# Patient Record
Sex: Female | Born: 1945 | Race: White | Hispanic: No | Smoking: Former smoker
Health system: Southern US, Community
[De-identification: ages and names within clinical notes are randomized; demographics above are authoritative.]

## PROBLEM LIST (undated history)

## (undated) ENCOUNTER — Emergency Department (HOSPITAL_COMMUNITY): Discharge status: Absent without leave | Payer: Medicare Other

## (undated) DIAGNOSIS — T8859XA Other complications of anesthesia, initial encounter: Secondary | ICD-10-CM

## (undated) DIAGNOSIS — Z9889 Other specified postprocedural states: Secondary | ICD-10-CM

## (undated) DIAGNOSIS — E039 Hypothyroidism, unspecified: Secondary | ICD-10-CM

## (undated) DIAGNOSIS — F329 Major depressive disorder, single episode, unspecified: Secondary | ICD-10-CM

## (undated) DIAGNOSIS — T4145XA Adverse effect of unspecified anesthetic, initial encounter: Secondary | ICD-10-CM

## (undated) DIAGNOSIS — F32A Depression, unspecified: Secondary | ICD-10-CM

## (undated) DIAGNOSIS — J45991 Cough variant asthma: Principal | ICD-10-CM

## (undated) DIAGNOSIS — M869 Osteomyelitis, unspecified: Secondary | ICD-10-CM

## (undated) DIAGNOSIS — M1990 Unspecified osteoarthritis, unspecified site: Secondary | ICD-10-CM

## (undated) DIAGNOSIS — R112 Nausea with vomiting, unspecified: Secondary | ICD-10-CM

## (undated) HISTORY — DX: Cough variant asthma: J45.991

## (undated) HISTORY — PX: HAND SURGERY: SHX662

---
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
## 1978-07-13 HISTORY — PX: TUBAL LIGATION: SHX77

## 1999-05-13 ENCOUNTER — Other Ambulatory Visit: Admission: RE | Admit: 1999-05-13 | Discharge: 1999-05-13 | Payer: Self-pay | Admitting: Obstetrics and Gynecology

## 2001-09-19 ENCOUNTER — Encounter: Payer: Self-pay | Admitting: Family Medicine

## 2001-09-19 ENCOUNTER — Encounter: Admission: RE | Admit: 2001-09-19 | Discharge: 2001-09-19 | Payer: Self-pay | Admitting: Family Medicine

## 2001-12-05 ENCOUNTER — Encounter: Admission: RE | Admit: 2001-12-05 | Discharge: 2001-12-05 | Payer: Self-pay | Admitting: Family Medicine

## 2001-12-05 ENCOUNTER — Encounter: Payer: Self-pay | Admitting: Family Medicine

## 2001-12-15 ENCOUNTER — Encounter: Admission: RE | Admit: 2001-12-15 | Discharge: 2001-12-15 | Payer: Self-pay | Admitting: Family Medicine

## 2001-12-15 ENCOUNTER — Encounter: Payer: Self-pay | Admitting: Family Medicine

## 2003-10-08 ENCOUNTER — Emergency Department (HOSPITAL_COMMUNITY): Admission: EM | Admit: 2003-10-08 | Discharge: 2003-10-08 | Payer: Self-pay | Admitting: Emergency Medicine

## 2003-10-09 ENCOUNTER — Emergency Department (HOSPITAL_COMMUNITY): Admission: EM | Admit: 2003-10-09 | Discharge: 2003-10-09 | Payer: Self-pay

## 2003-10-19 ENCOUNTER — Emergency Department (HOSPITAL_COMMUNITY): Admission: EM | Admit: 2003-10-19 | Discharge: 2003-10-19 | Payer: Self-pay | Admitting: Emergency Medicine

## 2003-10-30 ENCOUNTER — Encounter: Admission: RE | Admit: 2003-10-30 | Discharge: 2003-10-30 | Payer: Self-pay | Admitting: Family Medicine

## 2003-12-12 ENCOUNTER — Emergency Department (HOSPITAL_COMMUNITY): Admission: EM | Admit: 2003-12-12 | Discharge: 2003-12-12 | Payer: Self-pay | Admitting: Emergency Medicine

## 2003-12-14 ENCOUNTER — Inpatient Hospital Stay (HOSPITAL_COMMUNITY): Admission: AD | Admit: 2003-12-14 | Discharge: 2004-01-02 | Payer: Self-pay | Admitting: Family Medicine

## 2004-05-15 ENCOUNTER — Ambulatory Visit: Payer: Self-pay | Admitting: Cardiology

## 2004-06-13 ENCOUNTER — Ambulatory Visit: Payer: Self-pay | Admitting: Cardiology

## 2004-07-03 ENCOUNTER — Ambulatory Visit: Payer: Self-pay | Admitting: Cardiology

## 2004-07-31 ENCOUNTER — Ambulatory Visit: Payer: Self-pay | Admitting: Cardiology

## 2004-09-11 ENCOUNTER — Ambulatory Visit: Payer: Self-pay | Admitting: Cardiology

## 2004-11-06 ENCOUNTER — Ambulatory Visit: Payer: Self-pay | Admitting: Cardiology

## 2004-11-24 ENCOUNTER — Ambulatory Visit: Payer: Self-pay | Admitting: Cardiology

## 2005-02-24 ENCOUNTER — Ambulatory Visit: Payer: Self-pay | Admitting: Cardiology

## 2005-03-09 ENCOUNTER — Ambulatory Visit: Payer: Self-pay

## 2005-07-23 ENCOUNTER — Other Ambulatory Visit: Admission: RE | Admit: 2005-07-23 | Discharge: 2005-07-23 | Payer: Self-pay | Admitting: Obstetrics and Gynecology

## 2005-09-24 ENCOUNTER — Ambulatory Visit: Payer: Self-pay | Admitting: Cardiology

## 2005-10-30 ENCOUNTER — Encounter: Admission: RE | Admit: 2005-10-30 | Discharge: 2005-10-30 | Payer: Self-pay | Admitting: Family Medicine

## 2010-10-24 ENCOUNTER — Emergency Department (HOSPITAL_COMMUNITY): Payer: 59

## 2010-10-24 ENCOUNTER — Inpatient Hospital Stay (HOSPITAL_COMMUNITY)
Admission: EM | Admit: 2010-10-24 | Discharge: 2010-10-26 | DRG: 195 | Disposition: A | Discharge status: Home / Self Care | Payer: 59 | Attending: Internal Medicine | Admitting: Internal Medicine

## 2010-10-24 DIAGNOSIS — E039 Hypothyroidism, unspecified: Secondary | ICD-10-CM | POA: Diagnosis present

## 2010-10-24 DIAGNOSIS — A088 Other specified intestinal infections: Secondary | ICD-10-CM | POA: Diagnosis present

## 2010-10-24 DIAGNOSIS — J189 Pneumonia, unspecified organism: Principal | ICD-10-CM | POA: Diagnosis present

## 2010-10-24 DIAGNOSIS — E876 Hypokalemia: Secondary | ICD-10-CM | POA: Diagnosis present

## 2010-10-24 DIAGNOSIS — Z8701 Personal history of pneumonia (recurrent): Secondary | ICD-10-CM

## 2010-10-24 DIAGNOSIS — M069 Rheumatoid arthritis, unspecified: Secondary | ICD-10-CM | POA: Diagnosis present

## 2010-10-24 DIAGNOSIS — F411 Generalized anxiety disorder: Secondary | ICD-10-CM | POA: Diagnosis present

## 2010-10-24 DIAGNOSIS — R1013 Epigastric pain: Secondary | ICD-10-CM | POA: Diagnosis present

## 2010-10-24 LAB — CBC
Hemoglobin: 14 g/dL (ref 12.0–15.0)
MCH: 31.3 pg (ref 26.0–34.0)
Platelets: 150 10*3/uL (ref 150–400)
RBC: 4.48 MIL/uL (ref 3.87–5.11)
WBC: 11.8 10*3/uL — ABNORMAL HIGH (ref 4.0–10.5)

## 2010-10-24 LAB — DIFFERENTIAL
Basophils Absolute: 0 10*3/uL (ref 0.0–0.1)
Eosinophils Relative: 0 % (ref 0–5)
Lymphs Abs: 0.9 10*3/uL (ref 0.7–4.0)
Monocytes Absolute: 0.3 10*3/uL (ref 0.1–1.0)

## 2010-10-24 LAB — COMPREHENSIVE METABOLIC PANEL
ALT: 26 U/L (ref 0–35)
AST: 23 U/L (ref 0–37)
Albumin: 4.3 g/dL (ref 3.5–5.2)
Alkaline Phosphatase: 43 U/L (ref 39–117)
Calcium: 9.5 mg/dL (ref 8.4–10.5)
GFR calc Af Amer: >60 mL/min (ref >60)
Potassium: 3.6 mEq/L (ref 3.5–5.1)
Sodium: 138 mEq/L (ref 135–145)
Total Protein: 7.8 g/dL (ref 6.0–8.3)

## 2010-10-24 LAB — URINALYSIS, ROUTINE W REFLEX MICROSCOPIC
Glucose, UA: NEGATIVE mg/dL
Nitrite: NEGATIVE
Specific Gravity, Urine: 1.016 (ref 1.005–1.030)
pH: 7 (ref 5.0–8.0)

## 2010-10-24 LAB — LIPID PANEL
Cholesterol: 184 mg/dL (ref 0–200)
HDL: 63 mg/dL (ref >39)
Total CHOL/HDL Ratio: 2.9 RATIO
VLDL: 13 mg/dL (ref 0–40)

## 2010-10-24 LAB — LIPASE, BLOOD: Lipase: 44 U/L (ref 11–59)

## 2010-10-24 LAB — URINE MICROSCOPIC-ADD ON

## 2010-10-24 MED ORDER — IOHEXOL 300 MG/ML  SOLN
100.0000 mL | Freq: Once | INTRAMUSCULAR | Status: AC | PRN
  Administered 2010-10-24: 100 mL via INTRAVENOUS

## 2010-10-25 LAB — PHOSPHORUS: Phosphorus: 3.2 mg/dL (ref 2.3–4.6)

## 2010-10-25 LAB — DIFFERENTIAL
Eosinophils Absolute: 0.1 10*3/uL (ref 0.0–0.7)
Lymphs Abs: 2.2 10*3/uL (ref 0.7–4.0)
Monocytes Absolute: 0.6 10*3/uL (ref 0.1–1.0)
Monocytes Relative: 6 % (ref 3–12)
Neutrophils Relative %: 68 % (ref 43–77)

## 2010-10-25 LAB — COMPREHENSIVE METABOLIC PANEL
Alkaline Phosphatase: 31 U/L — ABNORMAL LOW (ref 39–117)
BUN: 10 mg/dL (ref 6–23)
Chloride: 110 mEq/L (ref 96–112)
Creatinine, Ser: 0.79 mg/dL (ref 0.4–1.2)
Glucose, Bld: 106 mg/dL — ABNORMAL HIGH (ref 70–99)
Potassium: 3.9 mEq/L (ref 3.5–5.1)
Total Bilirubin: 1.1 mg/dL (ref 0.3–1.2)

## 2010-10-25 LAB — CK TOTAL AND CKMB (NOT AT ARMC)
Relative Index: INVALID (ref 0.0–2.5)
Total CK: 34 U/L (ref 7–177)

## 2010-10-25 LAB — CBC
Hemoglobin: 11.8 g/dL — ABNORMAL LOW (ref 12.0–15.0)
MCH: 30.7 pg (ref 26.0–34.0)
MCHC: 34.2 g/dL (ref 30.0–36.0)
MCV: 89.8 fL (ref 78.0–100.0)
Platelets: 134 10*3/uL — ABNORMAL LOW (ref 150–400)
RBC: 3.84 MIL/uL — ABNORMAL LOW (ref 3.87–5.11)

## 2010-10-25 LAB — MAGNESIUM: Magnesium: 1.9 mg/dL (ref 1.5–2.5)

## 2010-10-30 NOTE — Discharge Summary (Signed)
[name], [name]                ACCOUNT NO.:  [id number]  MEDICAL RECORD NO.:  [id number]           PATIENT TYPE:  I  LOCATION:  1414                         FACILITY:  [facility]  PHYSICIAN:  Erick Blinks, MD     DATE OF BIRTH:  [birth year]  DATE OF ADMISSION:  10/24/2010 DATE OF DISCHARGE:  10/26/2010                              DISCHARGE SUMMARY   PRIMARY CARE PHYSICIAN:  Thayer Headings, MD  DISCHARGE DIAGNOSES: 1. Nausea, vomiting, and abdominal pain, likely secondary to viral     gastroenteritis. 2. Community-acquired pneumonia. 3. Hypokalemia. 4. History of rheumatoid arthritis. 5. Anxiety. 6. Hypothyroidism.  DISCHARGE MEDICATIONS: 1. Bentyl 20 mg p.o. q.6 h. 2. Citalopram 40 mg p.o. daily. 3. Synthroid 100 mcg 1 tablet p.o. q.a.m. 4. Humira 40 mg 1 injection subcutaneously weekly. 5. Vitamin D 2000 units 1 capsule p.o. every other day. 6. Calcium citrate over the counter 2 tablets p.o. t.i.d. 7. Methotrexate 2.5 mg 8 tablets p.o. weekly. 8. Xanax 1 mg half a tablet p.o. in the morning and one tablet at     bedtime. 9. Doxepin 10 mg p.o. nightly. 10.Ibuprofen 200 mg 4 tablets p.o. q.8 h. p.r.n. 11.Lotemax 0.5% 1 drop both eyes b.i.d. 12.Systane 1 drop both eyes b.i.d. p.r.n. 13.Avelox 400 mg p.o. daily.  ADMISSION HISTORY:  This is a 65 year old female with history of rheumatoid arthritis who presented to the emergency room with complaints of epigastric pain, nausea, and vomiting.  The patient was having significant vomiting, was unable to keep any food down, and had crampy abdominal pain.  CT scan of the abdomen and pelvis was done which ruled out any intra-abdominal abnormality, but did show a left lower quadrant infiltrate.  The patient was subsequently admitted for further evaluation.  For further details, please refer to the history and physical dictated by Dr. Adela Glimpse on April 13th.  HOSPITAL COURSE: 1. Abdominal pain.  This was likely secondary to  viral     gastroenteritis.  The patient's condition resolved with supportive     therapy.  She is tolerating a regular diet at this time.  She no     longer has any nausea, vomiting, or abdominal pain.  She is     ambulating without difficulty and is currently back to her     baseline.  She is requesting to be discharged home. 2. Pneumonia.  The patient was found to have pneumonia on her chest x-     ray and CT scan.  She was started on antibiotics.  She has been     afebrile.  We will complete the course of antibiotics. 3. The remainder of medical problems have remained stable.  CONSULTATIONS:  None.  PROCEDURES:  None.  DIAGNOSTIC IMAGING: 1. CT abdomen and pelvis on April 13th shows no acute intra-abdominal     or pelvic pathology, minimal patchy tree-in-bud type nodular     airspace opacities in the left lower lobe, generally indicating     small airway infectious etiology. 2. Chest x-ray on April 13th shows bronchitic and question     emphysematous changes with subtle left mid  lung infiltrate.  DISCHARGE INSTRUCTIONS:  The patient should follow up with her primary care physician in the next 1-2 weeks.  She should continue on a regular diet, conduct her activity as tolerated.  CONDITION AT TIME OF DISCHARGE:  Improved.  Plan was discussed with the patient who is in agreement.     Erick Blinks, MD     JM/MEDQ  D:  10/26/2010  T:  10/27/2010  Job:  [id number]  cc:   Thayer Headings, M.D. Fax: [phone]  Electronically Signed by Erick Blinks  on 10/30/2010 12:59:13 PM

## 2010-11-08 NOTE — H&P (Signed)
[name], [name]                ACCOUNT NO.:  [id number]  MEDICAL RECORD NO.:  [id number]           PATIENT TYPE:  E  LOCATION:  WLED                         FACILITY:  [facility]  PHYSICIAN:  Michiel Cowboy, MDDATE OF BIRTH:  [birth year]  DATE OF ADMISSION:  10/24/2010 DATE OF DISCHARGE:                             HISTORY & PHYSICAL   PRIMARY CARE PROVIDER:  Thayer Headings, MD, [facility].  CHIEF COMPLAINT:  Epigastric pain, nausea, and vomiting.  The patient is a 65 year old female with past medical history of rheumatoid arthritis for which she is taking Humira and methotrexate. At 4 o'clock in the morning, she developed nausea and vomiting and epigastric pain.  She presented to the emergency department with above- mentioned symptoms and LFTs were unremarkable except for slightly elevated total bilirubin and normal lipase.  CT scan of her abdomen and pelvis was done to rule out any intra-abdominal abnormality and her abdomen was normal, but it did show left lower quadrant infiltrate.  In the past, she had a history of pneumonia presenting in a similar pattern as well.  She still continued to have occasional epigastric pain and still nauseous.  Otherwise, she did not have any chest pains or shortness of breath, no cough.  She does feel slightly chilly, but no fever, otherwise review of systems is negative.  PAST MEDICAL HISTORY:  Significant for, 1. Rheumatoid arthritis. 2. Pneumonia. 3. Anxiety. 4. She also has history of iron deficiency anemia. 5. History of thrombocytopenia. 6. Hypothyroidism.  Of note in the past, she did have ARDS in 2005 with septic shock secondary to pneumonia.  SOCIAL HISTORY:  The patient does not smoke or drink or abuse drugs.  FAMILY HISTORY:  Only significant for atrial fibrillation.  ALLERGIES:  None.  MEDICATIONS: 1. Celexa 40 mg daily. 2. Synthroid 100 mcg daily. 3. Vitamin D 2000 units every other day. 4.  Calcium citrate over the counter 2 tablets 3 times a day. 5. Methotrexate 2.5 eight tablets on Wednesday. 6. Alprazolam, she takes 0.5 in the morning and 1 mg at bedtime. 7. Doxepin 10 mg at bedtime. 8. Ibuprofen as needed. 9. Lotemax 0.5% both eyes twice daily. 10.Systane to both eyes twice daily as needed for dry eyes. 11.Humira 40 mg subcutaneous weekly on Wednesdays.  PHYSICAL EXAMINATION:  VITAL SIGNS:  Temperature 97.9, blood pressure 127/79, pulse 89, respiration 18, saturating 100% on room air. GENERAL:  The patient appears to be currently in no acute distress. HEAD:  Nontraumatic.  Dry mucous membranes. LUNGS:  Very mild crackles at bases if any.  No wheezes appreciated. ABDOMEN:  Soft.  There is no reproducible tenderness to palpation. There is no right upper quadrant tenderness.  Murphy sign is negative. LOWER EXTREMITIES:  Without clubbing, cyanosis, or edema. NEUROLOGIC:  Grossly intact.  LABORATORY DATA:  White blood cell count 11.8, hemoglobin 14.  Sodium 138, potassium 3.6, creatinine 0.78.  Total bilirubin 1.85, but otherwise LFTs within normal limits.  Lipase 44.  UA showing 3-6 white blood cells, but otherwise unremarkable.  Chest x-ray showing left middle lung infiltrate.  CT scan of the abdomen and  pelvis also has left lower lobe infiltrate.  ASSESSMENT AND PLAN:  This is a 65 year old female with nausea, vomiting, abdominal pain which has actually a left lower lobe infiltrate on a chest x-ray. 1. Pneumonia.  We will admit and further evaluate.  We will start her     on Rocephin and azithromycin for community-acquired pneumonia.     Give her IV fluids.  She is currently not coughing, so we will not     attempt to obtain sputum culture.  She is not febrile, we will not     obtain blood cultures at this point.  If she becomes worse, we will     obtain so. 2. Epigastric pain.  This is very atypical, nonpleuritic, associated     nausea and vomiting.  I wonder if  she has some gastritis.  Make     sure to put on Protonix.  Given her age and very typical     presentation, for completion sake we will cycle cardiac markers,     although this is not necessarily substernal pain, but more of an     epigastric.  Obtain an EKG. 3. Slightly low potassium.  We will add potassium to fluids. 4. History of rheumatoid arthritis.  Taking Humira and methotrexate     put her at risk of infections.  I would have her discuss this with     her rheumatologist to see if there is any other options available     for her. 5. Prophylaxis.  Protonix and Lovenox.     Michiel Cowboy, MD     AVD/MEDQ  D:  10/24/2010  T:  10/25/2010  Job:  [id number]  cc:   Thayer Headings, M.D. Fax: [phone]  Electronically Signed by Therisa Doyne MD on 11/08/2010 09:40:09 PM

## 2013-09-12 ENCOUNTER — Other Ambulatory Visit: Payer: Self-pay

## 2013-09-12 DIAGNOSIS — Z1231 Encounter for screening mammogram for malignant neoplasm of breast: Secondary | ICD-10-CM

## 2013-09-25 ENCOUNTER — Ambulatory Visit
Admission: RE | Admit: 2013-09-25 | Discharge: 2013-09-25 | Disposition: A | Discharge status: Home / Self Care | Payer: Medicare Other | Source: Ambulatory Visit

## 2013-09-25 DIAGNOSIS — Z1231 Encounter for screening mammogram for malignant neoplasm of breast: Secondary | ICD-10-CM

## 2013-09-26 ENCOUNTER — Other Ambulatory Visit: Payer: Self-pay | Admitting: Internal Medicine

## 2013-09-26 DIAGNOSIS — R928 Other abnormal and inconclusive findings on diagnostic imaging of breast: Secondary | ICD-10-CM

## 2013-10-09 ENCOUNTER — Ambulatory Visit
Admission: RE | Admit: 2013-10-09 | Discharge: 2013-10-09 | Disposition: A | Discharge status: Home / Self Care | Payer: Medicare Other | Source: Ambulatory Visit | Attending: Internal Medicine | Admitting: Internal Medicine

## 2013-10-09 DIAGNOSIS — R928 Other abnormal and inconclusive findings on diagnostic imaging of breast: Secondary | ICD-10-CM

## 2014-07-09 ENCOUNTER — Emergency Department (HOSPITAL_COMMUNITY)
Admission: EM | Admit: 2014-07-09 | Discharge: 2014-07-10 | Disposition: A | Discharge status: Home / Self Care | Payer: Medicare Other | Attending: Emergency Medicine | Admitting: Emergency Medicine

## 2014-07-09 ENCOUNTER — Emergency Department (HOSPITAL_COMMUNITY): Payer: Medicare Other

## 2014-07-09 ENCOUNTER — Encounter (HOSPITAL_COMMUNITY): Payer: Self-pay | Admitting: Emergency Medicine

## 2014-07-09 DIAGNOSIS — R519 Headache, unspecified: Secondary | ICD-10-CM

## 2014-07-09 DIAGNOSIS — R030 Elevated blood-pressure reading, without diagnosis of hypertension: Secondary | ICD-10-CM | POA: Diagnosis not present

## 2014-07-09 DIAGNOSIS — R51 Headache: Secondary | ICD-10-CM | POA: Insufficient documentation

## 2014-07-09 DIAGNOSIS — R112 Nausea with vomiting, unspecified: Secondary | ICD-10-CM | POA: Diagnosis not present

## 2014-07-09 DIAGNOSIS — R197 Diarrhea, unspecified: Secondary | ICD-10-CM | POA: Insufficient documentation

## 2014-07-09 DIAGNOSIS — Z87891 Personal history of nicotine dependence: Secondary | ICD-10-CM | POA: Diagnosis not present

## 2014-07-09 DIAGNOSIS — G44209 Tension-type headache, unspecified, not intractable: Secondary | ICD-10-CM | POA: Diagnosis not present

## 2014-07-09 DIAGNOSIS — Z79899 Other long term (current) drug therapy: Secondary | ICD-10-CM | POA: Insufficient documentation

## 2014-07-09 LAB — CBC
HEMATOCRIT: 41.6 % (ref 36.0–46.0)
Hemoglobin: 13.7 g/dL (ref 12.0–15.0)
MCH: 29.8 pg (ref 26.0–34.0)
MCHC: 32.9 g/dL (ref 30.0–36.0)
MCV: 90.4 fL (ref 78.0–100.0)
Platelets: 214 10*3/uL (ref 150–400)
RBC: 4.6 MIL/uL (ref 3.87–5.11)
RDW: 13.3 % (ref 11.5–15.5)
WBC: 12.9 10*3/uL — ABNORMAL HIGH (ref 4.0–10.5)

## 2014-07-09 LAB — BASIC METABOLIC PANEL
Anion gap: 7 (ref 5–15)
BUN: 25 mg/dL — AB (ref 6–23)
CALCIUM: 9.1 mg/dL (ref 8.4–10.5)
CO2: 23 mmol/L (ref 19–32)
CREATININE: 0.61 mg/dL (ref 0.50–1.10)
Chloride: 109 mEq/L (ref 96–112)
Glucose, Bld: 117 mg/dL — ABNORMAL HIGH (ref 70–99)
Potassium: 3.9 mmol/L (ref 3.5–5.1)
Sodium: 139 mmol/L (ref 135–145)

## 2014-07-09 MED ORDER — DEXAMETHASONE SODIUM PHOSPHATE 10 MG/ML IJ SOLN
10.0000 mg | Freq: Once | INTRAMUSCULAR | Status: AC
  Administered 2014-07-09: 10 mg via INTRAVENOUS
  Filled 2014-07-09: qty 1

## 2014-07-09 MED ORDER — SODIUM CHLORIDE 0.9 % IV BOLUS (SEPSIS)
1000.0000 mL | Freq: Once | INTRAVENOUS | Status: AC
  Administered 2014-07-09: 1000 mL via INTRAVENOUS

## 2014-07-09 MED ORDER — METOCLOPRAMIDE HCL 5 MG/ML IJ SOLN
10.0000 mg | Freq: Once | INTRAMUSCULAR | Status: AC
  Administered 2014-07-09: 10 mg via INTRAVENOUS
  Filled 2014-07-09: qty 2

## 2014-07-09 MED ORDER — DIPHENHYDRAMINE HCL 50 MG/ML IJ SOLN
25.0000 mg | Freq: Once | INTRAMUSCULAR | Status: AC
  Administered 2014-07-09: 25 mg via INTRAVENOUS
  Filled 2014-07-09: qty 1

## 2014-07-09 MED ORDER — MAGNESIUM SULFATE IN D5W 10-5 MG/ML-% IV SOLN
1.0000 g | Freq: Once | INTRAVENOUS | Status: AC
  Administered 2014-07-09: 1 g via INTRAVENOUS
  Filled 2014-07-09: qty 100

## 2014-07-09 MED ORDER — HYDROMORPHONE HCL 1 MG/ML IJ SOLN
1.0000 mg | Freq: Once | INTRAMUSCULAR | Status: AC
  Administered 2014-07-09: 1 mg via INTRAVENOUS
  Filled 2014-07-09: qty 1

## 2014-07-09 NOTE — ED Notes (Signed)
Per EMS pt had a sudden onset of headache about an hour ago  Pt states pain is on the left side of her head  Pt has no hx of migraines  Pt had 2 episodes of vomiting on the way to the hospital

## 2014-07-09 NOTE — ED Notes (Signed)
Pt arrived to the ED with a complaint of a headache, emesis and diarrhea.  Pt states that she has had two episodes each of emesis and diarrhea,.  Pt's symptoms have been going on for a couple of hours.

## 2014-07-10 NOTE — ED Provider Notes (Signed)
CSN: [id number]     Arrival date & time 07/09/14  1957 History   First MD Initiated Contact with Patient 07/09/14 2056     Chief Complaint  Patient presents with  . Headache  . Emesis  . Diarrhea     (Consider location/radiation/quality/duration/timing/severity/associated sxs/prior Treatment) Patient is a 68 y.o. female presenting with headaches, vomiting, and diarrhea.  Headache Pain location:  Generalized Onset quality:  Sudden Timing:  Constant Progression:  Unchanged Chronicity:  New Similar to prior headaches: no   Context comment:  Spontaneously Relieved by:  Nothing Worsened by:  Nothing tried Associated symptoms: diarrhea, nausea and vomiting   Associated symptoms: no abdominal pain, no cough and no fever   Emesis Associated symptoms: diarrhea and headaches   Associated symptoms: no abdominal pain   Diarrhea Associated symptoms: headaches and vomiting   Associated symptoms: no abdominal pain and no fever     History reviewed. No pertinent past medical history. History reviewed. No pertinent past surgical history. History reviewed. No pertinent family history. History  Substance Use Topics  . Smoking status: Former Research scientist (life sciences)  . Smokeless tobacco: Never Used  . Alcohol Use: No   OB History    No data available     Review of Systems  Constitutional: Negative for fever.  Respiratory: Negative for cough and shortness of breath.   Cardiovascular: Negative for chest pain and leg swelling.  Gastrointestinal: Positive for nausea, vomiting and diarrhea. Negative for abdominal pain.  Neurological: Positive for headaches.  All other systems reviewed and are negative.     Allergies  Review of patient's allergies indicates no known allergies.  Home Medications   Prior to Admission medications   Medication Sig Start Date End Date Taking? Authorizing Provider  etanercept (ENBREL SURECLICK) 50 MG/ML injection Inject into the skin once a week.    Yes Historical  Provider, MD  ibandronate (BONIVA) 150 MG tablet Take 150 mg by mouth every 30 (thirty) days. Take in the morning with a full glass of water, on an empty stomach, and do not take anything else by mouth or lie down for the next 30 min.   Yes Historical Provider, MD  levothyroxine (SYNTHROID, LEVOTHROID) 100 MCG tablet Take 100 mcg by mouth daily before breakfast.   Yes Historical Provider, MD  methotrexate (RHEUMATREX) 2.5 MG tablet Take 20 mg by mouth once a week. Caution:Chemotherapy. Protect from light.   Yes Historical Provider, MD   BP 140/77 mmHg  Pulse 103  Temp(Src) 98.2 F (36.8 C) (Oral)  Resp 18  SpO2 94% Physical Exam  Constitutional: She is oriented to person, place, and time. She appears well-developed and well-nourished. No distress.  HENT:  Head: Normocephalic and atraumatic.  Mouth/Throat: Oropharynx is clear and moist.  Eyes: EOM are normal. Pupils are equal, round, and reactive to light.  Neck: Normal range of motion. Neck supple.  Cardiovascular: Normal rate and regular rhythm.  Exam reveals no friction rub.   No murmur heard. Pulmonary/Chest: Effort normal and breath sounds normal. No respiratory distress. She has no wheezes. She has no rales.  Abdominal: Soft. She exhibits no distension. There is no tenderness. There is no rebound.  Musculoskeletal: Normal range of motion. She exhibits no edema.  Neurological: She is alert and oriented to person, place, and time. No cranial nerve deficit. She exhibits normal muscle tone. Coordination normal.  Skin: No rash noted. She is not diaphoretic.  Nursing note and vitals reviewed.   ED Course  Procedures (including critical care  time) Labs Review Labs Reviewed  CBC - Abnormal; Notable for the following:    WBC 12.9 (*)    All other components within normal limits  BASIC METABOLIC PANEL - Abnormal; Notable for the following:    Glucose, Bld 117 (*)    BUN 25 (*)    All other components within normal limits     Imaging Review Ct Head Wo Contrast  07/10/2014   CLINICAL DATA:  Headache, nausea/vomiting  EXAM: CT HEAD WITHOUT CONTRAST  TECHNIQUE: Contiguous axial images were obtained from the base of the skull through the vertex without intravenous contrast.  COMPARISON:  None.  FINDINGS: No evidence of parenchymal hemorrhage or extra-axial fluid collection. No mass lesion, mass effect, or midline shift.  No CT evidence of acute infarction.  Mild small vessel ischemic changes.  Intracranial atherosclerosis.  Mild cortical atrophy.  No ventriculomegaly.  The visualized paranasal sinuses are essentially clear. The mastoid air cells are unopacified.  No evidence of calvarial fracture.  IMPRESSION: No evidence of acute intracranial abnormality.  Mild atrophy with small vessel ischemic changes and intracranial atherosclerosis.   Electronically Signed   By: Julian Hy M.D.   On: 07/10/2014 00:14     EKG Interpretation None      MDM   Final diagnoses:  Headache    17F here with sudden onset headache, nausea/vomiting. No hx of migraines. Was at rest when this began. AFVSS here. Patient in pain, neuro exam normal. Concern for [facility]. Headache began about 3 hours ago, CT will be sufficient to r/o SAH. After migraine cocktail and narcotics, patient feeling much better. CT normal. We spoke about possibility of LP, but she expressed sentiment that she did not want to do that. I do not feel LP is needed at this time since she has completely improved and CT was negative.  Stable for discharge.  I have reviewed all labs and imaging and considered them in my medical decision making.    Evelina Bucy, MD 07/10/14 [phone]

## 2014-07-10 NOTE — Discharge Instructions (Signed)

## 2014-08-07 DIAGNOSIS — M0589 Other rheumatoid arthritis with rheumatoid factor of multiple sites: Secondary | ICD-10-CM | POA: Diagnosis not present

## 2014-08-07 DIAGNOSIS — M810 Age-related osteoporosis without current pathological fracture: Secondary | ICD-10-CM | POA: Diagnosis not present

## 2014-08-07 DIAGNOSIS — M150 Primary generalized (osteo)arthritis: Secondary | ICD-10-CM | POA: Diagnosis not present

## 2014-08-07 DIAGNOSIS — M3500 Sicca syndrome, unspecified: Secondary | ICD-10-CM | POA: Diagnosis not present

## 2014-08-14 DIAGNOSIS — M0589 Other rheumatoid arthritis with rheumatoid factor of multiple sites: Secondary | ICD-10-CM | POA: Diagnosis not present

## 2014-09-11 DIAGNOSIS — M0589 Other rheumatoid arthritis with rheumatoid factor of multiple sites: Secondary | ICD-10-CM | POA: Diagnosis not present

## 2014-11-07 DIAGNOSIS — M0589 Other rheumatoid arthritis with rheumatoid factor of multiple sites: Secondary | ICD-10-CM | POA: Diagnosis not present

## 2014-11-08 DIAGNOSIS — M810 Age-related osteoporosis without current pathological fracture: Secondary | ICD-10-CM | POA: Diagnosis not present

## 2014-11-08 DIAGNOSIS — E039 Hypothyroidism, unspecified: Secondary | ICD-10-CM | POA: Diagnosis not present

## 2014-11-12 DIAGNOSIS — M810 Age-related osteoporosis without current pathological fracture: Secondary | ICD-10-CM | POA: Diagnosis not present

## 2014-11-12 DIAGNOSIS — M0589 Other rheumatoid arthritis with rheumatoid factor of multiple sites: Secondary | ICD-10-CM | POA: Diagnosis not present

## 2014-11-12 DIAGNOSIS — M3500 Sicca syndrome, unspecified: Secondary | ICD-10-CM | POA: Diagnosis not present

## 2014-11-12 DIAGNOSIS — M150 Primary generalized (osteo)arthritis: Secondary | ICD-10-CM | POA: Diagnosis not present

## 2014-11-15 DIAGNOSIS — M810 Age-related osteoporosis without current pathological fracture: Secondary | ICD-10-CM | POA: Diagnosis not present

## 2014-11-15 DIAGNOSIS — E039 Hypothyroidism, unspecified: Secondary | ICD-10-CM | POA: Diagnosis not present

## 2014-11-15 DIAGNOSIS — F339 Major depressive disorder, recurrent, unspecified: Secondary | ICD-10-CM | POA: Diagnosis not present

## 2014-11-15 DIAGNOSIS — J45909 Unspecified asthma, uncomplicated: Secondary | ICD-10-CM | POA: Diagnosis not present

## 2014-12-24 DIAGNOSIS — H35373 Puckering of macula, bilateral: Secondary | ICD-10-CM | POA: Diagnosis not present

## 2015-01-02 DIAGNOSIS — M0589 Other rheumatoid arthritis with rheumatoid factor of multiple sites: Secondary | ICD-10-CM | POA: Diagnosis not present

## 2015-01-07 DIAGNOSIS — H04123 Dry eye syndrome of bilateral lacrimal glands: Secondary | ICD-10-CM | POA: Diagnosis not present

## 2015-01-07 DIAGNOSIS — H16143 Punctate keratitis, bilateral: Secondary | ICD-10-CM | POA: Diagnosis not present

## 2015-01-07 DIAGNOSIS — H2513 Age-related nuclear cataract, bilateral: Secondary | ICD-10-CM | POA: Diagnosis not present

## 2015-01-07 DIAGNOSIS — H35371 Puckering of macula, right eye: Secondary | ICD-10-CM | POA: Diagnosis not present

## 2015-01-07 DIAGNOSIS — H35342 Macular cyst, hole, or pseudohole, left eye: Secondary | ICD-10-CM | POA: Diagnosis not present

## 2015-02-01 DIAGNOSIS — R112 Nausea with vomiting, unspecified: Secondary | ICD-10-CM | POA: Diagnosis not present

## 2015-02-12 DIAGNOSIS — D3709 Neoplasm of uncertain behavior of other specified sites of the oral cavity: Secondary | ICD-10-CM | POA: Diagnosis not present

## 2015-02-19 DIAGNOSIS — K1370 Unspecified lesions of oral mucosa: Secondary | ICD-10-CM | POA: Diagnosis not present

## 2015-02-27 DIAGNOSIS — M0589 Other rheumatoid arthritis with rheumatoid factor of multiple sites: Secondary | ICD-10-CM | POA: Diagnosis not present

## 2015-02-27 DIAGNOSIS — R5383 Other fatigue: Secondary | ICD-10-CM | POA: Diagnosis not present

## 2015-03-04 DIAGNOSIS — M3500 Sicca syndrome, unspecified: Secondary | ICD-10-CM | POA: Diagnosis not present

## 2015-03-04 DIAGNOSIS — M272 Inflammatory conditions of jaws: Secondary | ICD-10-CM | POA: Diagnosis not present

## 2015-03-04 DIAGNOSIS — M810 Age-related osteoporosis without current pathological fracture: Secondary | ICD-10-CM | POA: Diagnosis not present

## 2015-03-04 DIAGNOSIS — M150 Primary generalized (osteo)arthritis: Secondary | ICD-10-CM | POA: Diagnosis not present

## 2015-03-04 DIAGNOSIS — M0589 Other rheumatoid arthritis with rheumatoid factor of multiple sites: Secondary | ICD-10-CM | POA: Diagnosis not present

## 2015-03-08 DIAGNOSIS — E860 Dehydration: Secondary | ICD-10-CM | POA: Diagnosis not present

## 2015-03-08 DIAGNOSIS — R110 Nausea: Secondary | ICD-10-CM | POA: Diagnosis not present

## 2015-03-14 DIAGNOSIS — M272 Inflammatory conditions of jaws: Secondary | ICD-10-CM | POA: Diagnosis not present

## 2015-03-15 DIAGNOSIS — M2669 Other specified disorders of temporomandibular joint: Secondary | ICD-10-CM | POA: Diagnosis not present

## 2015-03-19 DIAGNOSIS — M272 Inflammatory conditions of jaws: Secondary | ICD-10-CM | POA: Diagnosis not present

## 2015-03-29 ENCOUNTER — Encounter: Payer: Self-pay | Admitting: Internal Medicine

## 2015-03-29 ENCOUNTER — Telehealth: Payer: Self-pay | Admitting: *Deleted

## 2015-03-29 DIAGNOSIS — F419 Anxiety disorder, unspecified: Secondary | ICD-10-CM | POA: Insufficient documentation

## 2015-03-29 DIAGNOSIS — M069 Rheumatoid arthritis, unspecified: Secondary | ICD-10-CM | POA: Insufficient documentation

## 2015-03-29 DIAGNOSIS — R739 Hyperglycemia, unspecified: Secondary | ICD-10-CM | POA: Insufficient documentation

## 2015-03-29 DIAGNOSIS — M272 Inflammatory conditions of jaws: Secondary | ICD-10-CM | POA: Insufficient documentation

## 2015-03-29 DIAGNOSIS — E039 Hypothyroidism, unspecified: Secondary | ICD-10-CM | POA: Insufficient documentation

## 2015-03-29 NOTE — Telephone Encounter (Signed)
Per Dr Megan Salon called the patient and gave her an appt for 04/01/15 at 10 am. Gave her the address and phone number.

## 2015-04-01 ENCOUNTER — Ambulatory Visit (INDEPENDENT_AMBULATORY_CARE_PROVIDER_SITE_OTHER): Payer: Medicare Other | Admitting: Internal Medicine

## 2015-04-01 ENCOUNTER — Encounter: Payer: Self-pay | Admitting: Internal Medicine

## 2015-04-01 DIAGNOSIS — M272 Inflammatory conditions of jaws: Secondary | ICD-10-CM

## 2015-04-01 DIAGNOSIS — B20 Human immunodeficiency virus [HIV] disease: Secondary | ICD-10-CM | POA: Diagnosis not present

## 2015-04-01 LAB — COMPREHENSIVE METABOLIC PANEL
ALT: 8 U/L (ref 6–29)
AST: 12 U/L (ref 10–35)
Albumin: 4.2 g/dL (ref 3.6–5.1)
Alkaline Phosphatase: 50 U/L (ref 33–130)
BILIRUBIN TOTAL: 0.5 mg/dL (ref 0.2–1.2)
BUN: 17 mg/dL (ref 7–25)
CO2: 26 mmol/L (ref 20–31)
Calcium: 9.6 mg/dL (ref 8.6–10.4)
Chloride: 105 mmol/L (ref 98–110)
Creat: 0.74 mg/dL (ref 0.50–0.99)
Glucose, Bld: 98 mg/dL (ref 65–99)
Potassium: 4.5 mmol/L (ref 3.5–5.3)
SODIUM: 141 mmol/L (ref 135–146)
Total Protein: 7.1 g/dL (ref 6.1–8.1)

## 2015-04-01 LAB — CBC
HCT: 37.1 % (ref 36.0–46.0)
Hemoglobin: 12 g/dL (ref 12.0–15.0)
MCH: 28.5 pg (ref 26.0–34.0)
MCHC: 32.3 g/dL (ref 30.0–36.0)
MCV: 88.1 fL (ref 78.0–100.0)
MPV: 10 fL (ref 8.6–12.4)
Platelets: 295 10*3/uL (ref 150–400)
RBC: 4.21 MIL/uL (ref 3.87–5.11)
RDW: 14.5 % (ref 11.5–15.5)
WBC: 8.3 10*3/uL (ref 4.0–10.5)

## 2015-04-01 LAB — C-REACTIVE PROTEIN: CRP: 0.7 mg/dL — ABNORMAL HIGH (ref <0.60)

## 2015-04-01 NOTE — Progress Notes (Signed)
Patient ID: [name], female   DOB: 11/26/1945, 69 y.o.   MRN: [id number]         [facility]  Reason for Consult: Mandibular osteomyelitis Referring Physician: Dr. Frederik Schmidt  Patient Active Problem List   Diagnosis Date Noted  . 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 Rheumatoid arthritis 03/29/2015  . Anxiety 03/29/2015  . Hypothyroidism 03/29/2015  . Hyperglycemia 03/29/2015    Patient's Medications  New Prescriptions   No medications on file  Previous Medications   ALENDRONATE (FOSAMAX) 70 MG TABLET    Take 70 mg by mouth once a week. Take with a full glass of water on an empty stomach.   CLINDAMYCIN (CLEOCIN) 300 MG CAPSULE    Take 300 mg by mouth 4 (four) times daily.   ETANERCEPT (ENBREL SURECLICK) 50 MG/ML INJECTION    Inject into the skin once a week.    FOLIC ACID (FOLVITE) 109 MCG TABLET    Take 800 mcg by mouth daily.   IBANDRONATE (BONIVA) 150 MG TABLET    Take 150 mg by mouth every 30 (thirty) days. Take in the morning with a full glass of water, on an empty stomach, and do not take anything else by mouth or lie down for the next 30 min.   IBUPROFEN (ADVIL,MOTRIN) 200 MG TABLET    Take 200 mg by mouth every 6 (six) hours as needed.   LEVOTHYROXINE (SYNTHROID, LEVOTHROID) 100 MCG TABLET    Take 100 mcg by mouth daily before breakfast.   MELATONIN 10 MG CAPS    Take 10 mg by mouth.   METHOTREXATE (RHEUMATREX) 2.5 MG TABLET    Take 20 mg by mouth once a week. Caution:Chemotherapy. Protect from light.   NONFORMULARY OR COMPOUNDED ITEM    every 8 (eight) weeks. [facility] every other month   RESTASIS 0.05 % OPHTHALMIC EMULSION      Modified Medications   No medications on file  Discontinued Medications   ALENDRONATE SODIUM (FOSAMAX PO)    Take 1 tablet by mouth once a week.    Recommendations: 1. Change clindamycin to ampicillin clavulanate 2. Brief tapering course of prednisone  3. Follow-up with me  next week  Assessment: Ms. Kuan has a polymicrobial dental infection following right mandibular molar extraction. She is had partial response to one month of antibiotic therapy but is still bothered by some swelling and numbness secondary to mandibular nerve inflammation. I do not think it is worth repeating a CT scan yet. I will switch her to Augmentin and give her a brief tapering course of prednisone to see if that helps her neuritis and numbness.   HPI: [name] is a 69 y.o. female who began having severe pain in her right posterior mandibular molar in early July. She underwent a root canal on 01/24/2015. She continued to have some discomfort and had part of the tooth ground down. The tooth then cracked and she was told that it was" getting ready for an abscess". The tooth was extracted on 02/12/2015. She developed some postoperative swelling. This worsened acutely after she received her every other month infusion of golimumab (which she takes for her rheumatoid arthritis) on 02/27/2015. She was started on empiric amoxicillin and referred to Dr. Benson Norway. A Panorex showed a significant density change in the bone involving the right anterior mandible. There was no obvious radiolucency or problems with her remaining teeth. There was intraoral firmness and fluctuance over  the right anterior mandible. An abscess was incised and drained and cultures grew multiple organisms. Amoxicillin was changed to clindamycin and metronidazole. The clindamycin was refilled on 03/21/2015 and she continues to take it. She had persistent intraoral drainage inside her right lower lip up until 3 weeks ago. She no longer has any pain but she continues to have swelling along the right jawline. It worsened this past weekend then seemed to improve over the past 2 days. She is bothered by numbness intraorally and along her right lower face. She has not had any fever, chills or sweats. She has not been eating well and has lost  weight. She had a follow-up CT scan on 03/14/2015. I reviewed that scan today with one of the radiologist from [facility]. It showed fluid and air in the right mandibular tooth socket where the tooth was removed. The fluid appears to track down and anteriorly in the nerve foramen. There was no cortical bone disruption. There was no abscess seen.  Review of Systems: Constitutional: positive for anorexia and weight loss, negative for chills, fevers and sweats Eyes: negative Ears, nose, mouth, throat, and face: as noted in history of present illness Respiratory: negative Cardiovascular: negative Gastrointestinal: negative Genitourinary:negative Integument/breast: negative Hematologic/lymphatic: negative  Musculoskeletal: Chronic, stable stiffness and aching in her joints, especially in her hands and wrists    No past medical history on file.  Social History  Substance Use Topics  . Smoking status: Former Research scientist (life sciences)  . Smokeless tobacco: Never Used  . Alcohol Use: No    No family history on file. Allergies  Allergen Reactions  . Remicade [Infliximab] Shortness Of Breath    OBJECTIVE: Filed Vitals:   04/01/15 1007  BP: 134/88  Pulse: 109  Temp: 98.2 F (36.8 C)  TempSrc: Oral  Height: 5' 2.5" (1.588 m)  Weight: 118 lb (53.524 kg)   Body mass index is 21.22 kg/(m^2).   General: She is thin and in no distress Skin: No rash Lymph nodes: No palpable adenopathy Oral: Her right posterior mandibular molar is absent. There is no fluctuance or tenderness noted there is a small white area on her right anterior gumline where she had been having drainage up until 3 weeks ago. No fluid is expressible. She has slightly firm swelling along the right jawline. There is no redness, tenderness or fluctuance. Lungs: Clear Cor: Regular S1 and S2 with no murmurs  Lab Results  Component Value Date   WBC 8.3 04/01/2015   HGB 12.0 04/01/2015   HCT 37.1 04/01/2015   MCV 88.1  04/01/2015   PLT 295 04/01/2015   CMP     Component Value Date/Time   NA 141 04/01/2015 1036   K 4.5 04/01/2015 1036   CL 105 04/01/2015 1036   CO2 26 04/01/2015 1036   GLUCOSE 98 04/01/2015 1036   BUN 17 04/01/2015 1036   CREATININE 0.74 04/01/2015 1036   CREATININE 0.61 07/09/2014 2208   CALCIUM 9.6 04/01/2015 1036   PROT 7.1 04/01/2015 1036   ALBUMIN 4.2 04/01/2015 1036   AST 12 04/01/2015 1036   ALT 8 04/01/2015 1036   ALKPHOS 50 04/01/2015 1036   BILITOT 0.5 04/01/2015 1036   GFRNONAA >90 07/09/2014 2208   GFRAA >90 07/09/2014 2208   SED RATE (mm/hr)  Date Value  04/01/2015 28   CRP (mg/dL)  Date Value  04/01/2015 0.7*   Microbiology: No results found for this or any previous visit (from the past 240 hour(s)).  Michel Bickers, MD [facility] [phone] pager   [phone] cell 04/01/2015, 11:11 AM

## 2015-04-02 ENCOUNTER — Telehealth: Payer: Self-pay | Admitting: *Deleted

## 2015-04-02 LAB — SEDIMENTATION RATE: SED RATE: 28 mm/h (ref 0–30)

## 2015-04-02 MED ORDER — AMOXICILLIN-POT CLAVULANATE 875-125 MG PO TABS: 1.0000 | ORAL_TABLET | Freq: Two times a day (BID) | ORAL | Status: DC

## 2015-04-02 MED ORDER — PREDNISONE 10 MG (21) PO TBPK: 10.0000 mg | ORAL_TABLET | Freq: Every day | ORAL | Status: DC

## 2015-04-02 NOTE — Addendum Note (Signed)
Addended by: Michel Bickers on: 04/02/2015 02:38 PM   Modules accepted: Orders

## 2015-04-02 NOTE — Telephone Encounter (Signed)
-----   Message from Michel Bickers, MD sent at 04/02/2015  2:00 PM EDT ----- Please make an appointment for Ms. Drewes at 10 AM on Wednesday, 04/10/2015 with me. Thanks. She does not need to be called. I just spoke with her and asked her to come in at that time.  Jenny Reichmann

## 2015-04-02 NOTE — Telephone Encounter (Signed)
Patient is scheduled for 9/28 at 10:15 (the only available slot).

## 2015-04-10 ENCOUNTER — Ambulatory Visit (INDEPENDENT_AMBULATORY_CARE_PROVIDER_SITE_OTHER): Payer: Medicare Other | Admitting: Internal Medicine

## 2015-04-10 ENCOUNTER — Encounter: Payer: Self-pay | Admitting: Internal Medicine

## 2015-04-10 VITALS — BP 130/81 | HR 97 | Temp 97.9°F | Ht 62.5 in | Wt 121.0 lb

## 2015-04-10 DIAGNOSIS — M272 Inflammatory conditions of jaws: Secondary | ICD-10-CM | POA: Diagnosis present

## 2015-04-10 NOTE — Addendum Note (Signed)
Addended by: Lorne Skeens D on: 04/10/2015 11:07 AM   Modules accepted: Orders, Medications

## 2015-04-10 NOTE — Progress Notes (Signed)
Patient ID: [name], female   DOB: 1946/02/26, 69 y.o.   MRN: [id number]         [facility] for Infectious Disease  Patient Active Problem List   Diagnosis Date Noted  . 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 Rheumatoid arthritis 03/29/2015  . Anxiety 03/29/2015  . Hypothyroidism 03/29/2015  . Hyperglycemia 03/29/2015    Patient's Medications  New Prescriptions   No medications on file  Previous Medications   ALENDRONATE (FOSAMAX) 70 MG TABLET    Take 70 mg by mouth once a week. Take with a full glass of water on an empty stomach.   ETANERCEPT (ENBREL SURECLICK) 50 MG/ML INJECTION    Inject into the skin once a week.    FOLIC ACID (FOLVITE) 244 MCG TABLET    Take 800 mcg by mouth daily.   IBANDRONATE (BONIVA) 150 MG TABLET    Take 150 mg by mouth every 30 (thirty) days. Take in the morning with a full glass of water, on an empty stomach, and do not take anything else by mouth or lie down for the next 30 min.   IBUPROFEN (ADVIL,MOTRIN) 200 MG TABLET    Take 200 mg by mouth every 6 (six) hours as needed.   LEVOTHYROXINE (SYNTHROID, LEVOTHROID) 100 MCG TABLET    Take 100 mcg by mouth daily before breakfast.   MELATONIN 10 MG CAPS    Take 10 mg by mouth.   METHOTREXATE (RHEUMATREX) 2.5 MG TABLET    Take 20 mg by mouth once a week. Caution:Chemotherapy. Protect from light.   NONFORMULARY OR COMPOUNDED ITEM    every 8 (eight) weeks. [facility] every other month   RESTASIS 0.05 % OPHTHALMIC EMULSION      Modified Medications   No medications on file  Discontinued Medications   AMOXICILLIN-CLAVULANATE (AUGMENTIN) 875-125 MG PER TABLET    Take 1 tablet by mouth 2 (two) times daily.   PREDNISONE (STERAPRED UNI-PAK 21 TAB) 10 MG (21) TBPK TABLET    Take 1 tablet (10 mg total) by mouth daily. Take 4 daily for 2 days, then 3 daily for 2 days, then 2 daily for 2 days, then 1 daily    Subjective: [name] is in for her routine follow-up visit. She has  now been on antibiotics for the past 6 weeks for postoperative dental abscess. I switched her to oral amoxicillin clavulanate 9 days ago. I also started a prednisone taper to see if that would help with her mandibular nerve inflammation. Unfortunately she is not feeling any better. About one week ago she had a small piece of what she thought was residual tooth work its way out from her gum near the right posterior mandibular molar that was extracted. That area remains "raw". She has not had any further drainage from the gumline anteriorly where she had drainage several weeks ago. She has persistent swelling and tenderness along her right jawline. The numbness of her right lower teeth and chin is unchanged. She is now having some stomach upset and frequent loose bowel movements.  Review of Systems: Pertinent items are noted in HPI.  No past medical history on file.  Social History  Substance Use Topics  . Smoking status: Former Research scientist (life sciences)  . Smokeless tobacco: Never Used  . Alcohol Use: No    No family history on file.  Allergies  Allergen Reactions  . Remicade [Infliximab] Shortness Of Breath    Objective: Filed Vitals:   04/10/15  1014  BP: 130/81  Pulse: 97  Temp: 97.9 F (36.6 C)  TempSrc: Oral  Height: 5' 2.5" (1.588 m)  Weight: 121 lb (54.885 kg)   Body mass index is 21.76 kg/(m^2).  General: She is frustrated Oral: I do not see any swelling or abnormalities of the gum in the socket where her mandibular molar was extracted on the right side. There is still a white spot on her gumline anteriorly where she had drainage. There is no expressible drainage today. She has diffuse swelling and tenderness along her right jawline  Lab Results Lab Results  Component Value Date   WBC 8.3 04/01/2015   HGB 12.0 04/01/2015   HCT 37.1 04/01/2015   MCV 88.1 04/01/2015   PLT 295 04/01/2015   CMP     Component Value Date/Time   NA 141 04/01/2015 1036   K 4.5 04/01/2015 1036   CL 105  04/01/2015 1036   CO2 26 04/01/2015 1036   GLUCOSE 98 04/01/2015 1036   BUN 17 04/01/2015 1036   CREATININE 0.74 04/01/2015 1036   CREATININE 0.61 07/09/2014 2208   CALCIUM 9.6 04/01/2015 1036   PROT 7.1 04/01/2015 1036   ALBUMIN 4.2 04/01/2015 1036   AST 12 04/01/2015 1036   ALT 8 04/01/2015 1036   ALKPHOS 50 04/01/2015 1036   BILITOT 0.5 04/01/2015 1036   GFRNONAA >90 07/09/2014 2208   GFRAA >90 07/09/2014 2208      Problem List Items Addressed This Visit      High   Osteomyelitis of mandible - Primary    She is showing no significant improvement despite 6 weeks of antibiotics for dental abscess and 9 days of prednisone for mandibular neuritis. Her CT 4 weeks ago showed fluid and air in the right mandibular tooth socket where the tooth was removed. The fluid appears to track down and anteriorly in the nerve foramen. There was no cortical bone disruption. I am concerned that she has residual abscess that is resistant to current antibiotic therapy and may have early osteomyelitis. I will repeat a contrasted CT scan. I suspect that her stomach upset and frequent bowel movements are related to her antibiotic therapy. I will stop amoxicillin clavulanate now. If she develops worsening diarrhea I will screen for C. difficile. I would like to keep her off of antibiotics in case she needs repeat abscess drainage. This will improve the yield of cultures. I will call her once the CT scan has been completed and read.          Michel Bickers, MD [facility] [phone] pager   [phone] cell 04/10/2015, 10:53 AM

## 2015-04-10 NOTE — Assessment & Plan Note (Signed)
She is showing no significant improvement despite 6 weeks of antibiotics for dental abscess and 9 days of prednisone for mandibular neuritis. Her CT 4 weeks ago showed fluid and air in the right mandibular tooth socket where the tooth was removed. The fluid appears to track down and anteriorly in the nerve foramen. There was no cortical bone disruption. I am concerned that she has residual abscess that is resistant to current antibiotic therapy and may have early osteomyelitis. I will repeat a contrasted CT scan. I suspect that her stomach upset and frequent bowel movements are related to her antibiotic therapy. I will stop amoxicillin clavulanate now. If she develops worsening diarrhea I will screen for C. difficile. I would like to keep her off of antibiotics in case she needs repeat abscess drainage. This will improve the yield of cultures. I will call her once the CT scan has been completed and read.

## 2015-04-13 HISTORY — PX: TOOTH EXTRACTION: SUR596

## 2015-04-15 ENCOUNTER — Ambulatory Visit: Payer: Medicare Other | Admitting: Infectious Diseases

## 2015-04-15 ENCOUNTER — Ambulatory Visit
Admission: RE | Admit: 2015-04-15 | Discharge: 2015-04-15 | Disposition: A | Discharge status: Home / Self Care | Payer: Medicare Other | Source: Ambulatory Visit | Attending: Internal Medicine | Admitting: Internal Medicine

## 2015-04-15 DIAGNOSIS — M272 Inflammatory conditions of jaws: Secondary | ICD-10-CM | POA: Diagnosis not present

## 2015-04-15 DIAGNOSIS — K047 Periapical abscess without sinus: Secondary | ICD-10-CM | POA: Diagnosis not present

## 2015-04-15 MED ORDER — IOPAMIDOL (ISOVUE-300) INJECTION 61%
75.0000 mL | Freq: Once | INTRAVENOUS | Status: AC | PRN
  Administered 2015-04-15: 75 mL via INTRAVENOUS

## 2015-04-16 DIAGNOSIS — M3500 Sicca syndrome, unspecified: Secondary | ICD-10-CM | POA: Diagnosis not present

## 2015-04-16 DIAGNOSIS — M810 Age-related osteoporosis without current pathological fracture: Secondary | ICD-10-CM | POA: Diagnosis not present

## 2015-04-16 DIAGNOSIS — M0589 Other rheumatoid arthritis with rheumatoid factor of multiple sites: Secondary | ICD-10-CM | POA: Diagnosis not present

## 2015-04-16 DIAGNOSIS — M150 Primary generalized (osteo)arthritis: Secondary | ICD-10-CM | POA: Diagnosis not present

## 2015-04-17 ENCOUNTER — Telehealth: Payer: Self-pay | Admitting: *Deleted

## 2015-04-17 ENCOUNTER — Telehealth: Payer: Self-pay | Admitting: Internal Medicine

## 2015-04-17 NOTE — Telephone Encounter (Signed)
Paged Dr Megan Salon and advised him of the patient situation and he asked that I call her back and let her know that he and Dr Neale Burly are going to review her CT, discuss what it shows and call her wilt recommendations. He also advised he understands if pain is an issue and she can not wait for the call and has to go to the ED but he would like for her to wait until he can call her. She advised she will wait for his call.

## 2015-04-17 NOTE — Telephone Encounter (Signed)
Patient called and wanted to be seen today because she has swelling in her chin and neck. She advised they are extremely swollen and she is in pain. Advised her the doctor is not in the office today but will get him a message. She advised she is not willing to wait and is going to the ED. Advised will still call the doctor and let him know what is going on.

## 2015-04-17 NOTE — Telephone Encounter (Signed)
I discussed the CT results with Dr.Owsley today. He agrees that she needs repeat surgery. He will see her in the office tomorrow. I asked him to submit specimens for Gram stain and culture. I will followup culture results and recommend antibiotic therapy.

## 2015-04-17 NOTE — Telephone Encounter (Signed)
[name] is now been off of empiric antibiotics for one week. She is having increasing aching pain in her right jaw associated with more swelling. Her CT scan on 04/25/2015 showed:  CT MAXILLOFACIAL WITH CONTRAST  TECHNIQUE: Multidetector CT imaging of the maxillofacial structures was performed with intravenous contrast. Multiplanar CT image reconstructions were also generated. A small metallic BB was placed on the right temple in order to reliably differentiate right from left.  CONTRAST: 54mL ISOVUE-300 IOPAMIDOL (ISOVUE-300) INJECTION 61%  COMPARISON: None.  FINDINGS: There is lucency noted in the right mandible in the region of prior extracted right lower molar, likely periapical abscess. In addition, there is mottled appearance within the right side of the mandible with small areas of cortical disruption and periosteal reaction anteriorly along the right side of the mandible. Findings are concerning for right mandibular osteomyelitis.  No additional acute bony abnormality. Paranasal sinuses are clear. Orbital soft tissues are unremarkable. There are mildly prominent bilateral cervical chain lymph nodes, likely reactive. Airways patent. No soft tissue fluid collection to suggest abscess. Mild soft tissue swelling over the right side of the mandible.  IMPRESSION: Large periapical abscess in the right mandible. Mottled appearance of the right mandible with areas of cortical destruction, periosteal reaction and overlying soft tissue swelling. Findings are concerning for possible osteomyelitis.   Electronically Signed  By: Rolm Baptise M.D.  On: 04/15/2015 17:15  I believe that she would benefit from further surgery and repeat Gram stain and cultures to help guide antibiotic therapy. I called and left a message with Dr. Frederik Schmidt, her oral surgeon. I will discuss options with him and then give her a call back.

## 2015-04-19 DIAGNOSIS — M272 Inflammatory conditions of jaws: Secondary | ICD-10-CM | POA: Diagnosis not present

## 2015-04-21 ENCOUNTER — Telehealth: Payer: Self-pay | Admitting: Internal Medicine

## 2015-04-21 NOTE — Telephone Encounter (Signed)
I spoke to Ms. Hehn by phone today. Her right jaw pain and swelling have improved since she underwent drainage of the abscess with drain placement 2 days ago. Her numbness is unchanged. The Gram stain of the dental tissue showed no organisms and rare white blood cells. Cultures are pending. She will continue amoxicillin clavulanate pending final culture results.

## 2015-04-23 ENCOUNTER — Telehealth: Payer: Self-pay | Admitting: Internal Medicine

## 2015-04-23 NOTE — Telephone Encounter (Signed)
[name] is feeling better with less pain and swelling along her right jaw. Her drain was removed today. 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 I instructed her to continue taking amoxicillin clavulanate. She will follow-up with me in 2 weeks.

## 2015-05-07 ENCOUNTER — Encounter: Payer: Self-pay | Admitting: Internal Medicine

## 2015-05-07 ENCOUNTER — Ambulatory Visit (INDEPENDENT_AMBULATORY_CARE_PROVIDER_SITE_OTHER): Payer: Medicare Other | Admitting: Internal Medicine

## 2015-05-07 DIAGNOSIS — M272 Inflammatory conditions of jaws: Secondary | ICD-10-CM | POA: Diagnosis not present

## 2015-05-07 LAB — C-REACTIVE PROTEIN: CRP: <0.5 mg/dL (ref <0.60)

## 2015-05-07 NOTE — Assessment & Plan Note (Signed)
She is showing clinical improvement on antibiotic therapy for a persistent dental abscess and early mandibular osteomyelitis. I will repeat her inflammatory markers today and plan on continuing amoxicillin clavulanate for at least 4 more weeks.

## 2015-05-07 NOTE — Progress Notes (Signed)
Patient ID: [name], female   DOB: 1945-11-10, 69 y.o.   MRN: [id number]         [facility] for Infectious Disease  Patient Active Problem List   Diagnosis Date Noted  . 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 Rheumatoid arthritis ([place]) 03/29/2015  . Anxiety 03/29/2015  . Hypothyroidism 03/29/2015  . Hyperglycemia 03/29/2015    Patient's Medications  New Prescriptions   No medications on file  Previous Medications   ALENDRONATE (FOSAMAX) 70 MG TABLET    Take 70 mg by mouth once a week. Take with a full glass of water on an empty stomach.   AMOXICILLIN-CLAVULANATE (AUGMENTIN) 875-125 MG TABLET    Take 1 tablet by mouth 2 (two) times daily.   ETANERCEPT (ENBREL SURECLICK) 50 MG/ML INJECTION    Inject into the skin once a week.    FOLIC ACID (FOLVITE) 563 MCG TABLET    Take 800 mcg by mouth daily.   IBANDRONATE (BONIVA) 150 MG TABLET    Take 150 mg by mouth every 30 (thirty) days. Take in the morning with a full glass of water, on an empty stomach, and do not take anything else by mouth or lie down for the next 30 min.   IBUPROFEN (ADVIL,MOTRIN) 200 MG TABLET    Take 200 mg by mouth every 6 (six) hours as needed.   LEVOTHYROXINE (SYNTHROID, LEVOTHROID) 100 MCG TABLET    Take 100 mcg by mouth daily before breakfast.   MELATONIN 10 MG CAPS    Take 10 mg by mouth.   METHOTREXATE (RHEUMATREX) 2.5 MG TABLET    Take 20 mg by mouth once a week. Caution:Chemotherapy. Protect from light.   NONFORMULARY OR COMPOUNDED ITEM    every 8 (eight) weeks. [facility] every other month   RESTASIS 0.05 % OPHTHALMIC EMULSION      Modified Medications   No medications on file  Discontinued Medications   No medications on file    Subjective: [name] is in for her routine follow-up visit. Her recent CT scan showed a persistent periapical abscess where she had had a right mandibular molar removed several months ago. There was also signs of early osteomyelitis of  the mandible. She had drainage of the abscess on 04/19/2015. Abscess cultures grew streptococci. She started back on amoxicillin clavulanate and has now been back on therapy for 16 days. She's had no trouble tolerating her antibiotic. The pain and swelling along her right jaw has improved. She continues to have some yellow drainage from underneath her chin on the right side where her surgical drain had been. She is having difficulty remembering dates. She is not sure when she last saw Dr. Benson [place]. She has not had any fever, chills or sweats. She has not noted any improvement in the numbness of her right shin and gums. She states that this makes it very difficult for her to eat.  Review of Systems: Pertinent items are noted in HPI.  No past medical history on file.  Social History  Substance Use Topics  . Smoking status: Former Research scientist (life sciences)  . Smokeless tobacco: Never Used  . Alcohol Use: No    No family history on file.  Allergies  Allergen Reactions  . Remicade [Infliximab] Shortness Of Breath    Objective: Filed Vitals:   05/07/15 0952  BP: 143/91  Pulse: 84  Temp: 98.1 F (36.7 C)  TempSrc: Oral  Height: 5' 2.5" (1.588 m)  Weight: 119 lb (  53.978 kg)   Body mass index is 21.41 kg/(m^2).  General: She is in no distress Oral: No intraoral swelling or drainage. No oropharyngeal lesions. The swelling and tenderness along her right mandible has resolved. She still has a little bit of erythema and swelling on the right side of her chin. There is some yellow drainage from a small sinus tract underneath her chin on the right side. The drainage is yellow and without odor Mood: She is very upset when talking about the numbness and states that she doesn't care if the infection gets better if it's not going to make the numbness go away  Lab Results SED RATE (mm/hr)  Date Value  04/01/2015 28   CRP (mg/dL)  Date Value  04/01/2015 0.7*   Dental abscess 58/03/9832: microaerophilic   Streptococci  Maxillofacial CT scan 04/15/2015  IMPRESSION: Large periapical abscess in the right mandible. Mottled appearance of the right mandible with areas of cortical destruction, periosteal reaction and overlying soft tissue swelling. Findings are concerning for possible osteomyelitis.   Electronically Signed  By: Rolm Baptise M.D.  On: 04/15/2015 17:15   Problem List Items Addressed This Visit      High   Osteomyelitis of mandible    She is showing clinical improvement on antibiotic therapy for a persistent dental abscess and early mandibular osteomyelitis. I will repeat her inflammatory markers today and plan on continuing amoxicillin clavulanate for at least 4 more weeks.          Michel Bickers, MD [facility] [phone] pager   [phone] cell 05/07/2015, 10:21 AM

## 2015-05-08 LAB — SEDIMENTATION RATE: Sed Rate: 9 mm/hr (ref 0–30)

## 2015-05-21 DIAGNOSIS — F339 Major depressive disorder, recurrent, unspecified: Secondary | ICD-10-CM | POA: Diagnosis not present

## 2015-05-21 DIAGNOSIS — M810 Age-related osteoporosis without current pathological fracture: Secondary | ICD-10-CM | POA: Diagnosis not present

## 2015-05-21 DIAGNOSIS — Z1389 Encounter for screening for other disorder: Secondary | ICD-10-CM | POA: Diagnosis not present

## 2015-05-21 DIAGNOSIS — E039 Hypothyroidism, unspecified: Secondary | ICD-10-CM | POA: Diagnosis not present

## 2015-05-21 DIAGNOSIS — Z0000 Encounter for general adult medical examination without abnormal findings: Secondary | ICD-10-CM | POA: Diagnosis not present

## 2015-05-28 DIAGNOSIS — M810 Age-related osteoporosis without current pathological fracture: Secondary | ICD-10-CM | POA: Diagnosis not present

## 2015-05-28 DIAGNOSIS — F17211 Nicotine dependence, cigarettes, in remission: Secondary | ICD-10-CM | POA: Diagnosis not present

## 2015-05-28 DIAGNOSIS — F339 Major depressive disorder, recurrent, unspecified: Secondary | ICD-10-CM | POA: Diagnosis not present

## 2015-05-28 DIAGNOSIS — E039 Hypothyroidism, unspecified: Secondary | ICD-10-CM | POA: Diagnosis not present

## 2015-05-28 DIAGNOSIS — N182 Chronic kidney disease, stage 2 (mild): Secondary | ICD-10-CM | POA: Diagnosis not present

## 2015-05-29 ENCOUNTER — Encounter: Payer: Self-pay | Admitting: Internal Medicine

## 2015-06-04 ENCOUNTER — Ambulatory Visit (INDEPENDENT_AMBULATORY_CARE_PROVIDER_SITE_OTHER): Payer: Medicare Other | Admitting: Internal Medicine

## 2015-06-04 ENCOUNTER — Encounter: Payer: Self-pay | Admitting: Internal Medicine

## 2015-06-04 VITALS — BP 128/84 | HR 87 | Temp 98.0°F | Ht 62.5 in | Wt 117.0 lb

## 2015-06-04 DIAGNOSIS — M272 Inflammatory conditions of jaws: Secondary | ICD-10-CM | POA: Diagnosis not present

## 2015-06-04 MED ORDER — AMOXICILLIN-POT CLAVULANATE 875-125 MG PO TABS: 1.0000 | ORAL_TABLET | Freq: Two times a day (BID) | ORAL | Status: DC

## 2015-06-04 NOTE — Progress Notes (Signed)
[place] for Infectious Disease  Patient Active Problem List   Diagnosis Date Noted  . 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 Rheumatoid arthritis ([place]) 03/29/2015  . Anxiety 03/29/2015  . Hypothyroidism 03/29/2015  . Hyperglycemia 03/29/2015    Patient's Medications  New Prescriptions   No medications on file  Previous Medications   ALENDRONATE (FOSAMAX) 70 MG TABLET    Take 70 mg by mouth once a week. Take with a full glass of water on an empty stomach.   ETANERCEPT (ENBREL SURECLICK) 50 MG/ML INJECTION    Inject into the skin once a week.    FOLIC ACID (FOLVITE) [id number] MCG TABLET    Take 800 mcg by mouth daily.   IBANDRONATE (BONIVA) 150 MG TABLET    Take 150 mg by mouth every 30 (thirty) days. Take in the morning with a full glass of water, on an empty stomach, and do not take anything else by mouth or lie down for the next 30 min.   IBUPROFEN (ADVIL,MOTRIN) 200 MG TABLET    Take 200 mg by mouth every 6 (six) hours as needed.   LEVOTHYROXINE (SYNTHROID, LEVOTHROID) 100 MCG TABLET    Take 88 mcg by mouth daily before breakfast.    MELATONIN 10 MG CAPS    Take 10 mg by mouth.   METHOTREXATE (RHEUMATREX) 2.5 MG TABLET    Take 20 mg by mouth once a week. Caution:Chemotherapy. Protect from light.   NONFORMULARY OR COMPOUNDED ITEM    every 8 (eight) weeks. [facility] every other month   RESTASIS 0.05 % OPHTHALMIC EMULSION      Modified Medications   Modified Medication Previous Medication   AMOXICILLIN-CLAVULANATE (AUGMENTIN) 875-125 MG TABLET amoxicillin-clavulanate (AUGMENTIN) 875-125 MG tablet      Take 1 tablet by mouth 2 (two) times daily.    Take 1 tablet by mouth 2 (two) times daily.  Discontinued Medications   No medications on file    Subjective: [name] is in for her routine follow-up visit. She states that she is doing a little bit better than she was one month ago. The swelling under her jaw has resolved completely  following incision and drainage of her dental abscess on 04/19/2015. She is still having a small amount of intermittent drainage from one area on her right chin but the amount of drainage is much less over the past month. She actually only has drainage when she presses on that area. She also has some aching in her right jaw which is new over the past few weeks. She has not noted any improvement in her numbness. She says that the amoxicillin clavulanate adversely affects her appetite but she is not having any nausea, vomiting, abdominal pain or diarrhea. She is now completed 46 days of antibiotic therapy following her incision and drainage procedure. Cultures at that time grew microaerophilic streptococci. She had a previous 6 week course of antibiotic therapy before her surgery.  Review of Systems: Review of Systems  Constitutional: Negative for fever, chills, weight loss and diaphoresis.  HENT: Negative for sore throat.   Psychiatric/Behavioral:       She is in better spirits today.    No past medical history on file.  Social History  Substance Use Topics  . Smoking status: Former Research scientist (life sciences)  . Smokeless tobacco: Never Used  . Alcohol Use: No    No family history on file.  Allergies  Allergen Reactions  .  Remicade [Infliximab] Shortness Of Breath    Objective: Filed Vitals:   06/04/15 1614  BP: 128/84  Pulse: 87  Temp: 98 F (36.7 C)  TempSrc: Oral  Height: 5' 2.5" (1.588 m)  Weight: 117 lb (53.071 kg)   Body mass index is 21.05 kg/(m^2).  Physical Exam  Constitutional:  She is calm and well dressed. She is smiling some today which is an improvement over previous exams.  HENT:  Head:    Mouth/Throat: No oropharyngeal exudate.  The swelling and fluctuance under her right jawline has resolved. She has no tenderness to palpation.    Lab Results SED RATE (mm/hr)  Date Value  05/07/2015 9  04/01/2015 28   CRP (mg/dL)  Date Value  05/07/2015 <0.5  04/01/2015 0.7*      Problem List Items Addressed This Visit      High   Osteomyelitis of mandible - Primary    She is making some slow progress but I'm still concerned about some continued, intermittent drainage. I discussed options with her and we both agree that she will continue amoxicillin clavulanate for a few more weeks and then return here for reevaluation. If she continues to have any drainage and/or worsening jaw pain I will repeat her CT scan at that time.          Michel Bickers, MD [facility] [place] [facility] [phone] pager   [phone] cell 06/04/2015, 4:52 PM

## 2015-06-04 NOTE — Assessment & Plan Note (Signed)
She is making some slow progress but I'm still concerned about some continued, intermittent drainage. I discussed options with her and we both agree that she will continue amoxicillin clavulanate for a few more weeks and then return here for reevaluation. If she continues to have any drainage and/or worsening jaw pain I will repeat her CT scan at that time.

## 2015-06-10 ENCOUNTER — Telehealth: Payer: Self-pay

## 2015-06-10 NOTE — Telephone Encounter (Signed)
Patient informed of need for office visit on 06-11-15 @ 10 :30 . Laverle Patter, RN

## 2015-06-10 NOTE — Telephone Encounter (Signed)
Message left on patients voicemail to call the office to collect more information regarding her jaw swelling.

## 2015-06-10 NOTE — Telephone Encounter (Signed)
Please ask her to come in and see me tomorrow morning at 10:30 AM.

## 2015-06-10 NOTE — Telephone Encounter (Signed)
Patient calling regarding swelling returned to jaw on Saturday. She is still taking antibiotic but is concerned .

## 2015-06-11 ENCOUNTER — Encounter: Payer: Self-pay | Admitting: Internal Medicine

## 2015-06-11 ENCOUNTER — Ambulatory Visit (INDEPENDENT_AMBULATORY_CARE_PROVIDER_SITE_OTHER): Payer: Medicare Other | Admitting: Internal Medicine

## 2015-06-11 DIAGNOSIS — M272 Inflammatory conditions of jaws: Secondary | ICD-10-CM

## 2015-06-11 NOTE — Progress Notes (Addendum)
[facility] for [facility]ious Disease  Patient Active Problem List   Diagnosis Date Noted  . 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 Rheumatoid arthritis ([place]) 03/29/2015  . Anxiety 03/29/2015  . Hypothyroidism 03/29/2015  . Hyperglycemia 03/29/2015    Patient's Medications  New Prescriptions   No medications on file  Previous Medications   FOLIC ACID (FOLVITE) Q000111Q MCG TABLET    Take 800 mcg by mouth daily.   IBUPROFEN (ADVIL,MOTRIN) 200 MG TABLET    Take 200 mg by mouth every 6 (six) hours as needed.   LEVOTHYROXINE (SYNTHROID, LEVOTHROID) 100 MCG TABLET    Take 88 mcg by mouth daily before breakfast.    MELATONIN 10 MG CAPS    Take 10 mg by mouth.   NONFORMULARY OR COMPOUNDED ITEM    every 8 (eight) weeks. [facility] every other month   RESTASIS 0.05 % OPHTHALMIC EMULSION      Modified Medications   No medications on file  Discontinued Medications   ALENDRONATE (FOSAMAX) 70 MG TABLET    Take 70 mg by mouth once a week. Take with a full glass of water on an empty stomach.   AMOXICILLIN-CLAVULANATE (AUGMENTIN) 875-125 MG TABLET    Take 1 tablet by mouth 2 (two) times daily.   ETANERCEPT (ENBREL SURECLICK) 50 MG/ML INJECTION    Inject into the skin once a week.    IBANDRONATE (BONIVA) 150 MG TABLET    Take 150 mg by mouth every 30 (thirty) days. Take in the morning with a full glass of water, on an empty stomach, and do not take anything else by mouth or lie down for the next 30 min.   METHOTREXATE (RHEUMATREX) 2.5 MG TABLET    Take 20 mg by mouth once a week. Caution:Chemotherapy. Protect from light.    Subjective: [name] is seen on a work in basis. She called yesterday stating that she has noted renewed swelling along her right jawline over the past week. She continues to take amoxicillin clavulanate. She is now completed 53 days of antibiotic therapy since she underwent incision and drainage. She received a prior 6 week antibiotic  course before the surgery. After she saw me one week ago she noted that the small sinus tract on her right chin finally stopped draining. Several days later she noted some puffiness along the right jawline and could notice a whole opening inside her gumline.  Review of Systems: Review of Systems  Constitutional: Negative for fever, chills and diaphoresis.  HENT:       She does have increased, diffuse swelling along her right jawline. There is no redness or fluctuance. It is only minimally tender. I cannot see the external sinus tract that had been present last week. She does have a pinpoint opening inside her gumline on the right side anteriorly. I cannot express any drainage.    No past medical history on file.  Social History  Substance Use Topics  . Smoking status: Former Research scientist (life sciences)  . Smokeless tobacco: Never Used  . Alcohol Use: No    No family history on file.  Allergies  Allergen Reactions  . Remicade [Infliximab] Shortness Of Breath    Objective: Filed Vitals:   06/11/15 1037  BP: 123/82  Pulse: 87  Temp: 98.1 F (36.7 C)  Weight: 116 lb (52.617 kg)   Body mass index is 20.87 kg/(m^2).  Physical Exam  Lab Results    Problem List  Items Addressed This Visit      High   Osteomyelitis of mandible    She continues to have new problems despite 2 long courses of antibiotic therapy targeting the microaerophilic streptococci isolated the time of her incision and drainage nearly 2 months ago. I will have her stop amoxicillin clavulanate now and repeat her maxillofacial CT scan. I would also like to have her follow-up with Dr. Benson Norway to get his opinion on next steps. She will follow-up with me in 2 weeks.      Relevant Orders   CT Maxillofacial W/Cm       Michel Bickers, MD [facility] for [facility] [phone] pager   [phone] cell 06/11/2015, 10:56 AM    Addendum:  CT MAXILLOFACIAL WITH CONTRAST  06/13/2015  TECHNIQUE: Multidetector CT imaging of the maxillofacial structures was performed with intravenous contrast. Multiplanar CT image reconstructions were also generated. A small metallic BB was placed on the right temple in order to reliably differentiate right from left.  CONTRAST: 39mL OMNIPAQUE IOHEXOL 300 MG/ML SOLN  COMPARISON: 04/15/2015  FINDINGS: As noted on a prior CT, there are irregular lucencies throughout the right mandibular body extending from an absent posterior molar anteriorly to the level of the right canine. There several areas of cortical disruption with lucency extending to through the cortex. One such area along the anterior aspect of the right mandible, below the canine tooth, has mildly increased in size from prior study. There is surrounding sclerotic change consistent with reactive bone. Mild lucency surrounds the roots of right canine through the remaining posterior right molar.  There is no associated soft tissue abscess. Soft tissue inflammatory changes along the right mandible have improved from the prior CT.  There are no other areas suspicious for 10 to related abscess or osteomyelitis.  There are no soft tissue masses or pathologically enlarged lymph nodes.  Limited intracranial evaluation is unremarkable. Normal globes and orbits. Sinuses, mastoid air cells and middle ear cavities are clear. Airway is widely patent.  IMPRESSION: 1. The appearance of the right mandibular body is similar to the prior CT, with multiple areas of lucency and adjacent reactive sclerosis, as well as several areas of cortical disruption. Findings are most suggestive of acute on chronic osteomyelitis. 2. There is no associated soft tissue abscess. Soft tissue inflammatory change has improved from the prior CT. 3. No other significant findings. No masses or adenopathy.   Electronically Signed  By: Lajean Manes M.D.  On: 06/13/2015  17:39  I discussed the results of the CT scan with [name] today. She has chronic right mandibular osteomyelitis. I will have a PICC placed and start IV ertapenem. She is in agreement. I would also like her to follow-up with Dr. Benson Norway so he can examine the new intraoral sinus tract that has opened up. She will follow-up with me in one week.  Michel Bickers, MD [facility]: [facility] [phone] pager   [phone] cell 06/17/2015, 12:57 PM

## 2015-06-11 NOTE — Assessment & Plan Note (Signed)
She continues to have new problems despite 2 long courses of antibiotic therapy targeting the microaerophilic streptococci isolated the time of her incision and drainage nearly 2 months ago. I will have her stop amoxicillin clavulanate now and repeat her maxillofacial CT scan. I would also like to have her follow-up with Dr. Benson Norway to get his opinion on next steps. She will follow-up with me in 2 weeks.

## 2015-06-13 ENCOUNTER — Encounter (HOSPITAL_COMMUNITY): Payer: Self-pay

## 2015-06-13 ENCOUNTER — Ambulatory Visit (HOSPITAL_COMMUNITY)
Admission: RE | Admit: 2015-06-13 | Discharge: 2015-06-13 | Disposition: A | Discharge status: Home / Self Care | Payer: Medicare Other | Source: Ambulatory Visit | Attending: Internal Medicine | Admitting: Internal Medicine

## 2015-06-13 DIAGNOSIS — M272 Inflammatory conditions of jaws: Secondary | ICD-10-CM | POA: Insufficient documentation

## 2015-06-13 MED ORDER — IOHEXOL 300 MG/ML  SOLN
100.0000 mL | Freq: Once | INTRAMUSCULAR | Status: AC | PRN
  Administered 2015-06-13: 75 mL via INTRAVENOUS

## 2015-06-13 MED ORDER — IOHEXOL 300 MG/ML  SOLN: 75.0000 mL | Freq: Once | INTRAMUSCULAR | Status: DC | PRN

## 2015-06-17 ENCOUNTER — Telehealth: Payer: Self-pay | Admitting: *Deleted

## 2015-06-17 ENCOUNTER — Other Ambulatory Visit: Payer: Self-pay | Admitting: Internal Medicine

## 2015-06-17 DIAGNOSIS — M272 Inflammatory conditions of jaws: Secondary | ICD-10-CM

## 2015-06-17 MED ORDER — ERTAPENEM SODIUM 1 G IV SOLR: 1.0000 g | Freq: Every day | INTRAVENOUS | Status: DC

## 2015-06-17 MED ORDER — ERTAPENEM SODIUM 1 G IJ SOLR: 1.0000 g | INTRAMUSCULAR | Status: DC

## 2015-06-17 NOTE — Telephone Encounter (Addendum)
Setting up PICC placement and infusion appointments.  PICC placement is scheduled for Wed., Dec. 7 at 3 PM.  [facility] will see her directly following PICC placement for her first dose of Invanz.  IV Invanz for a total of 6 weeks per Dr. Megan Salon.  [facility] is not open on the weekends, so, other arrangements will need to be made for these days and the upcoming holidays.

## 2015-06-17 NOTE — Telephone Encounter (Signed)
Pt has Medicare and is ambulatory, working part-time.  Pt will not be able to obtain [facility] benefits due to being ambulatory.  RN will look into ambulatory infusion centers for administration of daily IV antibiotics for 6 weeks.  Will change IV antibiotic orders to inpatient administration.

## 2015-06-17 NOTE — Addendum Note (Signed)
Addended by: Lorne Skeens D on: 06/17/2015 02:42 PM   Modules accepted: Orders

## 2015-06-18 NOTE — Telephone Encounter (Signed)
RN having some difficulty arranging 6 weeks of antibiotic infusion for the pt.  Have calls in to [facility], [facility] and [facility].  Waiting on return calls.

## 2015-06-19 ENCOUNTER — Ambulatory Visit (HOSPITAL_COMMUNITY)
Admission: RE | Admit: 2015-06-19 | Discharge: 2015-06-19 | Disposition: A | Discharge status: Home / Self Care | Payer: Medicare Other | Source: Ambulatory Visit | Attending: Internal Medicine | Admitting: Internal Medicine

## 2015-06-19 ENCOUNTER — Other Ambulatory Visit: Payer: Self-pay | Admitting: Internal Medicine

## 2015-06-19 DIAGNOSIS — M272 Inflammatory conditions of jaws: Secondary | ICD-10-CM

## 2015-06-19 DIAGNOSIS — Z452 Encounter for adjustment and management of vascular access device: Secondary | ICD-10-CM | POA: Diagnosis not present

## 2015-06-19 LAB — CBC WITH DIFFERENTIAL/PLATELET
BASOS PCT: 1 %
Basophils Absolute: 0.1 10*3/uL (ref 0.0–0.1)
EOS ABS: 0.9 10*3/uL — AB (ref 0.0–0.7)
Eosinophils Relative: 12 %
HCT: 31.5 % — ABNORMAL LOW (ref 36.0–46.0)
HEMOGLOBIN: 9.9 g/dL — AB (ref 12.0–15.0)
Lymphocytes Relative: 27 %
Lymphs Abs: 2.1 10*3/uL (ref 0.7–4.0)
MCH: 25.6 pg — ABNORMAL LOW (ref 26.0–34.0)
MCHC: 31.4 g/dL (ref 30.0–36.0)
MCV: 81.6 fL (ref 78.0–100.0)
Monocytes Absolute: 0.6 10*3/uL (ref 0.1–1.0)
Monocytes Relative: 7 %
NEUTROS PCT: 53 %
Neutro Abs: 4.1 10*3/uL (ref 1.7–7.7)
PLATELETS: 254 10*3/uL (ref 150–400)
RBC: 3.86 MIL/uL — AB (ref 3.87–5.11)
RDW: 12.7 % (ref 11.5–15.5)
WBC: 7.9 10*3/uL (ref 4.0–10.5)

## 2015-06-19 LAB — C-REACTIVE PROTEIN: CRP: <0.5 mg/dL (ref <1.0)

## 2015-06-19 LAB — BASIC METABOLIC PANEL
ANION GAP: 6 (ref 5–15)
BUN: 30 mg/dL — ABNORMAL HIGH (ref 6–20)
CHLORIDE: 109 mmol/L (ref 101–111)
CO2: 25 mmol/L (ref 22–32)
Calcium: 8.9 mg/dL (ref 8.9–10.3)
Creatinine, Ser: 1.03 mg/dL — ABNORMAL HIGH (ref 0.44–1.00)
GFR calc Af Amer: >60 mL/min (ref >60)
GFR, EST NON AFRICAN AMERICAN: 55 mL/min — AB (ref >60)
GLUCOSE: 86 mg/dL (ref 65–99)
POTASSIUM: 3.8 mmol/L (ref 3.5–5.1)
SODIUM: 140 mmol/L (ref 135–145)

## 2015-06-19 LAB — SEDIMENTATION RATE: SED RATE: 23 mm/h — AB (ref 0–22)

## 2015-06-19 MED ORDER — HEPARIN SOD (PORK) LOCK FLUSH 100 UNIT/ML IV SOLN
250.0000 [IU] | INTRAVENOUS | Status: AC | PRN
  Administered 2015-06-19: 250 [IU]
  Filled 2015-06-19: qty 5

## 2015-06-19 MED ORDER — SODIUM CHLORIDE 0.9 % IJ SOLN
10.0000 mL | INTRAMUSCULAR | Status: AC | PRN
  Administered 2015-06-19: 10 mL

## 2015-06-19 MED ORDER — LIDOCAINE HCL 1 % IJ SOLN
INTRAMUSCULAR | Status: AC
  Filled 2015-06-19: qty 20

## 2015-06-19 MED ORDER — ERTAPENEM SODIUM 1 G IJ SOLR
1.0000 g | Freq: Every day | INTRAMUSCULAR | Status: DC
  Administered 2015-06-19: 1 g via INTRAVENOUS
  Filled 2015-06-19: qty 1

## 2015-06-19 NOTE — Procedures (Signed)
[facility]  Procedure Note  IFE CRAPSER [id number] DOB: [birth year] DOA: 06/19/2015   PCP: Pcp Not In System   Associated Diagnosis: Osteomyelitis of mandible  Procedure Note: IV infusion of Invanz; lab draw   Condition During Procedure:  Pt tolerated well   Condition at Discharge:  No complications noted   Nigel Sloop, [facility]

## 2015-06-19 NOTE — Procedures (Signed)
Successful placement of single lumen PICC line to right basilic vein. Length 38cm Tip at lower SVC/RA No complications Ready for use.  Ascencion Dike PA-C 4:34 PM

## 2015-06-20 ENCOUNTER — Ambulatory Visit (HOSPITAL_COMMUNITY)
Admission: RE | Admit: 2015-06-20 | Discharge: 2015-06-20 | Disposition: A | Discharge status: Home / Self Care | Payer: Medicare Other | Source: Ambulatory Visit | Attending: Internal Medicine | Admitting: Internal Medicine

## 2015-06-20 DIAGNOSIS — M272 Inflammatory conditions of jaws: Secondary | ICD-10-CM | POA: Diagnosis not present

## 2015-06-20 MED ORDER — SODIUM CHLORIDE 0.9 % IV SOLN
1.0000 g | Freq: Once | INTRAVENOUS | Status: AC
  Administered 2015-06-20: 1 g via INTRAVENOUS
  Filled 2015-06-20: qty 1

## 2015-06-20 MED ORDER — SODIUM CHLORIDE 0.9 % IJ SOLN
10.0000 mL | INTRAMUSCULAR | Status: DC | PRN
  Administered 2015-06-20: 10 mL
  Filled 2015-06-20: qty 10

## 2015-06-20 MED ORDER — HEPARIN SOD (PORK) LOCK FLUSH 100 UNIT/ML IV SOLN
250.0000 [IU] | INTRAVENOUS | Status: AC | PRN
  Administered 2015-06-20: 250 [IU]
  Filled 2015-06-20: qty 5

## 2015-06-20 NOTE — Progress Notes (Signed)
[facility]  Procedure Note   [name] [id number] DOB: 1946/05/08 DOA: 06/20/2015  PCP: Pcp Not In System   Associated Diagnosis: Osteomyelitis of mandible   Procedure Note: IV infusion of Invanz, Blood returned noted from PICC, PICC flushed with heparin per order post infusion  Condition During Procedure: Pt tolerated well   Condition at Discharge: No complications noted   Harden Mo, [facility]

## 2015-06-21 ENCOUNTER — Ambulatory Visit (HOSPITAL_COMMUNITY)
Admission: RE | Admit: 2015-06-21 | Discharge: 2015-06-21 | Disposition: A | Discharge status: Home / Self Care | Payer: Medicare Other | Source: Ambulatory Visit | Attending: Internal Medicine | Admitting: Internal Medicine

## 2015-06-21 DIAGNOSIS — M272 Inflammatory conditions of jaws: Secondary | ICD-10-CM | POA: Diagnosis not present

## 2015-06-21 MED ORDER — SODIUM CHLORIDE 0.9 % IJ SOLN: 10.0000 mL | INTRAMUSCULAR | Status: DC | PRN

## 2015-06-21 MED ORDER — SODIUM CHLORIDE 0.9 % IV SOLN
1.0000 g | INTRAVENOUS | Status: DC
  Administered 2015-06-21: 1 g via INTRAVENOUS
  Filled 2015-06-21: qty 1

## 2015-06-21 MED ORDER — HEPARIN SOD (PORK) LOCK FLUSH 100 UNIT/ML IV SOLN
250.0000 [IU] | INTRAVENOUS | Status: AC | PRN
  Administered 2015-06-21: 250 [IU]
  Filled 2015-06-21: qty 5

## 2015-06-21 NOTE — Progress Notes (Signed)
  [name] [id number] DOB: September 19, 1945 DOA: 06/19/2015   PCP: Pcp Not In System   Associated Diagnosis: Osteomyelitis of mandible  Procedure Note: IV infusion of Invanz  Patient tolerated infusion well.  Rt PICC line flushed,  no complaints verbalized. Patient ambulated out of Center.

## 2015-06-22 ENCOUNTER — Encounter (HOSPITAL_COMMUNITY)
Admission: RE | Admit: 2015-06-22 | Discharge: 2015-06-22 | Disposition: A | Discharge status: Home / Self Care | Payer: Medicare Other | Source: Ambulatory Visit | Attending: Internal Medicine | Admitting: Internal Medicine

## 2015-06-22 DIAGNOSIS — M272 Inflammatory conditions of jaws: Secondary | ICD-10-CM | POA: Insufficient documentation

## 2015-06-22 MED ORDER — HEPARIN SOD (PORK) LOCK FLUSH 100 UNIT/ML IV SOLN
250.0000 [IU] | INTRAVENOUS | Status: AC | PRN
  Administered 2015-06-22: 250 [IU]

## 2015-06-22 MED ORDER — SODIUM CHLORIDE 0.9 % IV SOLN
1.0000 g | Freq: Once | INTRAVENOUS | Status: AC
  Administered 2015-06-22: 1 g via INTRAVENOUS
  Filled 2015-06-22: qty 1

## 2015-06-22 NOTE — Progress Notes (Signed)
  [name] [id number] DOB: September 01, 1945 DOA: 06/22/2015   PCP: Pcp Not In System   Associated Diagnosis: Osteomyelitis of mandible  Procedure Note:  PICC line with good blood return, V infusion of Invanz without complications, PICC flushed with heparin per order  Pt tolerated infusion without complications, ambulated out of hospital without difficulty

## 2015-06-23 ENCOUNTER — Encounter (HOSPITAL_COMMUNITY)
Admission: RE | Admit: 2015-06-23 | Discharge: 2015-06-23 | Disposition: A | Discharge status: Home / Self Care | Payer: Medicare Other | Source: Ambulatory Visit | Attending: Internal Medicine | Admitting: Internal Medicine

## 2015-06-23 DIAGNOSIS — M272 Inflammatory conditions of jaws: Secondary | ICD-10-CM | POA: Diagnosis not present

## 2015-06-23 MED ORDER — SODIUM CHLORIDE 0.9 % IV SOLN
1.0000 g | Freq: Once | INTRAVENOUS | Status: AC
  Administered 2015-06-23: 1 g via INTRAVENOUS
  Filled 2015-06-23: qty 1

## 2015-06-24 ENCOUNTER — Ambulatory Visit (HOSPITAL_COMMUNITY)
Admission: RE | Admit: 2015-06-24 | Discharge: 2015-06-24 | Disposition: A | Discharge status: Home / Self Care | Payer: Medicare Other | Source: Ambulatory Visit | Attending: Internal Medicine | Admitting: Internal Medicine

## 2015-06-24 DIAGNOSIS — H35341 Macular cyst, hole, or pseudohole, right eye: Secondary | ICD-10-CM | POA: Diagnosis not present

## 2015-06-24 DIAGNOSIS — H35373 Puckering of macula, bilateral: Secondary | ICD-10-CM | POA: Diagnosis not present

## 2015-06-24 DIAGNOSIS — M272 Inflammatory conditions of jaws: Secondary | ICD-10-CM | POA: Diagnosis not present

## 2015-06-24 MED ORDER — SODIUM CHLORIDE 0.9 % IJ SOLN
10.0000 mL | INTRAMUSCULAR | Status: AC | PRN
  Administered 2015-06-24: 10 mL

## 2015-06-24 MED ORDER — HEPARIN SOD (PORK) LOCK FLUSH 100 UNIT/ML IV SOLN
250.0000 [IU] | INTRAVENOUS | Status: AC | PRN
  Administered 2015-06-24: 250 [IU]
  Filled 2015-06-24: qty 5

## 2015-06-24 MED ORDER — SODIUM CHLORIDE 0.9 % IV SOLN
1.0000 g | Freq: Once | INTRAVENOUS | Status: AC
  Administered 2015-06-24: 1 g via INTRAVENOUS
  Filled 2015-06-24: qty 1

## 2015-06-24 NOTE — Progress Notes (Signed)
[name] [id number] DOB: [birth year] DOA: 06/22/2015   PCP: Pcp Not In System   Associated Diagnosis: Osteomyelitis of mandible  Procedure Note:IV infusion of Invanz without complications, PICC flushed with heparin per order  Pt tolerated infusion without complications, ambulated out of hospital without difficulty

## 2015-06-25 ENCOUNTER — Ambulatory Visit (INDEPENDENT_AMBULATORY_CARE_PROVIDER_SITE_OTHER): Payer: Medicare Other | Admitting: Internal Medicine

## 2015-06-25 ENCOUNTER — Ambulatory Visit (HOSPITAL_COMMUNITY)
Admission: RE | Admit: 2015-06-25 | Discharge: 2015-06-25 | Disposition: A | Discharge status: Home / Self Care | Payer: Medicare Other | Source: Ambulatory Visit | Attending: Internal Medicine | Admitting: Internal Medicine

## 2015-06-25 ENCOUNTER — Encounter: Payer: Self-pay | Admitting: Internal Medicine

## 2015-06-25 VITALS — BP 136/82 | HR 93 | Temp 98.1°F | Wt 115.0 lb

## 2015-06-25 DIAGNOSIS — M272 Inflammatory conditions of jaws: Secondary | ICD-10-CM | POA: Diagnosis not present

## 2015-06-25 MED ORDER — SODIUM CHLORIDE 0.9 % IV SOLN
1.0000 g | Freq: Once | INTRAVENOUS | Status: AC
  Administered 2015-06-25: 1 g via INTRAVENOUS
  Filled 2015-06-25: qty 1

## 2015-06-25 MED ORDER — HEPARIN SOD (PORK) LOCK FLUSH 100 UNIT/ML IV SOLN
500.0000 [IU] | INTRAVENOUS | Status: DC | PRN
  Filled 2015-06-25: qty 5

## 2015-06-25 MED ORDER — SODIUM CHLORIDE 0.9 % IJ SOLN: 10.0000 mL | INTRAMUSCULAR | Status: DC | PRN

## 2015-06-25 MED ORDER — HEPARIN SOD (PORK) LOCK FLUSH 100 UNIT/ML IV SOLN
250.0000 [IU] | INTRAVENOUS | Status: AC | PRN
  Administered 2015-06-25: 250 [IU]

## 2015-06-25 MED ORDER — SODIUM CHLORIDE 0.9 % IJ SOLN
10.0000 mL | Freq: Once | INTRAMUSCULAR | Status: AC
  Administered 2015-06-25: 10 mL via INTRAVENOUS

## 2015-06-25 NOTE — Progress Notes (Addendum)
[name] [id number] DOB: [birth year] DOA: 06/25/2015  PCP: Pcp Not In System   Associated Diagnosis: Osteomyelitis of mandible  Procedure Note:IV infusion of Invanz without complications, PICC flushed with heparin per order  Pt tolerated infusion without complications, ambulated out of hospital without difficulty

## 2015-06-25 NOTE — Assessment & Plan Note (Signed)
It is too soon to expect significant improvement in her mandibular osteomyelitis. She's only been on ertapenem for 1 week. I will see her back in 5 weeks.

## 2015-06-25 NOTE — Progress Notes (Signed)
         [facility] for Infectious Disease  Patient Active Problem List   Diagnosis Date Noted  . 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 Rheumatoid arthritis ([place]) 03/29/2015  . Anxiety 03/29/2015  . Hypothyroidism 03/29/2015  . Hyperglycemia 03/29/2015    Patient's Medications  New Prescriptions   No medications on file  Previous Medications   FOLIC ACID (FOLVITE) Q000111Q MCG TABLET    Take 800 mcg by mouth daily.   IBUPROFEN (ADVIL,MOTRIN) 200 MG TABLET    Take 200 mg by mouth every 6 (six) hours as needed.   LEVOTHYROXINE (SYNTHROID, LEVOTHROID) 100 MCG TABLET    Take 88 mcg by mouth daily before breakfast.    MELATONIN 10 MG CAPS    Take 10 mg by mouth.   NONFORMULARY OR COMPOUNDED ITEM    every 8 (eight) weeks. [facility] every other month   RESTASIS 0.05 % OPHTHALMIC EMULSION      Modified Medications   No medications on file  Discontinued Medications   No medications on file    Subjective: This family is in for her routine follow-up visit. She is now completed one week of her IV ertapenem therapy. She's had no problems tolerating her PICC or ertapenem. A small sinus tract inside her right lower gumline closed up recently. Therefore, she did not go to her visit with Dr. Benson Norway. A few days ago she started to have drainage from underneath the right side of her chin. She noticed that it was swollen and she pressed on the area and blood and pus came out. She continues to have aching pain in her right jaw that is unchanged.  Review of Systems: Review of Systems  Constitutional: Negative for fever, chills and diaphoresis.  HENT:       As noted in history of present illness.  Gastrointestinal: Negative for nausea, vomiting and diarrhea.    No past medical history on file.  Social History  Substance Use Topics  . Smoking status: Former Research scientist (life sciences)  . Smokeless tobacco: Never Used  . Alcohol Use: No    No family history on file.  Allergies    Allergen Reactions  . Remicade [Infliximab] Shortness Of Breath    Objective: Filed Vitals:   06/25/15 1434  BP: 136/82  Pulse: 93  Temp: 98.1 F (36.7 C)  TempSrc: Oral  Weight: 115 lb (52.164 kg)   Body mass index is 20.69 kg/(m^2).  Physical Exam  Constitutional: No distress.  HENT:  Mouth/Throat: No oropharyngeal exudate.  A small opening in her right lower gumline has closed. There is a new opening underneath her right chin. I cannot express any drainage.    Lab Results SED RATE (mm/hr)  Date Value  06/19/2015 23*  05/07/2015 9  04/01/2015 28   CRP (mg/dL)  Date Value  06/19/2015 <0.5  05/07/2015 <0.5  04/01/2015 0.7*     Problem List Items Addressed This Visit      High   Osteomyelitis of mandible - Primary    It is too soon to expect significant improvement in her mandibular osteomyelitis. She's only been on ertapenem for 1 week. I will see her back in 5 weeks.          Michel Bickers, MD [facility] for [facility] [phone] pager   [phone] cell 06/25/2015, 3:09 PM

## 2015-06-26 ENCOUNTER — Ambulatory Visit (HOSPITAL_COMMUNITY)
Admission: RE | Admit: 2015-06-26 | Discharge: 2015-06-26 | Disposition: A | Discharge status: Home / Self Care | Payer: Medicare Other | Source: Ambulatory Visit | Attending: Internal Medicine | Admitting: Internal Medicine

## 2015-06-26 DIAGNOSIS — M272 Inflammatory conditions of jaws: Secondary | ICD-10-CM | POA: Diagnosis not present

## 2015-06-26 LAB — BASIC METABOLIC PANEL
Anion gap: 7 (ref 5–15)
BUN: 28 mg/dL — AB (ref 6–20)
CALCIUM: 9.1 mg/dL (ref 8.9–10.3)
CO2: 24 mmol/L (ref 22–32)
CREATININE: 0.71 mg/dL (ref 0.44–1.00)
Chloride: 108 mmol/L (ref 101–111)
GFR calc Af Amer: >60 mL/min (ref >60)
Glucose, Bld: 83 mg/dL (ref 65–99)
POTASSIUM: 3.9 mmol/L (ref 3.5–5.1)
SODIUM: 139 mmol/L (ref 135–145)

## 2015-06-26 LAB — CBC
HCT: 32.1 % — ABNORMAL LOW (ref 36.0–46.0)
Hemoglobin: 10.4 g/dL — ABNORMAL LOW (ref 12.0–15.0)
MCH: 26.1 pg (ref 26.0–34.0)
MCHC: 32.4 g/dL (ref 30.0–36.0)
MCV: 80.7 fL (ref 78.0–100.0)
PLATELETS: 231 10*3/uL (ref 150–400)
RBC: 3.98 MIL/uL (ref 3.87–5.11)
RDW: 12.5 % (ref 11.5–15.5)
WBC: 7.8 10*3/uL (ref 4.0–10.5)

## 2015-06-26 LAB — C-REACTIVE PROTEIN: CRP: <0.5 mg/dL (ref <1.0)

## 2015-06-26 LAB — SEDIMENTATION RATE: SED RATE: 38 mm/h — AB (ref 0–22)

## 2015-06-26 MED ORDER — ERTAPENEM SODIUM 1 G IJ SOLR
1.0000 g | Freq: Once | INTRAMUSCULAR | Status: AC
  Administered 2015-06-26: 1 g via INTRAVENOUS
  Filled 2015-06-26: qty 1

## 2015-06-26 MED ORDER — SODIUM CHLORIDE 0.9 % IJ SOLN: 10.0000 mL | INTRAMUSCULAR | Status: DC | PRN

## 2015-06-26 MED ORDER — HEPARIN SOD (PORK) LOCK FLUSH 100 UNIT/ML IV SOLN: 250.0000 [IU] | INTRAVENOUS | Status: DC | PRN

## 2015-06-27 ENCOUNTER — Ambulatory Visit (HOSPITAL_COMMUNITY)
Admission: RE | Admit: 2015-06-27 | Discharge: 2015-06-27 | Disposition: A | Discharge status: Home / Self Care | Payer: Medicare Other | Source: Ambulatory Visit | Attending: Internal Medicine | Admitting: Internal Medicine

## 2015-06-27 DIAGNOSIS — M272 Inflammatory conditions of jaws: Secondary | ICD-10-CM | POA: Diagnosis not present

## 2015-06-27 MED ORDER — SODIUM CHLORIDE 0.9 % IJ SOLN: 10.0000 mL | INTRAMUSCULAR | Status: DC | PRN

## 2015-06-27 MED ORDER — ERTAPENEM SODIUM 1 G IJ SOLR
1.0000 g | Freq: Once | INTRAMUSCULAR | Status: AC
  Administered 2015-06-27: 1 g via INTRAVENOUS
  Filled 2015-06-27: qty 1

## 2015-06-27 MED ORDER — HEPARIN SOD (PORK) LOCK FLUSH 100 UNIT/ML IV SOLN
250.0000 [IU] | INTRAVENOUS | Status: AC | PRN
  Administered 2015-06-27: 250 [IU]
  Filled 2015-06-27: qty 5

## 2015-06-27 NOTE — Progress Notes (Addendum)
CARON CARREON [id number] DOB: Dec 14, 1945 DOA: 06/26/2015  PCP: Pcp Not In System   Associated Diagnosis: Osteomyelitis of mandible  Procedure Note:  IV infusion of Invanz without complications, labs drawn before infusion. PICC flushed with heparin per order. Dressing changed  Pt tolerated infusion without complications, ambulated out of hospital without difficulty

## 2015-06-27 NOTE — Progress Notes (Signed)
[name] [id number] DOB: 1946/07/03 DOA: 06/27/2015  PCP: Pcp Not In System   Associated Diagnosis: Osteomyelitis of mandible  Procedure Note:IV infusion of Invanz without complications, PICC flushed with heparin per order  Pt tolerated infusion without complications, ambulated out of hospital without difficulty

## 2015-06-28 ENCOUNTER — Ambulatory Visit (HOSPITAL_COMMUNITY)
Admission: RE | Admit: 2015-06-28 | Discharge: 2015-06-28 | Disposition: A | Discharge status: Home / Self Care | Payer: Medicare Other | Source: Ambulatory Visit | Attending: Internal Medicine | Admitting: Internal Medicine

## 2015-06-28 DIAGNOSIS — M272 Inflammatory conditions of jaws: Secondary | ICD-10-CM | POA: Diagnosis not present

## 2015-06-28 MED ORDER — SODIUM CHLORIDE 0.9 % IV SOLN
1.0000 g | Freq: Once | INTRAVENOUS | Status: AC
  Administered 2015-06-28: 1 g via INTRAVENOUS
  Filled 2015-06-28: qty 1

## 2015-06-28 MED ORDER — HEPARIN SOD (PORK) LOCK FLUSH 100 UNIT/ML IV SOLN
250.0000 [IU] | INTRAVENOUS | Status: AC | PRN
  Administered 2015-06-28: 250 [IU]
  Filled 2015-06-28: qty 5

## 2015-06-28 MED ORDER — SODIUM CHLORIDE 0.9 % IJ SOLN
10.0000 mL | INTRAMUSCULAR | Status: AC | PRN
  Administered 2015-06-28: 10 mL

## 2015-06-28 NOTE — Progress Notes (Signed)
[name] [id number] DOB: April 17, 1946 DOA: 06/28/2015  PCP: Pcp Not In System   Associated Diagnosis: Osteomyelitis of mandible  Procedure Note:IV infusion of Invanz without complications, PICC flushed with heparin per order  Pt tolerated infusion without complications, ambulated out of hospital without difficulty

## 2015-06-29 ENCOUNTER — Encounter (HOSPITAL_COMMUNITY)
Admission: RE | Admit: 2015-06-29 | Discharge: 2015-06-29 | Disposition: A | Discharge status: Home / Self Care | Payer: Medicare Other | Source: Ambulatory Visit | Attending: Internal Medicine | Admitting: Internal Medicine

## 2015-06-29 DIAGNOSIS — M272 Inflammatory conditions of jaws: Secondary | ICD-10-CM | POA: Diagnosis not present

## 2015-06-29 MED ORDER — SODIUM CHLORIDE 0.9 % IV SOLN
1.0000 g | Freq: Once | INTRAVENOUS | Status: AC
  Administered 2015-06-29: 1 g via INTRAVENOUS
  Filled 2015-06-29: qty 1

## 2015-06-29 MED ORDER — HEPARIN SOD (PORK) LOCK FLUSH 100 UNIT/ML IV SOLN
250.0000 [IU] | INTRAVENOUS | Status: AC | PRN
Start: 2015-06-29 — End: 2015-06-29
  Administered 2015-06-29: 250 [IU]

## 2015-06-30 ENCOUNTER — Encounter (HOSPITAL_COMMUNITY)
Admission: RE | Admit: 2015-06-30 | Discharge: 2015-06-30 | Disposition: A | Discharge status: Home / Self Care | Payer: Medicare Other | Source: Ambulatory Visit | Attending: Internal Medicine | Admitting: Internal Medicine

## 2015-06-30 DIAGNOSIS — M272 Inflammatory conditions of jaws: Secondary | ICD-10-CM | POA: Diagnosis not present

## 2015-06-30 MED ORDER — SODIUM CHLORIDE 0.9 % IV SOLN
1.0000 g | Freq: Once | INTRAVENOUS | Status: AC
  Administered 2015-06-30: 1 g via INTRAVENOUS
  Filled 2015-06-30: qty 1

## 2015-06-30 MED ORDER — HEPARIN SOD (PORK) LOCK FLUSH 100 UNIT/ML IV SOLN
250.0000 [IU] | INTRAVENOUS | Status: AC | PRN
  Administered 2015-06-30: 250 [IU]

## 2015-07-01 ENCOUNTER — Ambulatory Visit (HOSPITAL_COMMUNITY)
Admission: RE | Admit: 2015-07-01 | Discharge: 2015-07-01 | Disposition: A | Discharge status: Home / Self Care | Payer: Medicare Other | Source: Ambulatory Visit | Attending: Internal Medicine | Admitting: Internal Medicine

## 2015-07-01 DIAGNOSIS — M272 Inflammatory conditions of jaws: Secondary | ICD-10-CM | POA: Diagnosis not present

## 2015-07-01 MED ORDER — HEPARIN SOD (PORK) LOCK FLUSH 100 UNIT/ML IV SOLN
250.0000 [IU] | INTRAVENOUS | Status: AC | PRN
  Administered 2015-07-01: 250 [IU]
  Filled 2015-07-01: qty 5

## 2015-07-01 MED ORDER — SODIUM CHLORIDE 0.9 % IJ SOLN
10.0000 mL | INTRAMUSCULAR | Status: AC | PRN
  Administered 2015-07-01: 10 mL

## 2015-07-01 MED ORDER — ERTAPENEM SODIUM 1 G IJ SOLR
1.0000 g | Freq: Once | INTRAMUSCULAR | Status: AC
  Administered 2015-07-01: 1 g via INTRAVENOUS
  Filled 2015-07-01: qty 1

## 2015-07-01 NOTE — Progress Notes (Signed)
[name] [id number] DOB: [birth year] DOA: 06/22/2015   PCP: Pcp Not In System   Associated Diagnosis: Osteomyelitis of mandible  Procedure Note:IV infusion of Invanz without complications, PICC flushed with heparin per order  Pt tolerated infusion without complications, ambulated out of hospital without difficulty

## 2015-07-02 ENCOUNTER — Ambulatory Visit (HOSPITAL_COMMUNITY)
Admission: RE | Admit: 2015-07-02 | Discharge: 2015-07-02 | Disposition: A | Discharge status: Home / Self Care | Payer: Medicare Other | Source: Ambulatory Visit | Attending: Internal Medicine | Admitting: Internal Medicine

## 2015-07-02 DIAGNOSIS — M272 Inflammatory conditions of jaws: Secondary | ICD-10-CM | POA: Diagnosis not present

## 2015-07-02 MED ORDER — SODIUM CHLORIDE 0.9 % IJ SOLN
10.0000 mL | INTRAMUSCULAR | Status: AC | PRN
  Administered 2015-07-02: 10 mL

## 2015-07-02 MED ORDER — HEPARIN SOD (PORK) LOCK FLUSH 100 UNIT/ML IV SOLN
250.0000 [IU] | INTRAVENOUS | Status: AC | PRN
  Administered 2015-07-02: 250 [IU]
  Filled 2015-07-02: qty 5

## 2015-07-02 MED ORDER — SODIUM CHLORIDE 0.9 % IV SOLN
1.0000 g | Freq: Once | INTRAVENOUS | Status: AC
  Administered 2015-07-02: 1 g via INTRAVENOUS
  Filled 2015-07-02: qty 1

## 2015-07-02 MED ORDER — ERTAPENEM SODIUM 1 G IJ SOLR: 1.0000 g | INTRAMUSCULAR | Status: DC

## 2015-07-02 NOTE — Progress Notes (Signed)
[name] [id number] DOB: 1946/06/07 DOA: 07/02/2015   PCP: Pcp Not In System   Associated Diagnosis: Osteomyelitis of mandible  Procedure Note:IV infusion of Invanz without complications, PICC flushed with heparin per order  Pt tolerated infusion without complications, ambulated out of hospital without difficulty  Blair Hailey, RN

## 2015-07-03 ENCOUNTER — Ambulatory Visit (HOSPITAL_COMMUNITY)
Admission: RE | Admit: 2015-07-03 | Discharge: 2015-07-03 | Disposition: A | Discharge status: Home / Self Care | Payer: Medicare Other | Source: Ambulatory Visit | Attending: Internal Medicine | Admitting: Internal Medicine

## 2015-07-03 DIAGNOSIS — M272 Inflammatory conditions of jaws: Secondary | ICD-10-CM | POA: Diagnosis not present

## 2015-07-03 LAB — CBC
HCT: 30.4 % — ABNORMAL LOW (ref 36.0–46.0)
Hemoglobin: 9.8 g/dL — ABNORMAL LOW (ref 12.0–15.0)
MCH: 25.9 pg — ABNORMAL LOW (ref 26.0–34.0)
MCHC: 32.2 g/dL (ref 30.0–36.0)
MCV: 80.2 fL (ref 78.0–100.0)
Platelets: 197 10*3/uL (ref 150–400)
RBC: 3.79 MIL/uL — ABNORMAL LOW (ref 3.87–5.11)
RDW: 12.6 % (ref 11.5–15.5)
WBC: 7.4 10*3/uL (ref 4.0–10.5)

## 2015-07-03 LAB — BASIC METABOLIC PANEL
ANION GAP: 8 (ref 5–15)
BUN: 17 mg/dL (ref 6–20)
CO2: 25 mmol/L (ref 22–32)
Calcium: 8.6 mg/dL — ABNORMAL LOW (ref 8.9–10.3)
Chloride: 110 mmol/L (ref 101–111)
Creatinine, Ser: 0.63 mg/dL (ref 0.44–1.00)
GFR calc non Af Amer: >60 mL/min (ref >60)
GLUCOSE: 120 mg/dL — AB (ref 65–99)
POTASSIUM: 3.6 mmol/L (ref 3.5–5.1)
Sodium: 143 mmol/L (ref 135–145)

## 2015-07-03 LAB — SEDIMENTATION RATE: Sed Rate: 20 mm/hr (ref 0–22)

## 2015-07-03 LAB — C-REACTIVE PROTEIN: CRP: 0.8 mg/dL (ref <1.0)

## 2015-07-03 MED ORDER — SODIUM CHLORIDE 0.9 % IV SOLN
1.0000 g | Freq: Once | INTRAVENOUS | Status: AC
  Administered 2015-07-03: 1 g via INTRAVENOUS
  Filled 2015-07-03: qty 1

## 2015-07-03 MED ORDER — SODIUM CHLORIDE 0.9 % IJ SOLN
10.0000 mL | INTRAMUSCULAR | Status: AC | PRN
  Administered 2015-07-03: 10 mL

## 2015-07-03 MED ORDER — HEPARIN SOD (PORK) LOCK FLUSH 100 UNIT/ML IV SOLN
250.0000 [IU] | INTRAVENOUS | Status: AC | PRN
  Administered 2015-07-03: 250 [IU]
  Filled 2015-07-03: qty 5

## 2015-07-03 NOTE — Procedures (Signed)
[facility]  Procedure Note  [name] [id number] DOB: [birth year] DOA: 07/03/2015  Dr. Megan Salon  Associated Diagnosis: Osteomyelitis of mandible  Procedure Note:labs drawn per order, IV infusion of Invanz without complications, PICC flushed with heparin per order, Dressing changed per order  Pt tolerated infusion without complications, ambulated out of day hospitall without difficulty   TATUM, Jess Sulak, RN  [facility]

## 2015-07-04 ENCOUNTER — Ambulatory Visit (HOSPITAL_COMMUNITY)
Admission: RE | Admit: 2015-07-04 | Discharge: 2015-07-04 | Disposition: A | Discharge status: Home / Self Care | Payer: Medicare Other | Source: Ambulatory Visit | Attending: Internal Medicine | Admitting: Internal Medicine

## 2015-07-04 DIAGNOSIS — M272 Inflammatory conditions of jaws: Secondary | ICD-10-CM | POA: Diagnosis not present

## 2015-07-04 MED ORDER — HEPARIN SOD (PORK) LOCK FLUSH 100 UNIT/ML IV SOLN
250.0000 [IU] | INTRAVENOUS | Status: AC | PRN
  Administered 2015-07-04: 250 [IU]
  Filled 2015-07-04: qty 5

## 2015-07-04 MED ORDER — SODIUM CHLORIDE 0.9 % IJ SOLN
10.0000 mL | INTRAMUSCULAR | Status: AC | PRN
  Administered 2015-07-04: 10 mL

## 2015-07-04 MED ORDER — SODIUM CHLORIDE 0.9 % IV SOLN
1.0000 g | Freq: Once | INTRAVENOUS | Status: AC
  Administered 2015-07-04: 1 g via INTRAVENOUS
  Filled 2015-07-04: qty 1

## 2015-07-04 NOTE — Progress Notes (Signed)
[name] [id number] DOB: September 14, 1945 DOA: 07/04/2015  Dr. Megan Salon  Associated Diagnosis: Osteomyelitis of mandible  Procedure Note: IV infusion of Invanz without complications, PICC flushed with heparin per order  Pt tolerated infusion without complications, ambulated out of day hospitall without difficulty

## 2015-07-05 ENCOUNTER — Encounter (HOSPITAL_COMMUNITY): Payer: Self-pay

## 2015-07-05 ENCOUNTER — Encounter (HOSPITAL_COMMUNITY)
Admission: RE | Admit: 2015-07-05 | Discharge: 2015-07-05 | Disposition: A | Discharge status: Home / Self Care | Payer: Medicare Other | Source: Ambulatory Visit | Attending: Internal Medicine | Admitting: Internal Medicine

## 2015-07-05 DIAGNOSIS — M272 Inflammatory conditions of jaws: Secondary | ICD-10-CM | POA: Diagnosis not present

## 2015-07-05 HISTORY — DX: Adverse effect of unspecified anesthetic, initial encounter: T41.45XA

## 2015-07-05 HISTORY — DX: Osteomyelitis, unspecified: M86.9

## 2015-07-05 HISTORY — DX: Hypothyroidism, unspecified: E03.9

## 2015-07-05 HISTORY — DX: Depression, unspecified: F32.A

## 2015-07-05 HISTORY — DX: Unspecified osteoarthritis, unspecified site: M19.90

## 2015-07-05 HISTORY — DX: Nausea with vomiting, unspecified: R11.2

## 2015-07-05 HISTORY — DX: Other complications of anesthesia, initial encounter: T88.59XA

## 2015-07-05 HISTORY — DX: Major depressive disorder, single episode, unspecified: F32.9

## 2015-07-05 HISTORY — DX: Other specified postprocedural states: Z98.890

## 2015-07-05 MED ORDER — SODIUM CHLORIDE 0.9 % IJ SOLN
10.0000 mL | INTRAMUSCULAR | Status: DC | PRN
  Administered 2015-07-05 (×2): 10 mL via INTRAVENOUS
  Filled 2015-07-05 (×2): qty 10

## 2015-07-05 MED ORDER — SODIUM CHLORIDE 0.9 % IV SOLN
1.0000 g | INTRAVENOUS | Status: DC
  Administered 2015-07-05: 1 g via INTRAVENOUS
  Filled 2015-07-05 (×2): qty 1

## 2015-07-05 MED ORDER — SODIUM CHLORIDE 0.9 % IV SOLN
Freq: Every day | INTRAVENOUS | Status: DC
  Administered 2015-07-05: 10:00:00 via INTRAVENOUS

## 2015-07-05 MED ORDER — HEPARIN SOD (PORK) LOCK FLUSH 100 UNIT/ML IV SOLN
250.0000 [IU] | INTRAVENOUS | Status: DC | PRN
  Administered 2015-07-05: 11:00:00
  Filled 2015-07-05: qty 5

## 2015-07-06 ENCOUNTER — Encounter (HOSPITAL_COMMUNITY)
Admission: RE | Admit: 2015-07-06 | Discharge: 2015-07-06 | Disposition: A | Discharge status: Home / Self Care | Payer: Medicare Other | Source: Ambulatory Visit | Attending: Internal Medicine | Admitting: Internal Medicine

## 2015-07-06 ENCOUNTER — Encounter (HOSPITAL_COMMUNITY): Payer: Medicare Other

## 2015-07-06 DIAGNOSIS — M272 Inflammatory conditions of jaws: Secondary | ICD-10-CM | POA: Diagnosis not present

## 2015-07-06 MED ORDER — SODIUM CHLORIDE 0.9 % IV SOLN
1.0000 g | Freq: Once | INTRAVENOUS | Status: AC
  Administered 2015-07-06: 1 g via INTRAVENOUS
  Filled 2015-07-06: qty 1

## 2015-07-06 MED ORDER — HEPARIN SOD (PORK) LOCK FLUSH 100 UNIT/ML IV SOLN
500.0000 [IU] | INTRAVENOUS | Status: DC | PRN
  Administered 2015-07-06: 250 [IU]

## 2015-07-06 MED ORDER — SODIUM CHLORIDE 0.9 % IJ SOLN
10.0000 mL | Freq: Two times a day (BID) | INTRAMUSCULAR | Status: DC
  Administered 2015-07-06: 10 mL

## 2015-07-06 MED ORDER — SODIUM CHLORIDE 0.9 % IJ SOLN
10.0000 mL | INTRAMUSCULAR | Status: DC | PRN
Start: 2015-07-06 — End: 2015-07-07
  Administered 2015-07-06: 10 mL

## 2015-07-06 MED ORDER — HEPARIN SOD (PORK) LOCK FLUSH 100 UNIT/ML IV SOLN
500.0000 [IU] | INTRAVENOUS | Status: DC
  Administered 2015-07-06: 250 [IU]

## 2015-07-06 NOTE — Progress Notes (Signed)
Pt arrived to room 1506 this morning at 1000, to receive IV antibiotic per right PICC. Antibiotic given. IV nurse came to clamp the PICC after flush and Heparin. Pt left floor at 1150. VS WNL

## 2015-07-07 ENCOUNTER — Encounter (HOSPITAL_COMMUNITY)
Admission: RE | Admit: 2015-07-07 | Discharge: 2015-07-07 | Disposition: A | Discharge status: Home / Self Care | Payer: Medicare Other | Source: Ambulatory Visit | Attending: Internal Medicine | Admitting: Internal Medicine

## 2015-07-07 DIAGNOSIS — M272 Inflammatory conditions of jaws: Secondary | ICD-10-CM | POA: Diagnosis not present

## 2015-07-07 MED ORDER — SODIUM CHLORIDE 0.9 % IV SOLN
1.0000 g | Freq: Once | INTRAVENOUS | Status: AC
  Administered 2015-07-07: 1 g via INTRAVENOUS
  Filled 2015-07-07: qty 1

## 2015-07-08 ENCOUNTER — Encounter (HOSPITAL_COMMUNITY)
Admission: RE | Admit: 2015-07-08 | Discharge: 2015-07-08 | Disposition: A | Discharge status: Home / Self Care | Payer: Medicare Other | Source: Ambulatory Visit | Attending: Internal Medicine | Admitting: Internal Medicine

## 2015-07-08 DIAGNOSIS — M272 Inflammatory conditions of jaws: Secondary | ICD-10-CM | POA: Diagnosis not present

## 2015-07-08 MED ORDER — ERTAPENEM SODIUM 1 G IJ SOLR
1.0000 g | Freq: Once | INTRAMUSCULAR | Status: AC
  Administered 2015-07-08: 1 g via INTRAVENOUS
  Filled 2015-07-08: qty 1

## 2015-07-08 MED ORDER — HEPARIN SOD (PORK) LOCK FLUSH 100 UNIT/ML IV SOLN
250.0000 [IU] | INTRAVENOUS | Status: AC | PRN
  Administered 2015-07-08: 250 [IU]

## 2015-07-08 NOTE — Progress Notes (Signed)
Pt came to unit around 10am, IV antibiotics given and IV team flushed picc arm. Pt vitals stable before and after IV antibiotics. Pt left unit at 11:50.

## 2015-07-09 ENCOUNTER — Ambulatory Visit (HOSPITAL_COMMUNITY)
Admission: RE | Admit: 2015-07-09 | Discharge: 2015-07-09 | Disposition: A | Discharge status: Home / Self Care | Payer: Medicare Other | Source: Ambulatory Visit | Attending: Internal Medicine | Admitting: Internal Medicine

## 2015-07-09 DIAGNOSIS — M272 Inflammatory conditions of jaws: Secondary | ICD-10-CM | POA: Diagnosis not present

## 2015-07-09 MED ORDER — SODIUM CHLORIDE 0.9 % IJ SOLN
10.0000 mL | INTRAMUSCULAR | Status: AC | PRN
  Administered 2015-07-09: 10 mL

## 2015-07-09 MED ORDER — SODIUM CHLORIDE 0.9 % IV SOLN
1.0000 g | Freq: Once | INTRAVENOUS | Status: AC
  Administered 2015-07-09: 1 g via INTRAVENOUS
  Filled 2015-07-09: qty 1

## 2015-07-09 MED ORDER — HEPARIN SOD (PORK) LOCK FLUSH 100 UNIT/ML IV SOLN
250.0000 [IU] | INTRAVENOUS | Status: AC | PRN
  Administered 2015-07-09: 250 [IU]
  Filled 2015-07-09: qty 5

## 2015-07-09 NOTE — Progress Notes (Signed)
[name] [id number] DOB: 07-Mar-1946 DOA: 07/09/2015  Dr. Megan Salon  Associated Diagnosis: Osteomyelitis of mandible  Procedure Note: IV infusion of Invanz without complications, PICC flushed with heparin per order  Pt tolerated infusion without complications, ambulated out of day hospitall without difficulty

## 2015-07-10 ENCOUNTER — Ambulatory Visit (HOSPITAL_COMMUNITY)
Admission: RE | Admit: 2015-07-10 | Discharge: 2015-07-10 | Disposition: A | Discharge status: Home / Self Care | Payer: Medicare Other | Source: Ambulatory Visit | Attending: Internal Medicine | Admitting: Internal Medicine

## 2015-07-10 DIAGNOSIS — M272 Inflammatory conditions of jaws: Secondary | ICD-10-CM | POA: Diagnosis not present

## 2015-07-10 LAB — C-REACTIVE PROTEIN: CRP: 0.7 mg/dL (ref <1.0)

## 2015-07-10 LAB — CBC
HCT: 30.6 % — ABNORMAL LOW (ref 36.0–46.0)
HEMOGLOBIN: 9.7 g/dL — AB (ref 12.0–15.0)
MCH: 25.2 pg — AB (ref 26.0–34.0)
MCHC: 31.7 g/dL (ref 30.0–36.0)
MCV: 79.5 fL (ref 78.0–100.0)
PLATELETS: 206 10*3/uL (ref 150–400)
RBC: 3.85 MIL/uL — AB (ref 3.87–5.11)
RDW: 12.4 % (ref 11.5–15.5)
WBC: 7.1 10*3/uL (ref 4.0–10.5)

## 2015-07-10 LAB — BASIC METABOLIC PANEL
Anion gap: 7 (ref 5–15)
BUN: 25 mg/dL — AB (ref 6–20)
CHLORIDE: 106 mmol/L (ref 101–111)
CO2: 26 mmol/L (ref 22–32)
CREATININE: 0.59 mg/dL (ref 0.44–1.00)
Calcium: 9 mg/dL (ref 8.9–10.3)
Glucose, Bld: 122 mg/dL — ABNORMAL HIGH (ref 65–99)
Potassium: 4 mmol/L (ref 3.5–5.1)
SODIUM: 139 mmol/L (ref 135–145)

## 2015-07-10 LAB — SEDIMENTATION RATE: SED RATE: 34 mm/h — AB (ref 0–22)

## 2015-07-10 MED ORDER — SODIUM CHLORIDE 0.9 % IJ SOLN
10.0000 mL | INTRAMUSCULAR | Status: AC | PRN
  Administered 2015-07-10: 10 mL

## 2015-07-10 MED ORDER — HEPARIN SOD (PORK) LOCK FLUSH 100 UNIT/ML IV SOLN
250.0000 [IU] | INTRAVENOUS | Status: AC | PRN
  Administered 2015-07-10: 250 [IU]
  Filled 2015-07-10: qty 5

## 2015-07-10 MED ORDER — SODIUM CHLORIDE 0.9 % IV SOLN
1.0000 g | Freq: Once | INTRAVENOUS | Status: AC
  Administered 2015-07-10: 1 g via INTRAVENOUS
  Filled 2015-07-10: qty 1

## 2015-07-10 NOTE — Progress Notes (Signed)
[name] [id number] DOB: [birth year] DOA: 07/10/2015  Dr. Megan Salon  Associated Diagnosis: Osteomyelitis of mandible  Procedure Note:labs drawn per order, IV infusion of Invanz without complications, PICC flushed with heparin per order, Dressing changed per order  Pt tolerated infusion without complications, ambulated out of day hospitall without difficulty

## 2015-07-11 ENCOUNTER — Ambulatory Visit (HOSPITAL_COMMUNITY)
Admission: RE | Admit: 2015-07-11 | Discharge: 2015-07-11 | Disposition: A | Discharge status: Home / Self Care | Payer: Medicare Other | Source: Ambulatory Visit | Attending: Internal Medicine | Admitting: Internal Medicine

## 2015-07-11 DIAGNOSIS — M272 Inflammatory conditions of jaws: Secondary | ICD-10-CM | POA: Diagnosis not present

## 2015-07-11 MED ORDER — HEPARIN SOD (PORK) LOCK FLUSH 100 UNIT/ML IV SOLN
250.0000 [IU] | INTRAVENOUS | Status: AC | PRN
  Administered 2015-07-11: 250 [IU]
  Filled 2015-07-11: qty 5

## 2015-07-11 MED ORDER — SODIUM CHLORIDE 0.9 % IJ SOLN
10.0000 mL | INTRAMUSCULAR | Status: AC | PRN
  Administered 2015-07-11: 10 mL

## 2015-07-11 MED ORDER — SODIUM CHLORIDE 0.9 % IV SOLN
1.0000 g | Freq: Once | INTRAVENOUS | Status: AC
  Administered 2015-07-11: 1 g via INTRAVENOUS
  Filled 2015-07-11: qty 1

## 2015-07-11 NOTE — Progress Notes (Signed)
Ordering Physician: Dr. Michel Bickers  Associated Diagnosis: Osteomyelitis of mandible  Procedure Note:IV infusion of Invanz without complications, PICC flushed with heparin per order Pt tolerated infusion without complications, ambulated out of day hospitall without difficulty

## 2015-07-12 ENCOUNTER — Ambulatory Visit (HOSPITAL_COMMUNITY)
Admission: RE | Admit: 2015-07-12 | Discharge: 2015-07-12 | Disposition: A | Discharge status: Home / Self Care | Payer: Medicare Other | Source: Ambulatory Visit | Attending: Internal Medicine | Admitting: Internal Medicine

## 2015-07-12 DIAGNOSIS — M272 Inflammatory conditions of jaws: Secondary | ICD-10-CM | POA: Diagnosis not present

## 2015-07-12 MED ORDER — SODIUM CHLORIDE 0.9 % IV SOLN
1.0000 g | Freq: Once | INTRAVENOUS | Status: AC
  Administered 2015-07-12: 1 g via INTRAVENOUS
  Filled 2015-07-12: qty 1

## 2015-07-12 MED ORDER — SODIUM CHLORIDE 0.9 % IJ SOLN
10.0000 mL | INTRAMUSCULAR | Status: AC | PRN
  Administered 2015-07-12: 10 mL

## 2015-07-12 MED ORDER — HEPARIN SOD (PORK) LOCK FLUSH 100 UNIT/ML IV SOLN
250.0000 [IU] | INTRAVENOUS | Status: AC | PRN
  Administered 2015-07-12: 250 [IU]
  Filled 2015-07-12: qty 5

## 2015-07-12 NOTE — Progress Notes (Signed)
[name] [id number] DOB: 12-26-1945 DOA: 07/12/2015  Dr. Megan Salon  Associated Diagnosis: Osteomyelitis of mandible  Procedure Note: IV infusion of Invanz without complications, PICC flushed with heparin per order  Pt tolerated infusion without complications, ambulated out of day hospitall without difficulty

## 2015-07-13 ENCOUNTER — Encounter (HOSPITAL_COMMUNITY)
Admission: RE | Admit: 2015-07-13 | Discharge: 2015-07-13 | Disposition: A | Discharge status: Home / Self Care | Payer: Medicare Other | Source: Ambulatory Visit | Attending: Internal Medicine | Admitting: Internal Medicine

## 2015-07-13 DIAGNOSIS — M272 Inflammatory conditions of jaws: Secondary | ICD-10-CM | POA: Diagnosis not present

## 2015-07-13 MED ORDER — HEPARIN SOD (PORK) LOCK FLUSH 100 UNIT/ML IV SOLN
250.0000 [IU] | INTRAVENOUS | Status: AC | PRN
  Administered 2015-07-13: 250 [IU]

## 2015-07-13 MED ORDER — SODIUM CHLORIDE 0.9 % IJ SOLN
10.0000 mL | INTRAMUSCULAR | Status: DC | PRN
  Administered 2015-07-13: 10 mL
  Filled 2015-07-13: qty 40

## 2015-07-13 MED ORDER — SODIUM CHLORIDE 0.9 % IV SOLN
1.0000 g | Freq: Once | INTRAVENOUS | Status: AC
  Administered 2015-07-13: 1 g via INTRAVENOUS
  Filled 2015-07-13: qty 1

## 2015-07-13 NOTE — Progress Notes (Signed)
IV Invanz infused, vital signs stable.  IV team paged to disconnect patient from fluids.

## 2015-07-14 ENCOUNTER — Encounter (HOSPITAL_COMMUNITY)
Admission: RE | Admit: 2015-07-14 | Discharge: 2015-07-14 | Disposition: A | Discharge status: Home / Self Care | Payer: Medicare Other | Source: Ambulatory Visit | Attending: Internal Medicine | Admitting: Internal Medicine

## 2015-07-14 DIAGNOSIS — M272 Inflammatory conditions of jaws: Secondary | ICD-10-CM | POA: Insufficient documentation

## 2015-07-14 MED ORDER — SODIUM CHLORIDE 0.9 % IV SOLN
1.0000 g | Freq: Once | INTRAVENOUS | Status: AC
  Administered 2015-07-14: 1 g via INTRAVENOUS
  Filled 2015-07-14: qty 1

## 2015-07-14 MED ORDER — HEPARIN SOD (PORK) LOCK FLUSH 100 UNIT/ML IV SOLN: 250.0000 [IU] | INTRAVENOUS | Status: DC | PRN

## 2015-07-14 MED ORDER — SODIUM CHLORIDE 0.9 % IJ SOLN: 10.0000 mL | INTRAMUSCULAR | Status: DC | PRN

## 2015-07-15 ENCOUNTER — Encounter (HOSPITAL_COMMUNITY)
Admission: RE | Admit: 2015-07-15 | Discharge: 2015-07-15 | Disposition: A | Discharge status: Home / Self Care | Payer: Medicare Other | Source: Ambulatory Visit | Attending: Internal Medicine | Admitting: Internal Medicine

## 2015-07-15 DIAGNOSIS — M272 Inflammatory conditions of jaws: Secondary | ICD-10-CM | POA: Diagnosis not present

## 2015-07-15 MED ORDER — SODIUM CHLORIDE 0.9 % IJ SOLN
10.0000 mL | INTRAMUSCULAR | Status: DC | PRN
  Administered 2015-07-15: 10 mL

## 2015-07-15 MED ORDER — SODIUM CHLORIDE 0.9 % IV SOLN
1.0000 g | Freq: Once | INTRAVENOUS | Status: AC
  Administered 2015-07-15: 1 g via INTRAVENOUS
  Filled 2015-07-15: qty 1

## 2015-07-15 MED ORDER — HEPARIN SOD (PORK) LOCK FLUSH 100 UNIT/ML IV SOLN
250.0000 [IU] | INTRAVENOUS | Status: DC | PRN
  Administered 2015-07-15: 250 [IU]

## 2015-07-15 MED ORDER — HEPARIN SOD (PORK) LOCK FLUSH 100 UNIT/ML IV SOLN: 250.0000 [IU] | Freq: Every day | INTRAVENOUS | Status: DC

## 2015-07-15 MED ORDER — SODIUM CHLORIDE 0.9 % IJ SOLN: 10.0000 mL | Freq: Two times a day (BID) | INTRAMUSCULAR | Status: DC

## 2015-07-15 NOTE — Progress Notes (Signed)
IV Invanz infused. IV team paged to saline lock PICC line.  Patient still here at 1205, and wants to [facility] PICC line herself. RN asked patient to please wait a little while longer for IV team to come unhook patient.

## 2015-07-16 ENCOUNTER — Ambulatory Visit (HOSPITAL_COMMUNITY)
Admission: RE | Admit: 2015-07-16 | Discharge: 2015-07-16 | Disposition: A | Discharge status: Home / Self Care | Payer: Medicare Other | Source: Ambulatory Visit | Attending: Internal Medicine | Admitting: Internal Medicine

## 2015-07-16 DIAGNOSIS — M272 Inflammatory conditions of jaws: Secondary | ICD-10-CM | POA: Diagnosis not present

## 2015-07-16 MED ORDER — ERTAPENEM SODIUM 1 G IJ SOLR
1.0000 g | INTRAMUSCULAR | Status: DC
  Administered 2015-07-16: 1 g via INTRAVENOUS
  Filled 2015-07-16: qty 1

## 2015-07-16 MED ORDER — HEPARIN SOD (PORK) LOCK FLUSH 100 UNIT/ML IV SOLN
250.0000 [IU] | INTRAVENOUS | Status: AC | PRN
  Administered 2015-07-16: 250 [IU]

## 2015-07-16 MED ORDER — SODIUM CHLORIDE 0.9 % IJ SOLN
10.0000 mL | INTRAMUSCULAR | Status: AC | PRN
  Administered 2015-07-16: 10 mL

## 2015-07-16 NOTE — Progress Notes (Signed)
Dr. Megan Salon  Associated Diagnosis: Osteomyelitis of mandible  Procedure Note: IV infusion of Invanz without complications, PICC flushed with heparin per order.  Pt tolerated infusion without complications, ambulated out of day hospital without difficulty.

## 2015-07-17 ENCOUNTER — Ambulatory Visit (HOSPITAL_COMMUNITY)
Admission: RE | Admit: 2015-07-17 | Discharge: 2015-07-17 | Disposition: A | Discharge status: Home / Self Care | Payer: Medicare Other | Source: Ambulatory Visit | Attending: Internal Medicine | Admitting: Internal Medicine

## 2015-07-17 DIAGNOSIS — M272 Inflammatory conditions of jaws: Secondary | ICD-10-CM | POA: Diagnosis not present

## 2015-07-17 LAB — BASIC METABOLIC PANEL
Anion gap: 8 (ref 5–15)
BUN: 22 mg/dL — AB (ref 6–20)
CHLORIDE: 107 mmol/L (ref 101–111)
CO2: 25 mmol/L (ref 22–32)
CREATININE: 0.53 mg/dL (ref 0.44–1.00)
Calcium: 8.8 mg/dL — ABNORMAL LOW (ref 8.9–10.3)
GFR calc non Af Amer: >60 mL/min (ref >60)
GLUCOSE: 100 mg/dL — AB (ref 65–99)
POTASSIUM: 4.1 mmol/L (ref 3.5–5.1)
SODIUM: 140 mmol/L (ref 135–145)

## 2015-07-17 LAB — CBC
HCT: 30.7 % — ABNORMAL LOW (ref 36.0–46.0)
HEMOGLOBIN: 9.6 g/dL — AB (ref 12.0–15.0)
MCH: 25.3 pg — AB (ref 26.0–34.0)
MCHC: 31.3 g/dL (ref 30.0–36.0)
MCV: 80.8 fL (ref 78.0–100.0)
PLATELETS: 236 10*3/uL (ref 150–400)
RBC: 3.8 MIL/uL — ABNORMAL LOW (ref 3.87–5.11)
RDW: 12.9 % (ref 11.5–15.5)
WBC: 7.4 10*3/uL (ref 4.0–10.5)

## 2015-07-17 LAB — C-REACTIVE PROTEIN: CRP: 1 mg/dL — ABNORMAL HIGH (ref <1.0)

## 2015-07-17 LAB — SEDIMENTATION RATE: Sed Rate: 36 mm/hr — ABNORMAL HIGH (ref 0–22)

## 2015-07-17 MED ORDER — HEPARIN SOD (PORK) LOCK FLUSH 100 UNIT/ML IV SOLN
250.0000 [IU] | INTRAVENOUS | Status: AC | PRN
  Administered 2015-07-17: 250 [IU]
  Filled 2015-07-17: qty 5

## 2015-07-17 MED ORDER — SODIUM CHLORIDE 0.9 % IV SOLN
1.0000 g | Freq: Once | INTRAVENOUS | Status: AC
  Administered 2015-07-17: 1 g via INTRAVENOUS
  Filled 2015-07-17: qty 1

## 2015-07-17 MED ORDER — SODIUM CHLORIDE 0.9 % IJ SOLN
10.0000 mL | INTRAMUSCULAR | Status: AC | PRN
  Administered 2015-07-17: 10 mL

## 2015-07-17 NOTE — Progress Notes (Signed)
Dr. Megan Salon  Associated Diagnosis: Osteomyelitis of mandible  Procedure Note: blood drawn, PICC drsg and cap changed; IV infusion of Invanz without complications; PICC flushed with heparin per order.  Pt tolerated infusion without complications, ambulated out of day hospital without difficulty.       Blair Hailey, RN

## 2015-07-18 ENCOUNTER — Ambulatory Visit (HOSPITAL_COMMUNITY)
Admission: RE | Admit: 2015-07-18 | Discharge: 2015-07-18 | Disposition: A | Discharge status: Home / Self Care | Payer: Medicare Other | Source: Ambulatory Visit | Attending: Internal Medicine | Admitting: Internal Medicine

## 2015-07-18 DIAGNOSIS — M272 Inflammatory conditions of jaws: Secondary | ICD-10-CM

## 2015-07-18 MED ORDER — SODIUM CHLORIDE 0.9 % IJ SOLN
10.0000 mL | INTRAMUSCULAR | Status: AC | PRN
  Administered 2015-07-18: 10 mL

## 2015-07-18 MED ORDER — HEPARIN SOD (PORK) LOCK FLUSH 100 UNIT/ML IV SOLN
250.0000 [IU] | INTRAVENOUS | Status: AC | PRN
  Administered 2015-07-18: 250 [IU]

## 2015-07-18 MED ORDER — SODIUM CHLORIDE 0.9 % IV SOLN
1.0000 g | Freq: Once | INTRAVENOUS | Status: AC
  Administered 2015-07-18: 1 g via INTRAVENOUS
  Filled 2015-07-18: qty 1

## 2015-07-18 NOTE — Progress Notes (Signed)
Dr. Megan Salon  Associated Diagnosis: Osteomyelitis of mandible  Procedure Note: IV infusion of Invanz without complications, PICC flushed with heparin per order.  Pt tolerated infusion without complications, ambulated out of day hospital without difficulty.

## 2015-07-19 ENCOUNTER — Ambulatory Visit (HOSPITAL_COMMUNITY)
Admission: RE | Admit: 2015-07-19 | Discharge: 2015-07-19 | Disposition: A | Discharge status: Home / Self Care | Payer: Medicare Other | Source: Ambulatory Visit | Attending: Internal Medicine | Admitting: Internal Medicine

## 2015-07-19 DIAGNOSIS — M810 Age-related osteoporosis without current pathological fracture: Secondary | ICD-10-CM | POA: Diagnosis not present

## 2015-07-19 DIAGNOSIS — M272 Inflammatory conditions of jaws: Secondary | ICD-10-CM | POA: Diagnosis not present

## 2015-07-19 DIAGNOSIS — E039 Hypothyroidism, unspecified: Secondary | ICD-10-CM | POA: Diagnosis not present

## 2015-07-19 DIAGNOSIS — N182 Chronic kidney disease, stage 2 (mild): Secondary | ICD-10-CM | POA: Diagnosis not present

## 2015-07-19 DIAGNOSIS — F339 Major depressive disorder, recurrent, unspecified: Secondary | ICD-10-CM | POA: Diagnosis not present

## 2015-07-19 MED ORDER — HEPARIN SOD (PORK) LOCK FLUSH 100 UNIT/ML IV SOLN
250.0000 [IU] | INTRAVENOUS | Status: AC | PRN
  Administered 2015-07-19: 250 [IU]
  Filled 2015-07-19: qty 5

## 2015-07-19 MED ORDER — SODIUM CHLORIDE 0.9 % IJ SOLN
10.0000 mL | INTRAMUSCULAR | Status: AC | PRN
  Administered 2015-07-19: 10 mL

## 2015-07-19 MED ORDER — SODIUM CHLORIDE 0.9 % IV SOLN
1.0000 g | Freq: Once | INTRAVENOUS | Status: AC
  Administered 2015-07-19: 1 g via INTRAVENOUS
  Filled 2015-07-19: qty 1

## 2015-07-19 NOTE — Progress Notes (Signed)
Dr. Megan Salon  Associated Diagnosis: Osteomyelitis of mandible  Procedure Note: IV infusion of Invanz without complications, PICC flushed with heparin per order.  Pt tolerated infusion without complications, ambulated out of day hospital without difficulty.           Blair Hailey, RN

## 2015-07-20 ENCOUNTER — Encounter (HOSPITAL_COMMUNITY): Admission: RE | Admit: 2015-07-20 | Payer: Medicare Other | Source: Ambulatory Visit

## 2015-07-21 ENCOUNTER — Encounter (HOSPITAL_COMMUNITY)
Admission: RE | Admit: 2015-07-21 | Discharge: 2015-07-21 | Disposition: A | Discharge status: Home / Self Care | Payer: Medicare Other | Source: Ambulatory Visit | Attending: Internal Medicine | Admitting: Internal Medicine

## 2015-07-21 DIAGNOSIS — M272 Inflammatory conditions of jaws: Secondary | ICD-10-CM | POA: Diagnosis not present

## 2015-07-21 MED ORDER — SODIUM CHLORIDE 0.9 % IV SOLN
1.0000 g | Freq: Once | INTRAVENOUS | Status: AC
  Administered 2015-07-21: 1 g via INTRAVENOUS
  Filled 2015-07-21: qty 1

## 2015-07-22 ENCOUNTER — Encounter (HOSPITAL_COMMUNITY): Payer: Medicare Other

## 2015-07-23 ENCOUNTER — Ambulatory Visit (HOSPITAL_COMMUNITY)
Admission: RE | Admit: 2015-07-23 | Discharge: 2015-07-23 | Disposition: A | Discharge status: Home / Self Care | Payer: Medicare Other | Source: Ambulatory Visit | Attending: Internal Medicine | Admitting: Internal Medicine

## 2015-07-23 ENCOUNTER — Telehealth: Payer: Self-pay | Admitting: *Deleted

## 2015-07-23 DIAGNOSIS — M272 Inflammatory conditions of jaws: Secondary | ICD-10-CM | POA: Diagnosis not present

## 2015-07-23 MED ORDER — SODIUM CHLORIDE 0.9 % IV SOLN
1.0000 g | Freq: Once | INTRAVENOUS | Status: AC
  Administered 2015-07-23: 1 g via INTRAVENOUS
  Filled 2015-07-23: qty 1

## 2015-07-23 MED ORDER — HEPARIN SOD (PORK) LOCK FLUSH 100 UNIT/ML IV SOLN
250.0000 [IU] | INTRAVENOUS | Status: AC | PRN
  Administered 2015-07-23: 250 [IU]
  Filled 2015-07-23: qty 5

## 2015-07-23 MED ORDER — SODIUM CHLORIDE 0.9 % IJ SOLN: 10.0000 mL | INTRAMUSCULAR | Status: DC | PRN

## 2015-07-23 NOTE — Telephone Encounter (Signed)
Patient called and advised she has had a flare of her rheumatoid arthritis and her doctor wants her to take prednisone 5 mg bid and she wants to know if that will be ok with the invanz she is taking for her osteo of the Jaw. Advised her will ask the doctor and give her a call back.

## 2015-07-23 NOTE — Telephone Encounter (Signed)
I'm okay with her starting on prednisone.

## 2015-07-23 NOTE — Telephone Encounter (Signed)
Patient is aware and will start today. Advised her to watch and if anything is abnormal to call the office back asap.

## 2015-07-23 NOTE — Progress Notes (Signed)
Ordering MD: Michel Bickers Associated Diagnosis: osteomyelitis of mandible Procedure: IV infusion of Invanz; PICC line flush; PICC line dressing change per protocol  Pt tolerated well; no complications noted

## 2015-07-24 ENCOUNTER — Ambulatory Visit (HOSPITAL_COMMUNITY)
Admission: RE | Admit: 2015-07-24 | Discharge: 2015-07-24 | Disposition: A | Discharge status: Home / Self Care | Payer: Medicare Other | Source: Ambulatory Visit | Attending: Internal Medicine | Admitting: Internal Medicine

## 2015-07-24 DIAGNOSIS — M272 Inflammatory conditions of jaws: Secondary | ICD-10-CM | POA: Diagnosis not present

## 2015-07-24 LAB — BASIC METABOLIC PANEL
Anion gap: 8 (ref 5–15)
BUN: 28 mg/dL — AB (ref 6–20)
CHLORIDE: 106 mmol/L (ref 101–111)
CO2: 26 mmol/L (ref 22–32)
CREATININE: 0.67 mg/dL (ref 0.44–1.00)
Calcium: 9.3 mg/dL (ref 8.9–10.3)
GFR calc non Af Amer: >60 mL/min (ref >60)
Glucose, Bld: 125 mg/dL — ABNORMAL HIGH (ref 65–99)
Potassium: 4 mmol/L (ref 3.5–5.1)
Sodium: 140 mmol/L (ref 135–145)

## 2015-07-24 LAB — CBC
HEMATOCRIT: 30 % — AB (ref 36.0–46.0)
HEMOGLOBIN: 9.3 g/dL — AB (ref 12.0–15.0)
MCH: 24.9 pg — AB (ref 26.0–34.0)
MCHC: 31 g/dL (ref 30.0–36.0)
MCV: 80.4 fL (ref 78.0–100.0)
Platelets: 248 10*3/uL (ref 150–400)
RBC: 3.73 MIL/uL — ABNORMAL LOW (ref 3.87–5.11)
RDW: 13.2 % (ref 11.5–15.5)
WBC: 8.1 10*3/uL (ref 4.0–10.5)

## 2015-07-24 LAB — SEDIMENTATION RATE: Sed Rate: 46 mm/hr — ABNORMAL HIGH (ref 0–22)

## 2015-07-24 LAB — C-REACTIVE PROTEIN: CRP: 1.1 mg/dL — AB (ref <1.0)

## 2015-07-24 MED ORDER — HEPARIN SOD (PORK) LOCK FLUSH 100 UNIT/ML IV SOLN
250.0000 [IU] | INTRAVENOUS | Status: AC | PRN
  Administered 2015-07-24: 250 [IU]
  Filled 2015-07-24: qty 5

## 2015-07-24 MED ORDER — SODIUM CHLORIDE 0.9 % IJ SOLN
10.0000 mL | INTRAMUSCULAR | Status: AC | PRN
Start: 2015-07-24 — End: 2015-07-24
  Administered 2015-07-24: 10 mL

## 2015-07-24 MED ORDER — SODIUM CHLORIDE 0.9 % IV SOLN
1.0000 g | Freq: Once | INTRAVENOUS | Status: AC
  Administered 2015-07-24: 1 g via INTRAVENOUS
  Filled 2015-07-24: qty 1

## 2015-07-24 NOTE — Progress Notes (Signed)
Ordering MD: Michel Bickers Associated Diagnosis: osteomyelitis of mandible Procedure: IV infusion of Invanz; PICC line flush  Pt tolerated well; no complications noted

## 2015-07-25 ENCOUNTER — Ambulatory Visit (HOSPITAL_COMMUNITY)
Admission: RE | Admit: 2015-07-25 | Discharge: 2015-07-25 | Disposition: A | Discharge status: Home / Self Care | Payer: Medicare Other | Source: Ambulatory Visit | Attending: Internal Medicine | Admitting: Internal Medicine

## 2015-07-25 DIAGNOSIS — M272 Inflammatory conditions of jaws: Secondary | ICD-10-CM | POA: Diagnosis not present

## 2015-07-25 MED ORDER — SODIUM CHLORIDE 0.9 % IJ SOLN
10.0000 mL | INTRAMUSCULAR | Status: AC | PRN
Start: 2015-07-25 — End: 2015-07-25
  Administered 2015-07-25: 10 mL

## 2015-07-25 MED ORDER — SODIUM CHLORIDE 0.9 % IV SOLN
1.0000 g | Freq: Once | INTRAVENOUS | Status: AC
  Administered 2015-07-25: 1 g via INTRAVENOUS
  Filled 2015-07-25: qty 1

## 2015-07-25 MED ORDER — HEPARIN SOD (PORK) LOCK FLUSH 100 UNIT/ML IV SOLN
250.0000 [IU] | INTRAVENOUS | Status: AC | PRN
  Administered 2015-07-25: 250 [IU]
  Filled 2015-07-25: qty 5

## 2015-07-25 NOTE — Progress Notes (Signed)
Ordering MD: Michel Bickers  Associated Diagnosis: osteomyelitis of mandible  Procedure: IV infusion of Invanz; PICC line flush  Pt tolerated well; no complications noted

## 2015-07-26 ENCOUNTER — Ambulatory Visit (HOSPITAL_COMMUNITY)
Admission: RE | Admit: 2015-07-26 | Discharge: 2015-07-26 | Disposition: A | Discharge status: Home / Self Care | Payer: Medicare Other | Source: Ambulatory Visit | Attending: Internal Medicine | Admitting: Internal Medicine

## 2015-07-26 DIAGNOSIS — M272 Inflammatory conditions of jaws: Secondary | ICD-10-CM | POA: Diagnosis not present

## 2015-07-26 MED ORDER — SODIUM CHLORIDE 0.9 % IJ SOLN
10.0000 mL | INTRAMUSCULAR | Status: AC | PRN
  Administered 2015-07-26: 10 mL

## 2015-07-26 MED ORDER — HEPARIN SOD (PORK) LOCK FLUSH 100 UNIT/ML IV SOLN
250.0000 [IU] | INTRAVENOUS | Status: AC | PRN
  Administered 2015-07-26: 250 [IU]
  Filled 2015-07-26: qty 5

## 2015-07-26 MED ORDER — SODIUM CHLORIDE 0.9 % IV SOLN
1.0000 g | Freq: Every day | INTRAVENOUS | Status: DC
  Administered 2015-07-26: 1 g via INTRAVENOUS
  Filled 2015-07-26: qty 1

## 2015-07-26 NOTE — Progress Notes (Signed)
Ordering MD: Michel Bickers  Associated Diagnosis: osteomyelitis of mandible  Procedure: IV infusion of Invanz; PICC line flush with saline and 250 of heparin Pt tolerated well; no complications noted

## 2015-07-27 ENCOUNTER — Encounter (HOSPITAL_COMMUNITY)
Admission: RE | Admit: 2015-07-27 | Discharge: 2015-07-27 | Disposition: A | Discharge status: Home / Self Care | Payer: Medicare Other | Source: Ambulatory Visit | Attending: Internal Medicine | Admitting: Internal Medicine

## 2015-07-27 DIAGNOSIS — M272 Inflammatory conditions of jaws: Secondary | ICD-10-CM | POA: Diagnosis not present

## 2015-07-27 MED ORDER — HEPARIN SOD (PORK) LOCK FLUSH 100 UNIT/ML IV SOLN
250.0000 [IU] | INTRAVENOUS | Status: AC | PRN
  Administered 2015-07-27: 250 [IU]

## 2015-07-27 MED ORDER — HEPARIN SOD (PORK) LOCK FLUSH 100 UNIT/ML IV SOLN: 250.0000 [IU] | INTRAVENOUS | Status: DC | PRN

## 2015-07-27 MED ORDER — SODIUM CHLORIDE 0.9 % IV SOLN
1.0000 g | Freq: Every day | INTRAVENOUS | Status: DC
  Administered 2015-07-27: 1 g via INTRAVENOUS
  Filled 2015-07-27 (×2): qty 1

## 2015-07-27 MED ORDER — SODIUM CHLORIDE 0.9 % IJ SOLN
10.0000 mL | INTRAMUSCULAR | Status: AC | PRN
  Administered 2015-07-27: 10 mL

## 2015-07-27 NOTE — Progress Notes (Signed)
Pt to room 1612 for iv abx. [name], [employer]

## 2015-07-27 NOTE — Progress Notes (Signed)
Abx completed, IVT deaccessed & flushed PICC. Pt discharged. Carlette Palmatier, [employer]

## 2015-07-28 ENCOUNTER — Encounter (HOSPITAL_COMMUNITY)
Admission: RE | Admit: 2015-07-28 | Discharge: 2015-07-28 | Disposition: A | Discharge status: Home / Self Care | Payer: Medicare Other | Source: Ambulatory Visit | Attending: Internal Medicine | Admitting: Internal Medicine

## 2015-07-28 DIAGNOSIS — M272 Inflammatory conditions of jaws: Secondary | ICD-10-CM | POA: Diagnosis not present

## 2015-07-28 MED ORDER — SODIUM CHLORIDE 0.9 % IV SOLN
1.0000 g | Freq: Every day | INTRAVENOUS | Status: DC
  Administered 2015-07-28: 1 g via INTRAVENOUS
  Filled 2015-07-28 (×2): qty 1

## 2015-07-28 MED ORDER — HEPARIN SOD (PORK) LOCK FLUSH 100 UNIT/ML IV SOLN
250.0000 [IU] | INTRAVENOUS | Status: AC | PRN
  Administered 2015-07-28: 250 [IU]

## 2015-07-28 MED ORDER — SODIUM CHLORIDE 0.9 % IJ SOLN: 10.0000 mL | INTRAMUSCULAR | Status: DC | PRN

## 2015-07-28 MED ORDER — HEPARIN SOD (PORK) LOCK FLUSH 100 UNIT/ML IV SOLN: 250.0000 [IU] | INTRAVENOUS | Status: DC | PRN

## 2015-07-29 ENCOUNTER — Ambulatory Visit (HOSPITAL_COMMUNITY)
Admission: RE | Admit: 2015-07-29 | Discharge: 2015-07-29 | Disposition: A | Discharge status: Home / Self Care | Payer: Medicare Other | Source: Ambulatory Visit | Attending: Internal Medicine | Admitting: Internal Medicine

## 2015-07-29 DIAGNOSIS — M272 Inflammatory conditions of jaws: Secondary | ICD-10-CM | POA: Diagnosis not present

## 2015-07-29 MED ORDER — HEPARIN SOD (PORK) LOCK FLUSH 100 UNIT/ML IV SOLN
250.0000 [IU] | INTRAVENOUS | Status: AC | PRN
  Administered 2015-07-29: 250 [IU]
  Filled 2015-07-29: qty 5

## 2015-07-29 MED ORDER — SODIUM CHLORIDE 0.9 % IV SOLN
1.0000 g | Freq: Once | INTRAVENOUS | Status: AC
  Administered 2015-07-29: 1 g via INTRAVENOUS
  Filled 2015-07-29: qty 1

## 2015-07-29 MED ORDER — SODIUM CHLORIDE 0.9 % IJ SOLN
10.0000 mL | INTRAMUSCULAR | Status: AC | PRN
  Administered 2015-07-29: 10 mL

## 2015-07-29 NOTE — Progress Notes (Signed)
Ordering MD: Michel Bickers Associated Diagnosis: osteomyelitis of mandible Procedure: IV infusion of Invanz; PICC line flush  Pt tolerated well; no complications noted

## 2015-07-30 ENCOUNTER — Ambulatory Visit (INDEPENDENT_AMBULATORY_CARE_PROVIDER_SITE_OTHER): Payer: Medicare Other | Admitting: Internal Medicine

## 2015-07-30 ENCOUNTER — Encounter: Payer: Self-pay | Admitting: Internal Medicine

## 2015-07-30 ENCOUNTER — Ambulatory Visit (HOSPITAL_COMMUNITY)
Admission: RE | Admit: 2015-07-30 | Discharge: 2015-07-30 | Disposition: A | Discharge status: Home / Self Care | Payer: Medicare Other | Source: Ambulatory Visit | Attending: Internal Medicine | Admitting: Internal Medicine

## 2015-07-30 VITALS — BP 135/79 | HR 83 | Temp 97.9°F | Wt 118.0 lb

## 2015-07-30 DIAGNOSIS — M272 Inflammatory conditions of jaws: Secondary | ICD-10-CM | POA: Diagnosis not present

## 2015-07-30 MED ORDER — SODIUM CHLORIDE 0.9 % IJ SOLN
10.0000 mL | INTRAMUSCULAR | Status: AC | PRN
  Administered 2015-07-30: 10 mL

## 2015-07-30 MED ORDER — SODIUM CHLORIDE 0.9 % IV SOLN
1.0000 g | Freq: Once | INTRAVENOUS | Status: AC
  Administered 2015-07-30: 1 g via INTRAVENOUS
  Filled 2015-07-30: qty 1

## 2015-07-30 MED ORDER — HEPARIN SOD (PORK) LOCK FLUSH 100 UNIT/ML IV SOLN
250.0000 [IU] | INTRAVENOUS | Status: AC | PRN
  Administered 2015-07-30: 250 [IU]
  Filled 2015-07-30: qty 5

## 2015-07-30 NOTE — Progress Notes (Signed)
[facility] for Infectious Disease  Patient Active Problem List   Diagnosis Date Noted  . 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 Rheumatoid arthritis ([place]) 03/29/2015  . Anxiety 03/29/2015  . Hypothyroidism 03/29/2015  . Hyperglycemia 03/29/2015    Patient's Medications  New Prescriptions   No medications on file  Previous Medications   CITALOPRAM (CELEXA) 10 MG TABLET    Take 10 mg by mouth at bedtime.   ERTAPENEM 1 G IN SODIUM CHLORIDE 0.9 % 50 ML    Inject 1 g into the vein daily.   FOLIC ACID (FOLVITE) Q000111Q MCG TABLET    Take 800 mcg by mouth daily.   IBUPROFEN (ADVIL,MOTRIN) 200 MG TABLET    Take 200 mg by mouth every 6 (six) hours as needed.   LEVOTHYROXINE (SYNTHROID, LEVOTHROID) 100 MCG TABLET    Take 88 mcg by mouth daily before breakfast.    MELATONIN 10 MG CAPS    Take 10 mg by mouth.   NONFORMULARY OR COMPOUNDED ITEM    every 8 (eight) weeks. [facility] every other month   RESTASIS 0.05 % OPHTHALMIC EMULSION      Modified Medications   No medications on file  Discontinued Medications   No medications on file    Subjective: Ms. Rubens is in for her routine follow-up visit. She has completed 41 days of IV ertapenem for her right mandibular osteomyelitis complicating a dental extraction. She has had no problems tolerating her PICC or ertapenem. She continues to have numbness of her right chin that is no longer having any aching pain. She has not had any further intraoral or cutaneous drainage.  Review of Systems: Review of Systems  Constitutional: Negative for fever, chills and diaphoresis.  HENT:       As noted in history of present illness.  Gastrointestinal: Negative for nausea, vomiting, abdominal pain and diarrhea.  Skin: Negative for rash.    Past Medical History  Diagnosis Date  . Osteomyelitis (HCC)     Mandible, right lower  . Hypothyroidism   . Arthritis   . Complication of anesthesia   . PONV  (postoperative nausea and vomiting)   . Depression     Social History  Substance Use Topics  . Smoking status: Former Research scientist (life sciences)  . Smokeless tobacco: Never Used  . Alcohol Use: No    No family history on file.  Allergies  Allergen Reactions  . Remicade [Infliximab] Shortness Of Breath    Objective: Filed Vitals:   07/30/15 1507  BP: 135/79  Pulse: 83  Temp: 97.9 F (36.6 C)  TempSrc: Oral  Weight: 118 lb (53.524 kg)   Body mass index is 21.58 kg/(m^2).  Physical Exam  Constitutional:  She is in good spirits.  HENT:  Mouth/Throat: No oropharyngeal exudate.  She has no intraoral lesions or drainage. She has a dimpled scar under the right side of her chin where her previous sinus tract was. There is no expressible drainage or fluctuance. There is no erythema. She has no pain with palpation.    Lab Results SED RATE (mm/hr)  Date Value  07/24/2015 46*  07/17/2015 36*  07/10/2015 34*   CRP (mg/dL)  Date Value  07/24/2015 1.1*  07/17/2015 1.0*  07/10/2015 0.7     Problem List Items Addressed This Visit      High   Osteomyelitis of mandible - Primary    She has had a good response to  ertapenem for treatment of her smoldering mandibular osteomyelitis. She will receive her last dose tomorrow then have her PICC pulled. She will follow-up with me in one month.      Relevant Medications   ertapenem 1 g in sodium chloride 0.9 % 50 mL       Michel Bickers, MD [facility] [place] [facility] [phone] pager   [phone] cell 07/30/2015, 3:36 PM

## 2015-07-30 NOTE — Progress Notes (Signed)
Signed order from Dr. Megan Salon to complete IV Invanz after last dose on 07/31/15 and to remove PICC after last dose faxed to [facility].

## 2015-07-30 NOTE — Assessment & Plan Note (Signed)
She has had a good response to ertapenem for treatment of her smoldering mandibular osteomyelitis. She will receive her last dose tomorrow then have her PICC pulled. She will follow-up with me in one month.

## 2015-07-30 NOTE — Progress Notes (Signed)
Patient ID: [name], female   DOB: 04-May-1946, 70 y.o.   MRN: [id number] Provider: Dr Michel Bickers Diagnosis: Osteomyelitis of mandible Procedure: patient received 1 g of Invanz IV. PICC line dressing changed.  PICC flushed with 10 cc normal saline and 250 units of heparin.  Patient ambulatory at discharge.  No distress noted.

## 2015-07-31 ENCOUNTER — Ambulatory Visit (HOSPITAL_COMMUNITY)
Admission: RE | Admit: 2015-07-31 | Discharge: 2015-07-31 | Disposition: A | Discharge status: Home / Self Care | Payer: Medicare Other | Source: Ambulatory Visit | Attending: Internal Medicine | Admitting: Internal Medicine

## 2015-07-31 DIAGNOSIS — M272 Inflammatory conditions of jaws: Secondary | ICD-10-CM | POA: Diagnosis not present

## 2015-07-31 LAB — CBC
HEMATOCRIT: 30 % — AB (ref 36.0–46.0)
HEMOGLOBIN: 9.6 g/dL — AB (ref 12.0–15.0)
MCH: 25.1 pg — AB (ref 26.0–34.0)
MCHC: 32 g/dL (ref 30.0–36.0)
MCV: 78.3 fL (ref 78.0–100.0)
Platelets: 240 10*3/uL (ref 150–400)
RBC: 3.83 MIL/uL — ABNORMAL LOW (ref 3.87–5.11)
RDW: 13.1 % (ref 11.5–15.5)
WBC: 10 10*3/uL (ref 4.0–10.5)

## 2015-07-31 LAB — BASIC METABOLIC PANEL
Anion gap: 10 (ref 5–15)
BUN: 26 mg/dL — AB (ref 6–20)
CALCIUM: 9.2 mg/dL (ref 8.9–10.3)
CHLORIDE: 107 mmol/L (ref 101–111)
CO2: 24 mmol/L (ref 22–32)
CREATININE: 0.75 mg/dL (ref 0.44–1.00)
GFR calc Af Amer: >60 mL/min (ref >60)
GFR calc non Af Amer: >60 mL/min (ref >60)
GLUCOSE: 108 mg/dL — AB (ref 65–99)
Potassium: 4.2 mmol/L (ref 3.5–5.1)
Sodium: 141 mmol/L (ref 135–145)

## 2015-07-31 LAB — SEDIMENTATION RATE: Sed Rate: 20 mm/hr (ref 0–22)

## 2015-07-31 LAB — C-REACTIVE PROTEIN: CRP: 0.6 mg/dL (ref <1.0)

## 2015-07-31 MED ORDER — HEPARIN SOD (PORK) LOCK FLUSH 100 UNIT/ML IV SOLN
250.0000 [IU] | INTRAVENOUS | Status: AC | PRN
  Administered 2015-07-31: 250 [IU]
  Filled 2015-07-31: qty 5

## 2015-07-31 MED ORDER — ERTAPENEM SODIUM 1 G IJ SOLR
1.0000 g | Freq: Once | INTRAMUSCULAR | Status: AC
  Administered 2015-07-31: 1 g via INTRAVENOUS
  Filled 2015-07-31: qty 1

## 2015-07-31 MED ORDER — SODIUM CHLORIDE 0.9 % IJ SOLN
10.0000 mL | INTRAMUSCULAR | Status: AC | PRN
Start: 2015-07-31 — End: 2015-07-31
  Administered 2015-07-31: 10 mL

## 2015-07-31 NOTE — Progress Notes (Signed)
Ordering MD: Michel Bickers Associated Diagnosis: osteomyelitis of mandible Procedure: IV infusion of Invanz; PICC line flush. PICC line to be discontinued today. This is the last Invanz dose.  Pt tolerated well; no complications noted

## 2015-07-31 NOTE — Progress Notes (Signed)
PICC line removed per order.  Patient in no acute distress.  Patient instructed to leave arm flat for 30 mins. Patient instructed on what to do if bleeding occurs after discharge.  Patient verbalizes understanding.

## 2015-08-21 ENCOUNTER — Telehealth: Payer: Self-pay | Admitting: *Deleted

## 2015-08-21 NOTE — Telephone Encounter (Signed)
Pt having difficulty getting Medicare to cover last CT scan.  Pt stated that the scan was ordered as a "routine scan" not diagnostic or follow-up so Medicare is not paying.  Pt is requesting assistance with informing Medicare that the scan was diagnostic/follow-up of osteomyelitis of the mandible.  RN made a copy of the original order.  Called the pt so that she can come by [facility] to pick it up to provide to Medicare.  There is no mention on the order of this being a "routine procedure/scan."

## 2015-08-27 ENCOUNTER — Ambulatory Visit (INDEPENDENT_AMBULATORY_CARE_PROVIDER_SITE_OTHER): Payer: Medicare Other | Admitting: Internal Medicine

## 2015-08-27 ENCOUNTER — Encounter: Payer: Self-pay | Admitting: Internal Medicine

## 2015-08-27 VITALS — BP 134/76 | HR 103 | Temp 97.3°F | Wt 122.0 lb

## 2015-08-27 DIAGNOSIS — M272 Inflammatory conditions of jaws: Secondary | ICD-10-CM

## 2015-08-27 NOTE — Progress Notes (Signed)
[facility] for Infectious Disease  Patient Active Problem List   Diagnosis Date Noted  . 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 Rheumatoid arthritis ([place]) 03/29/2015  . Anxiety 03/29/2015  . Hypothyroidism 03/29/2015  . Hyperglycemia 03/29/2015    Patient's Medications  New Prescriptions   No medications on file  Previous Medications   CITALOPRAM (CELEXA) 10 MG TABLET    Take 10 mg by mouth at bedtime.   FOLIC ACID (FOLVITE) Q000111Q MCG TABLET    Take 800 mcg by mouth daily.   IBUPROFEN (ADVIL,MOTRIN) 200 MG TABLET    Take 200 mg by mouth every 6 (six) hours as needed.   LEVOTHYROXINE (SYNTHROID, LEVOTHROID) 100 MCG TABLET    Take 88 mcg by mouth daily before breakfast.    MELATONIN 10 MG CAPS    Take 10 mg by mouth.   NONFORMULARY OR COMPOUNDED ITEM    every 8 (eight) weeks. [facility] every other month   RESTASIS 0.05 % OPHTHALMIC EMULSION      Modified Medications   No medications on file  Discontinued Medications   No medications on file    Subjective: Ms. Carrolyn Meiers is in for her routine follow-up visit. She completed 6 weeks of IV ertapenem on 07/31/2015. She has had no recurrent swelling or return of pain in her right jaw. She's had no drainage. The numbness of the right side of her jaw is unchanged.  Review of Systems: Review of Systems  Constitutional: Negative for fever, chills and diaphoresis.  HENT:       As noted in history of present illness.  Skin: Negative for rash.    Past Medical History  Diagnosis Date  . Osteomyelitis (HCC)     Mandible, right lower  . Hypothyroidism   . Arthritis   . Complication of anesthesia   . PONV (postoperative nausea and vomiting)   . Depression     Social History  Substance Use Topics  . Smoking status: Former Research scientist (life sciences)  . Smokeless tobacco: Never Used  . Alcohol Use: No    No family history on file.  Allergies  Allergen Reactions  . Remicade [Infliximab] Shortness Of  Breath    Objective: Filed Vitals:   08/27/15 1025  BP: 134/76  Pulse: 103  Temp: 97.3 F (36.3 C)  TempSrc: Oral  Weight: 122 lb (55.339 kg)   Body mass index is 22.31 kg/(m^2).  Physical Exam  Constitutional:  She is smiling and in good spirits.  HENT:  Mouth/Throat: No oropharyngeal exudate.  She has no swelling, redness or pain along her right jawline. The previous sinus tract under her jaw has healed. The anterior sinus tract along her bottom gumline on the right has healed. There is no unusual swelling or redness of her gums. She has no tenderness with palpation of her right lower teeth.    Lab Results SED RATE (mm/hr)  Date Value  07/31/2015 20  07/24/2015 46*  07/17/2015 36*   CRP (mg/dL)  Date Value  07/31/2015 0.6  07/24/2015 1.1*  07/17/2015 1.0*     Problem List Items Addressed This Visit      High   Osteomyelitis of mandible - Primary    I'm hopeful that her osteomyelitis of her mandible and dental abscess have now been cured. She will continue off of antibiotics and follow-up with me in 6 weeks.          Michel Bickers, MD [facility] [phone] pager   [phone] cell 08/27/2015, 10:33 AM

## 2015-08-27 NOTE — Assessment & Plan Note (Signed)
I'm hopeful that her osteomyelitis of her mandible and dental abscess have now been cured. She will continue off of antibiotics and follow-up with me in 6 weeks.

## 2015-10-09 ENCOUNTER — Ambulatory Visit: Payer: Medicare Other | Admitting: Internal Medicine

## 2015-10-17 DIAGNOSIS — M0589 Other rheumatoid arthritis with rheumatoid factor of multiple sites: Secondary | ICD-10-CM | POA: Diagnosis not present

## 2015-10-17 DIAGNOSIS — M3500 Sicca syndrome, unspecified: Secondary | ICD-10-CM | POA: Diagnosis not present

## 2015-10-17 DIAGNOSIS — M810 Age-related osteoporosis without current pathological fracture: Secondary | ICD-10-CM | POA: Diagnosis not present

## 2015-10-17 DIAGNOSIS — M150 Primary generalized (osteo)arthritis: Secondary | ICD-10-CM | POA: Diagnosis not present

## 2015-11-01 DIAGNOSIS — M0589 Other rheumatoid arthritis with rheumatoid factor of multiple sites: Secondary | ICD-10-CM | POA: Diagnosis not present

## 2015-11-18 DIAGNOSIS — M810 Age-related osteoporosis without current pathological fracture: Secondary | ICD-10-CM | POA: Diagnosis not present

## 2015-11-18 DIAGNOSIS — E039 Hypothyroidism, unspecified: Secondary | ICD-10-CM | POA: Diagnosis not present

## 2015-11-18 DIAGNOSIS — E559 Vitamin D deficiency, unspecified: Secondary | ICD-10-CM | POA: Diagnosis not present

## 2015-11-25 DIAGNOSIS — D509 Iron deficiency anemia, unspecified: Secondary | ICD-10-CM | POA: Diagnosis not present

## 2015-11-25 DIAGNOSIS — F17211 Nicotine dependence, cigarettes, in remission: Secondary | ICD-10-CM | POA: Diagnosis not present

## 2015-11-25 DIAGNOSIS — E039 Hypothyroidism, unspecified: Secondary | ICD-10-CM | POA: Diagnosis not present

## 2015-11-25 DIAGNOSIS — F339 Major depressive disorder, recurrent, unspecified: Secondary | ICD-10-CM | POA: Diagnosis not present

## 2015-11-25 DIAGNOSIS — M810 Age-related osteoporosis without current pathological fracture: Secondary | ICD-10-CM | POA: Diagnosis not present

## 2015-11-28 DIAGNOSIS — D508 Other iron deficiency anemias: Secondary | ICD-10-CM | POA: Diagnosis not present

## 2015-11-29 DIAGNOSIS — M0589 Other rheumatoid arthritis with rheumatoid factor of multiple sites: Secondary | ICD-10-CM | POA: Diagnosis not present

## 2015-12-17 DIAGNOSIS — M0589 Other rheumatoid arthritis with rheumatoid factor of multiple sites: Secondary | ICD-10-CM | POA: Diagnosis not present

## 2015-12-17 DIAGNOSIS — N182 Chronic kidney disease, stage 2 (mild): Secondary | ICD-10-CM | POA: Diagnosis not present

## 2015-12-17 DIAGNOSIS — M150 Primary generalized (osteo)arthritis: Secondary | ICD-10-CM | POA: Diagnosis not present

## 2015-12-17 DIAGNOSIS — M810 Age-related osteoporosis without current pathological fracture: Secondary | ICD-10-CM | POA: Diagnosis not present

## 2015-12-23 DIAGNOSIS — H35433 Paving stone degeneration of retina, bilateral: Secondary | ICD-10-CM | POA: Diagnosis not present

## 2015-12-23 DIAGNOSIS — H35373 Puckering of macula, bilateral: Secondary | ICD-10-CM | POA: Diagnosis not present

## 2015-12-23 DIAGNOSIS — H35343 Macular cyst, hole, or pseudohole, bilateral: Secondary | ICD-10-CM | POA: Diagnosis not present

## 2015-12-23 DIAGNOSIS — H43813 Vitreous degeneration, bilateral: Secondary | ICD-10-CM | POA: Diagnosis not present

## 2015-12-26 DIAGNOSIS — D509 Iron deficiency anemia, unspecified: Secondary | ICD-10-CM | POA: Diagnosis not present

## 2015-12-26 DIAGNOSIS — K259 Gastric ulcer, unspecified as acute or chronic, without hemorrhage or perforation: Secondary | ICD-10-CM | POA: Diagnosis not present

## 2015-12-26 DIAGNOSIS — K2950 Unspecified chronic gastritis without bleeding: Secondary | ICD-10-CM | POA: Diagnosis not present

## 2015-12-26 DIAGNOSIS — K2990 Gastroduodenitis, unspecified, without bleeding: Secondary | ICD-10-CM | POA: Diagnosis not present

## 2015-12-26 DIAGNOSIS — K5730 Diverticulosis of large intestine without perforation or abscess without bleeding: Secondary | ICD-10-CM | POA: Diagnosis not present

## 2016-01-27 DIAGNOSIS — M0589 Other rheumatoid arthritis with rheumatoid factor of multiple sites: Secondary | ICD-10-CM | POA: Diagnosis not present

## 2016-01-28 DIAGNOSIS — H04123 Dry eye syndrome of bilateral lacrimal glands: Secondary | ICD-10-CM | POA: Diagnosis not present

## 2016-01-28 DIAGNOSIS — H2513 Age-related nuclear cataract, bilateral: Secondary | ICD-10-CM | POA: Diagnosis not present

## 2016-01-28 DIAGNOSIS — H16143 Punctate keratitis, bilateral: Secondary | ICD-10-CM | POA: Diagnosis not present

## 2016-01-28 DIAGNOSIS — H35433 Paving stone degeneration of retina, bilateral: Secondary | ICD-10-CM | POA: Diagnosis not present

## 2016-01-29 DIAGNOSIS — D508 Other iron deficiency anemias: Secondary | ICD-10-CM | POA: Diagnosis not present

## 2016-01-29 DIAGNOSIS — K259 Gastric ulcer, unspecified as acute or chronic, without hemorrhage or perforation: Secondary | ICD-10-CM | POA: Diagnosis not present

## 2016-02-17 DIAGNOSIS — H16223 Keratoconjunctivitis sicca, not specified as Sjogren's, bilateral: Secondary | ICD-10-CM | POA: Diagnosis not present

## 2016-03-02 DIAGNOSIS — H16143 Punctate keratitis, bilateral: Secondary | ICD-10-CM | POA: Diagnosis not present

## 2016-03-12 DIAGNOSIS — D509 Iron deficiency anemia, unspecified: Secondary | ICD-10-CM | POA: Diagnosis not present

## 2016-03-17 DIAGNOSIS — H04123 Dry eye syndrome of bilateral lacrimal glands: Secondary | ICD-10-CM | POA: Diagnosis not present

## 2016-03-17 DIAGNOSIS — H2513 Age-related nuclear cataract, bilateral: Secondary | ICD-10-CM | POA: Diagnosis not present

## 2016-03-18 DIAGNOSIS — M810 Age-related osteoporosis without current pathological fracture: Secondary | ICD-10-CM | POA: Diagnosis not present

## 2016-03-18 DIAGNOSIS — M150 Primary generalized (osteo)arthritis: Secondary | ICD-10-CM | POA: Diagnosis not present

## 2016-03-18 DIAGNOSIS — N182 Chronic kidney disease, stage 2 (mild): Secondary | ICD-10-CM | POA: Diagnosis not present

## 2016-03-18 DIAGNOSIS — M0589 Other rheumatoid arthritis with rheumatoid factor of multiple sites: Secondary | ICD-10-CM | POA: Diagnosis not present

## 2016-03-23 DIAGNOSIS — M0589 Other rheumatoid arthritis with rheumatoid factor of multiple sites: Secondary | ICD-10-CM | POA: Diagnosis not present

## 2016-05-13 DIAGNOSIS — M0589 Other rheumatoid arthritis with rheumatoid factor of multiple sites: Secondary | ICD-10-CM | POA: Diagnosis not present

## 2016-05-27 DIAGNOSIS — F339 Major depressive disorder, recurrent, unspecified: Secondary | ICD-10-CM | POA: Diagnosis not present

## 2016-05-27 DIAGNOSIS — M810 Age-related osteoporosis without current pathological fracture: Secondary | ICD-10-CM | POA: Diagnosis not present

## 2016-05-27 DIAGNOSIS — Z0000 Encounter for general adult medical examination without abnormal findings: Secondary | ICD-10-CM | POA: Diagnosis not present

## 2016-05-27 DIAGNOSIS — D509 Iron deficiency anemia, unspecified: Secondary | ICD-10-CM | POA: Diagnosis not present

## 2016-05-27 DIAGNOSIS — N182 Chronic kidney disease, stage 2 (mild): Secondary | ICD-10-CM | POA: Diagnosis not present

## 2016-05-27 DIAGNOSIS — E039 Hypothyroidism, unspecified: Secondary | ICD-10-CM | POA: Diagnosis not present

## 2016-06-08 DIAGNOSIS — E039 Hypothyroidism, unspecified: Secondary | ICD-10-CM | POA: Diagnosis not present

## 2016-06-08 DIAGNOSIS — F339 Major depressive disorder, recurrent, unspecified: Secondary | ICD-10-CM | POA: Diagnosis not present

## 2016-06-08 DIAGNOSIS — M810 Age-related osteoporosis without current pathological fracture: Secondary | ICD-10-CM | POA: Diagnosis not present

## 2016-06-08 DIAGNOSIS — N182 Chronic kidney disease, stage 2 (mild): Secondary | ICD-10-CM | POA: Diagnosis not present

## 2016-06-16 DIAGNOSIS — H2512 Age-related nuclear cataract, left eye: Secondary | ICD-10-CM | POA: Diagnosis not present

## 2016-06-16 DIAGNOSIS — H2513 Age-related nuclear cataract, bilateral: Secondary | ICD-10-CM | POA: Diagnosis not present

## 2016-06-16 DIAGNOSIS — H25013 Cortical age-related cataract, bilateral: Secondary | ICD-10-CM | POA: Diagnosis not present

## 2016-07-08 DIAGNOSIS — M0589 Other rheumatoid arthritis with rheumatoid factor of multiple sites: Secondary | ICD-10-CM | POA: Diagnosis not present

## 2016-07-14 DIAGNOSIS — L309 Dermatitis, unspecified: Secondary | ICD-10-CM | POA: Diagnosis not present

## 2016-07-27 DIAGNOSIS — H2512 Age-related nuclear cataract, left eye: Secondary | ICD-10-CM | POA: Diagnosis not present

## 2016-07-27 DIAGNOSIS — H2511 Age-related nuclear cataract, right eye: Secondary | ICD-10-CM | POA: Diagnosis not present

## 2016-07-28 DIAGNOSIS — H2511 Age-related nuclear cataract, right eye: Secondary | ICD-10-CM | POA: Diagnosis not present

## 2016-08-10 DIAGNOSIS — H2511 Age-related nuclear cataract, right eye: Secondary | ICD-10-CM | POA: Diagnosis not present

## 2016-08-25 DIAGNOSIS — L570 Actinic keratosis: Secondary | ICD-10-CM | POA: Diagnosis not present

## 2016-09-02 DIAGNOSIS — M272 Inflammatory conditions of jaws: Secondary | ICD-10-CM | POA: Diagnosis not present

## 2016-09-02 DIAGNOSIS — M069 Rheumatoid arthritis, unspecified: Secondary | ICD-10-CM | POA: Diagnosis not present

## 2016-09-02 DIAGNOSIS — M0589 Other rheumatoid arthritis with rheumatoid factor of multiple sites: Secondary | ICD-10-CM | POA: Diagnosis not present

## 2016-09-27 ENCOUNTER — Other Ambulatory Visit: Payer: Self-pay

## 2016-09-27 ENCOUNTER — Encounter (HOSPITAL_COMMUNITY): Payer: Self-pay

## 2016-09-27 ENCOUNTER — Emergency Department (HOSPITAL_COMMUNITY)
Admission: EM | Admit: 2016-09-27 | Discharge: 2016-09-27 | Disposition: A | Discharge status: Home / Self Care | Payer: Medicare Other | Attending: Emergency Medicine | Admitting: Emergency Medicine

## 2016-09-27 DIAGNOSIS — R112 Nausea with vomiting, unspecified: Secondary | ICD-10-CM | POA: Diagnosis not present

## 2016-09-27 DIAGNOSIS — R9431 Abnormal electrocardiogram [ECG] [EKG]: Secondary | ICD-10-CM | POA: Diagnosis not present

## 2016-09-27 DIAGNOSIS — Z87891 Personal history of nicotine dependence: Secondary | ICD-10-CM | POA: Insufficient documentation

## 2016-09-27 DIAGNOSIS — Z79899 Other long term (current) drug therapy: Secondary | ICD-10-CM | POA: Diagnosis not present

## 2016-09-27 DIAGNOSIS — E039 Hypothyroidism, unspecified: Secondary | ICD-10-CM | POA: Insufficient documentation

## 2016-09-27 DIAGNOSIS — R531 Weakness: Secondary | ICD-10-CM | POA: Diagnosis not present

## 2016-09-27 DIAGNOSIS — R42 Dizziness and giddiness: Secondary | ICD-10-CM | POA: Diagnosis not present

## 2016-09-27 DIAGNOSIS — R404 Transient alteration of awareness: Secondary | ICD-10-CM | POA: Diagnosis not present

## 2016-09-27 DIAGNOSIS — R110 Nausea: Secondary | ICD-10-CM

## 2016-09-27 LAB — COMPREHENSIVE METABOLIC PANEL
ALT: 19 U/L (ref 14–54)
AST: 25 U/L (ref 15–41)
Albumin: 4.3 g/dL (ref 3.5–5.0)
Alkaline Phosphatase: 35 U/L — ABNORMAL LOW (ref 38–126)
Anion gap: 7 (ref 5–15)
BILIRUBIN TOTAL: 2.1 mg/dL — AB (ref 0.3–1.2)
BUN: 19 mg/dL (ref 6–20)
CALCIUM: 9.2 mg/dL (ref 8.9–10.3)
CHLORIDE: 109 mmol/L (ref 101–111)
CO2: 26 mmol/L (ref 22–32)
CREATININE: 0.59 mg/dL (ref 0.44–1.00)
Glucose, Bld: 91 mg/dL (ref 65–99)
Potassium: 4.5 mmol/L (ref 3.5–5.1)
Sodium: 142 mmol/L (ref 135–145)
TOTAL PROTEIN: 7.2 g/dL (ref 6.5–8.1)

## 2016-09-27 LAB — CBC WITH DIFFERENTIAL/PLATELET
Basophils Absolute: 0 10*3/uL (ref 0.0–0.1)
Basophils Relative: 0 %
EOS PCT: 2 %
Eosinophils Absolute: 0.2 10*3/uL (ref 0.0–0.7)
HCT: 37.4 % (ref 36.0–46.0)
Hemoglobin: 12.6 g/dL (ref 12.0–15.0)
LYMPHS ABS: 2 10*3/uL (ref 0.7–4.0)
Lymphocytes Relative: 28 %
MCH: 29 pg (ref 26.0–34.0)
MCHC: 33.7 g/dL (ref 30.0–36.0)
MCV: 86.2 fL (ref 78.0–100.0)
MONO ABS: 0.5 10*3/uL (ref 0.1–1.0)
Monocytes Relative: 8 %
Neutro Abs: 4.3 10*3/uL (ref 1.7–7.7)
Neutrophils Relative %: 62 %
PLATELETS: 194 10*3/uL (ref 150–400)
RBC: 4.34 MIL/uL (ref 3.87–5.11)
RDW: 12.8 % (ref 11.5–15.5)
WBC: 7 10*3/uL (ref 4.0–10.5)

## 2016-09-27 LAB — URINALYSIS, ROUTINE W REFLEX MICROSCOPIC
Bacteria, UA: NONE SEEN
Bilirubin Urine: NEGATIVE
Glucose, UA: NEGATIVE mg/dL
Hgb urine dipstick: NEGATIVE
Ketones, ur: 80 mg/dL — AB
Nitrite: NEGATIVE
PH: 6 (ref 5.0–8.0)
Protein, ur: NEGATIVE mg/dL
SPECIFIC GRAVITY, URINE: 1.013 (ref 1.005–1.030)

## 2016-09-27 LAB — LIPASE, BLOOD: LIPASE: 30 U/L (ref 11–51)

## 2016-09-27 LAB — I-STAT TROPONIN, ED: Troponin i, poc: 0 ng/mL (ref 0.00–0.08)

## 2016-09-27 MED ORDER — ONDANSETRON HCL 4 MG/2ML IJ SOLN
4.0000 mg | Freq: Once | INTRAMUSCULAR | Status: AC
  Administered 2016-09-27: 4 mg via INTRAVENOUS
  Filled 2016-09-27: qty 2

## 2016-09-27 MED ORDER — ONDANSETRON 4 MG PO TBDP: 4.0000 mg | ORAL_TABLET | Freq: Three times a day (TID) | ORAL | 0 refills | Status: DC | PRN

## 2016-09-27 MED ORDER — SODIUM CHLORIDE 0.9 % IV BOLUS (SEPSIS)
1000.0000 mL | Freq: Once | INTRAVENOUS | Status: AC
  Administered 2016-09-27: 1000 mL via INTRAVENOUS

## 2016-09-27 NOTE — ED Notes (Signed)
Pt continues to have nausea, provider made aware and pt was given Zofran. Pt informed urine specimen is needed,

## 2016-09-27 NOTE — ED Notes (Signed)
Bedside reporting done with Kandice Moos, RN.

## 2016-09-27 NOTE — ED Triage Notes (Addendum)
Patient here with c/o nausea and vomiting x 2 days. Pt is coming  from urgent care by ems. Pt denies pain and fever.  Pt was given 4 mg of Zofran at around 4:15 at the clinic for nausea.

## 2016-09-27 NOTE — ED Notes (Signed)
Bed: PH43 Expected date: 09/27/16 Expected time: 4:47 PM Means of arrival: Ambulance Comments: Nausea/ dizziness

## 2016-09-27 NOTE — ED Provider Notes (Signed)
[facility] DEPT Provider Note   CSN: [id number] Arrival date & time: 09/27/16  1658     History   Chief Complaint Chief Complaint  Patient presents with  . Nausea  . Emesis    HPI [name] is a 71 y.o. female presenting via EMS from urgent care with 2 days of nausea and vomiting. She denies cold symptoms. She hasn't been able to eat or drink the past couple days. She was given Zofran and urgent care and doesn't feel nauseated this time. She thinks she may be dehydrated.  She denies any abdominal pain, chest pain, shortness of breath, melena, dysuria, hematuria or any other symptoms.  HPI  Past Medical History:  Diagnosis Date  . Arthritis   . Complication of anesthesia   . Depression   . Hypothyroidism   . Osteomyelitis (HCC)    Mandible, right lower  . PONV (postoperative nausea and vomiting)     Patient Active Problem List   Diagnosis Date Noted  . 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 Rheumatoid arthritis ([place]) 03/29/2015  . Anxiety 03/29/2015  . Hypothyroidism 03/29/2015  . Hyperglycemia 03/29/2015    Past Surgical History:  Procedure Laterality Date  . HAND SURGERY Bilateral    Synovial tissue removed  . TOOTH EXTRACTION Right Oct 2016   Then had to have fluid drained from abcess x 2    OB History    No data available       Home Medications    Prior to Admission medications   Medication Sig Start Date End Date Taking? Authorizing Provider  alendronate (FOSAMAX) 70 MG tablet Take 70 mg by mouth once a week. 09/05/16  Yes Historical Provider, MD  cholecalciferol (VITAMIN D) 1000 units tablet Take 1,000 Units by mouth daily.   Yes Historical Provider, MD  folic acid (FOLVITE) 656 MCG tablet Take 800 mcg by mouth daily.   Yes Historical Provider, MD  ibuprofen (ADVIL,MOTRIN) 200 MG tablet Take 400 mg by mouth every 6 (six) hours as needed.    Yes Historical Provider, MD  levothyroxine (SYNTHROID, LEVOTHROID) 88 MCG tablet Take 88 mcg by  mouth daily before breakfast.    Yes Historical Provider, MD  Melatonin 10 MG CAPS Take 10 mg by mouth at bedtime.    Yes Historical Provider, MD  methotrexate (RHEUMATREX) 2.5 MG tablet Take 15 mg by mouth once a week. Takes 6 tablets weekly. 09/02/16  Yes Historical Provider, MD  NONFORMULARY OR COMPOUNDED ITEM every 8 (eight) weeks. [facility] every other month   Yes Historical Provider, MD  RESTASIS 0.05 % ophthalmic emulsion Place 1 drop into both eyes 2 (two) times daily.  01/10/15  Yes Historical Provider, MD  ondansetron (ZOFRAN ODT) 4 MG disintegrating tablet Take 1 tablet (4 mg total) by mouth every 8 (eight) hours as needed for nausea or vomiting. 09/27/16   Emeline General, PA-C    Family History History reviewed. No pertinent family history.  Social History Social History  Substance Use Topics  . Smoking status: Former Research scientist (life sciences)  . Smokeless tobacco: Never Used  . Alcohol use No     Allergies   Remicade [infliximab]   Review of Systems Review of Systems  Constitutional: Negative for chills and fever.  HENT: Negative for congestion, ear pain and sore throat.   Eyes: Negative for visual disturbance.  Respiratory: Negative for cough, choking, chest tightness, shortness of breath, wheezing and stridor.   Cardiovascular: Negative for chest pain, palpitations and leg swelling.  Gastrointestinal: Positive for nausea and vomiting. Negative for abdominal distention, abdominal pain, anal bleeding, blood in stool and diarrhea.  Genitourinary: Negative for difficulty urinating, dysuria, flank pain and hematuria.  Musculoskeletal: Negative for gait problem, myalgias, neck pain and neck stiffness.  Skin: Negative for color change, pallor, rash and wound.  Neurological: Positive for light-headedness. Negative for dizziness, syncope, speech difficulty, weakness, numbness and headaches.  Psychiatric/Behavioral: Negative for behavioral problems.     Physical Exam Updated  Vital Signs BP (!) 141/86 (BP Location: Left Arm)   Pulse (!) 101   Temp 98.5 F (36.9 C) (Oral)   Resp 18   Ht 5\' 2"  (1.575 m)   Wt 59 kg   SpO2 95%   BMI 23.78 kg/m   Physical Exam  Constitutional: She appears well-developed and well-nourished. No distress.  Patient is afebrile, nontoxic appearing sitting comfortably in bed in no acute distress  HENT:  Head: Normocephalic and atraumatic.  Eyes: Conjunctivae and EOM are normal.  Neck: Normal range of motion. Neck supple. No JVD present.  Cardiovascular: Normal rate, regular rhythm and normal heart sounds.   No murmur heard. Pulmonary/Chest: Effort normal and breath sounds normal. No respiratory distress. She has no wheezes. She has no rales.  Abdominal: Soft. She exhibits no distension and no mass. There is no tenderness. There is no guarding.  Hyperactive bowel sounds  Musculoskeletal: She exhibits no edema, tenderness or deformity.  Neurological: She is alert.  Skin: Skin is warm and dry. No rash noted. She is not diaphoretic. No erythema. No pallor.  Psychiatric: She has a normal mood and affect. Her behavior is normal.  Nursing note and vitals reviewed.    ED Treatments / Results  Labs (all labs ordered are listed, but only abnormal results are displayed) Labs Reviewed  COMPREHENSIVE METABOLIC PANEL - Abnormal; Notable for the following:       Result Value   Alkaline Phosphatase 35 (*)    Total Bilirubin 2.1 (*)    All other components within normal limits  URINALYSIS, ROUTINE W REFLEX MICROSCOPIC - Abnormal; Notable for the following:    Ketones, ur 80 (*)    Leukocytes, UA TRACE (*)    Squamous Epithelial / LPF 0-5 (*)    All other components within normal limits  CBC WITH DIFFERENTIAL/PLATELET  LIPASE, BLOOD  I-STAT TROPOININ, ED    EKG  EKG Interpretation  Date/Time:  Sunday September 27 2016 18:02:35 EDT Ventricular Rate:  95 PR Interval:    QRS Duration: 93 QT Interval:  380 QTC Calculation: 478 R  Axis:   -9 Text Interpretation:  Sinus rhythm Anterior infarct, old No significant change since last tracing Confirmed by ISAACS MD, Lysbeth Galas [phone]) on 09/27/2016 6:04:58 PM       Radiology No results found.  Procedures Procedures (including critical care time)  Medications Ordered in ED Medications  sodium chloride 0.9 % bolus 1,000 mL (0 mLs Intravenous Stopped 09/27/16 2056)  ondansetron (ZOFRAN) injection 4 mg (4 mg Intravenous Given 09/27/16 1938)     Initial Impression / Assessment and Plan / ED Course  I have reviewed the triage vital signs and the nursing notes.  Pertinent labs & imaging results that were available during my care of the patient were reviewed by me and considered in my medical decision making (see chart for details).     Patient presents via EMS from urgent care with nausea vomiting 2 days. She is no longer nauseated on arrival after 4 mg Zofran. She  hasn't been able to eat or drink in the last couple days. Exam is unremarkable. Patient is afebrile and nontoxic appearing. She was comfortable.  Patient given IV fluids Labs were unremarkable. ekg reviewed with Dr. Ellender Hose and unremarkable. Patient had nausea again in Ed and was given zofran. Patient was reassessed after fluids and zofran and reports improvement. She was feeling better and asked to go home. She refused completion of fluids.  Discharge home with symptomatic relief and close PCP follow up. Discussed strict return precautions and advised to return to the emergency department if experiencing any new or worsening symptoms. Instructions were understood and patient agreed with discharge plan.   Final Clinical Impressions(s) / ED Diagnoses   Final diagnoses:  Nausea  Non-intractable vomiting with nausea, unspecified vomiting type    New Prescriptions New Prescriptions   ONDANSETRON (ZOFRAN ODT) 4 MG DISINTEGRATING TABLET    Take 1 tablet (4 mg total) by mouth every 8 (eight) hours as needed  for nausea or vomiting.     Emeline General, PA-C 09/27/16 2112    Duffy Bruce, MD 09/28/16 [phone]

## 2016-09-27 NOTE — Discharge Instructions (Signed)
Stay well hydrated and slowly reintroduce food starting with broths and blend foods. Follow up with your primary care provider. Return to the emergency department if your nausea/vomiting persists despite medications, fever, chills, or any other concerning symptoms.

## 2016-10-29 DIAGNOSIS — M0589 Other rheumatoid arthritis with rheumatoid factor of multiple sites: Secondary | ICD-10-CM | POA: Diagnosis not present

## 2016-11-24 DIAGNOSIS — L814 Other melanin hyperpigmentation: Secondary | ICD-10-CM | POA: Diagnosis not present

## 2016-11-24 DIAGNOSIS — L821 Other seborrheic keratosis: Secondary | ICD-10-CM | POA: Diagnosis not present

## 2016-11-24 DIAGNOSIS — D239 Other benign neoplasm of skin, unspecified: Secondary | ICD-10-CM | POA: Diagnosis not present

## 2016-11-24 DIAGNOSIS — D1801 Hemangioma of skin and subcutaneous tissue: Secondary | ICD-10-CM | POA: Diagnosis not present

## 2016-12-01 DIAGNOSIS — M0589 Other rheumatoid arthritis with rheumatoid factor of multiple sites: Secondary | ICD-10-CM | POA: Diagnosis not present

## 2016-12-01 DIAGNOSIS — E039 Hypothyroidism, unspecified: Secondary | ICD-10-CM | POA: Diagnosis not present

## 2016-12-01 DIAGNOSIS — Z79899 Other long term (current) drug therapy: Secondary | ICD-10-CM | POA: Diagnosis not present

## 2016-12-01 DIAGNOSIS — M810 Age-related osteoporosis without current pathological fracture: Secondary | ICD-10-CM | POA: Diagnosis not present

## 2016-12-01 DIAGNOSIS — N182 Chronic kidney disease, stage 2 (mild): Secondary | ICD-10-CM | POA: Diagnosis not present

## 2016-12-01 DIAGNOSIS — M150 Primary generalized (osteo)arthritis: Secondary | ICD-10-CM | POA: Diagnosis not present

## 2016-12-08 DIAGNOSIS — E039 Hypothyroidism, unspecified: Secondary | ICD-10-CM | POA: Diagnosis not present

## 2016-12-08 DIAGNOSIS — J4520 Mild intermittent asthma, uncomplicated: Secondary | ICD-10-CM | POA: Diagnosis not present

## 2016-12-08 DIAGNOSIS — J309 Allergic rhinitis, unspecified: Secondary | ICD-10-CM | POA: Diagnosis not present

## 2016-12-08 DIAGNOSIS — M810 Age-related osteoporosis without current pathological fracture: Secondary | ICD-10-CM | POA: Diagnosis not present

## 2016-12-22 DIAGNOSIS — H33193 Other retinoschisis and retinal cysts, bilateral: Secondary | ICD-10-CM | POA: Diagnosis not present

## 2016-12-22 DIAGNOSIS — H35343 Macular cyst, hole, or pseudohole, bilateral: Secondary | ICD-10-CM | POA: Diagnosis not present

## 2016-12-22 DIAGNOSIS — H35373 Puckering of macula, bilateral: Secondary | ICD-10-CM | POA: Diagnosis not present

## 2016-12-22 DIAGNOSIS — H43813 Vitreous degeneration, bilateral: Secondary | ICD-10-CM | POA: Diagnosis not present

## 2016-12-24 DIAGNOSIS — M0589 Other rheumatoid arthritis with rheumatoid factor of multiple sites: Secondary | ICD-10-CM | POA: Diagnosis not present

## 2016-12-24 DIAGNOSIS — R5383 Other fatigue: Secondary | ICD-10-CM | POA: Diagnosis not present

## 2017-02-08 DIAGNOSIS — E039 Hypothyroidism, unspecified: Secondary | ICD-10-CM | POA: Diagnosis not present

## 2017-02-08 DIAGNOSIS — M0589 Other rheumatoid arthritis with rheumatoid factor of multiple sites: Secondary | ICD-10-CM | POA: Diagnosis not present

## 2017-02-08 DIAGNOSIS — J4520 Mild intermittent asthma, uncomplicated: Secondary | ICD-10-CM | POA: Diagnosis not present

## 2017-02-18 DIAGNOSIS — M0589 Other rheumatoid arthritis with rheumatoid factor of multiple sites: Secondary | ICD-10-CM | POA: Diagnosis not present

## 2017-03-03 DIAGNOSIS — M0589 Other rheumatoid arthritis with rheumatoid factor of multiple sites: Secondary | ICD-10-CM | POA: Diagnosis not present

## 2017-03-03 DIAGNOSIS — M150 Primary generalized (osteo)arthritis: Secondary | ICD-10-CM | POA: Diagnosis not present

## 2017-03-03 DIAGNOSIS — M810 Age-related osteoporosis without current pathological fracture: Secondary | ICD-10-CM | POA: Diagnosis not present

## 2017-03-03 DIAGNOSIS — Z79899 Other long term (current) drug therapy: Secondary | ICD-10-CM | POA: Diagnosis not present

## 2017-04-12 DIAGNOSIS — F458 Other somatoform disorders: Secondary | ICD-10-CM | POA: Diagnosis not present

## 2017-04-12 DIAGNOSIS — F418 Other specified anxiety disorders: Secondary | ICD-10-CM | POA: Diagnosis not present

## 2017-04-15 DIAGNOSIS — M0589 Other rheumatoid arthritis with rheumatoid factor of multiple sites: Secondary | ICD-10-CM | POA: Diagnosis not present

## 2017-04-28 DIAGNOSIS — F458 Other somatoform disorders: Secondary | ICD-10-CM | POA: Diagnosis not present

## 2017-04-28 DIAGNOSIS — F418 Other specified anxiety disorders: Secondary | ICD-10-CM | POA: Diagnosis not present

## 2017-05-17 DIAGNOSIS — Z23 Encounter for immunization: Secondary | ICD-10-CM | POA: Diagnosis not present

## 2017-05-17 DIAGNOSIS — F418 Other specified anxiety disorders: Secondary | ICD-10-CM | POA: Diagnosis not present

## 2017-05-17 DIAGNOSIS — F458 Other somatoform disorders: Secondary | ICD-10-CM | POA: Diagnosis not present

## 2017-06-08 DIAGNOSIS — Z0000 Encounter for general adult medical examination without abnormal findings: Secondary | ICD-10-CM | POA: Diagnosis not present

## 2017-06-08 DIAGNOSIS — N390 Urinary tract infection, site not specified: Secondary | ICD-10-CM | POA: Diagnosis not present

## 2017-06-08 DIAGNOSIS — Z23 Encounter for immunization: Secondary | ICD-10-CM | POA: Diagnosis not present

## 2017-06-08 DIAGNOSIS — E039 Hypothyroidism, unspecified: Secondary | ICD-10-CM | POA: Diagnosis not present

## 2017-06-08 DIAGNOSIS — E559 Vitamin D deficiency, unspecified: Secondary | ICD-10-CM | POA: Diagnosis not present

## 2017-06-10 DIAGNOSIS — M810 Age-related osteoporosis without current pathological fracture: Secondary | ICD-10-CM | POA: Diagnosis not present

## 2017-06-10 DIAGNOSIS — J45909 Unspecified asthma, uncomplicated: Secondary | ICD-10-CM | POA: Diagnosis not present

## 2017-06-10 DIAGNOSIS — F339 Major depressive disorder, recurrent, unspecified: Secondary | ICD-10-CM | POA: Diagnosis not present

## 2017-06-10 DIAGNOSIS — E039 Hypothyroidism, unspecified: Secondary | ICD-10-CM | POA: Diagnosis not present

## 2017-06-10 DIAGNOSIS — N182 Chronic kidney disease, stage 2 (mild): Secondary | ICD-10-CM | POA: Diagnosis not present

## 2017-06-10 DIAGNOSIS — J4520 Mild intermittent asthma, uncomplicated: Secondary | ICD-10-CM | POA: Diagnosis not present

## 2017-06-14 DIAGNOSIS — F329 Major depressive disorder, single episode, unspecified: Secondary | ICD-10-CM | POA: Diagnosis not present

## 2017-06-14 DIAGNOSIS — R319 Hematuria, unspecified: Secondary | ICD-10-CM | POA: Diagnosis not present

## 2017-06-14 DIAGNOSIS — E039 Hypothyroidism, unspecified: Secondary | ICD-10-CM | POA: Diagnosis not present

## 2017-06-14 DIAGNOSIS — N390 Urinary tract infection, site not specified: Secondary | ICD-10-CM | POA: Diagnosis not present

## 2017-06-17 DIAGNOSIS — M0589 Other rheumatoid arthritis with rheumatoid factor of multiple sites: Secondary | ICD-10-CM | POA: Diagnosis not present

## 2017-07-06 IMAGING — CT CT MAXILLOFACIAL W/ CM
4 of 5 series · 17 of 37 positions shown, 19 images · IV contrast (75CC ISOVUE 300)
Comparison: None.

CLINICAL DATA: Infected tooth 02/12/2015. Redness and swelling in
right chin.

EXAM:
CT MAXILLOFACIAL WITH CONTRAST
TECHNIQUE: Multidetector CT imaging of the maxillofacial structures was
performed with intravenous contrast. Multiplanar CT image
reconstructions were also generated. A small metallic BB was placed
on the right temple in order to reliably differentiate right from
left.
CONTRAST:  75mL K4QLZ0-8SS IOPAMIDOL (K4QLZ0-8SS) INJECTION 61%

[Series 4: max bone · axial · 0.33mm/px · z∈[-70,+40]mm · 5 of 67 slices shown, 7 images]
[im 12/67  brain]
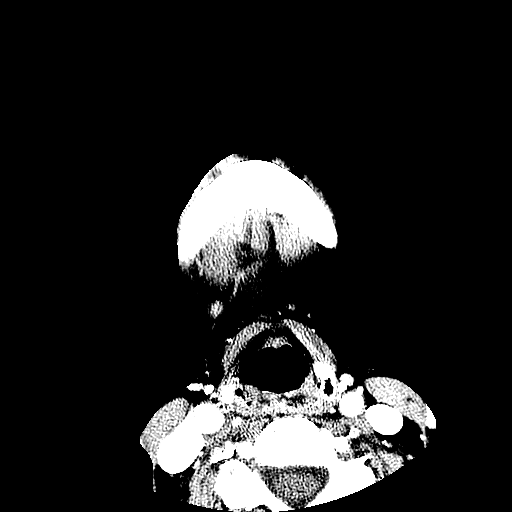
[im 12/67  bone]
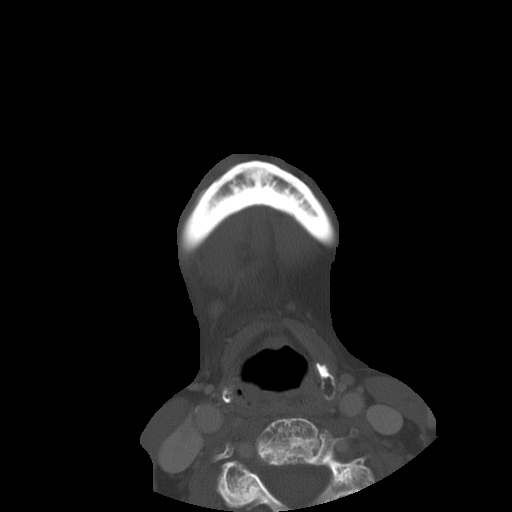
[im 23/67  bone]
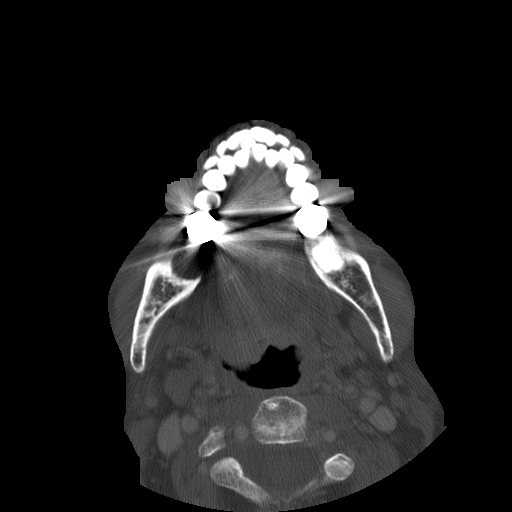
[im 34/67  bone]
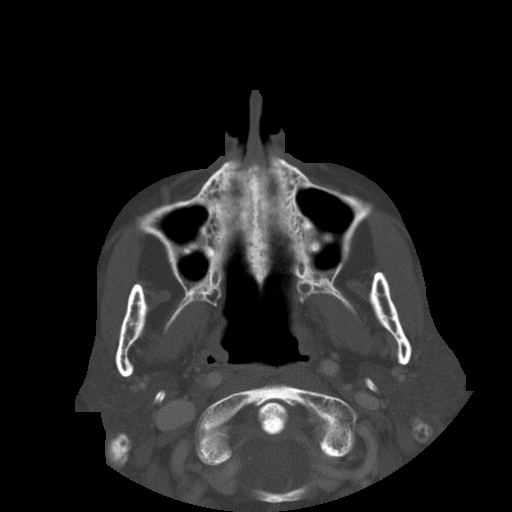
[im 45/67  bone]
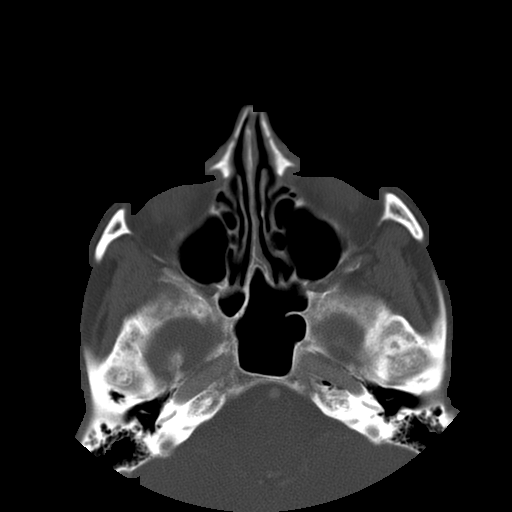
[im 56/67  brain]
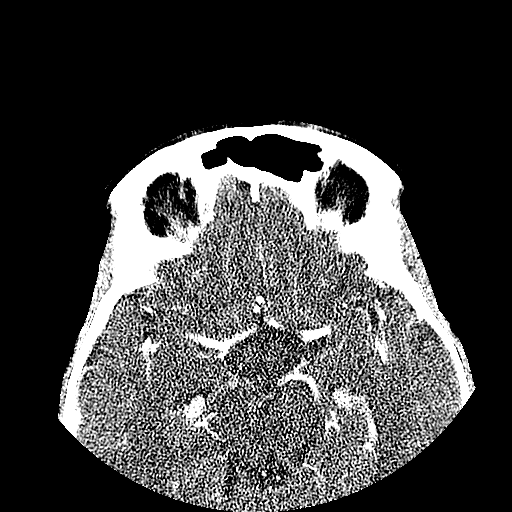
[im 56/67  bone]
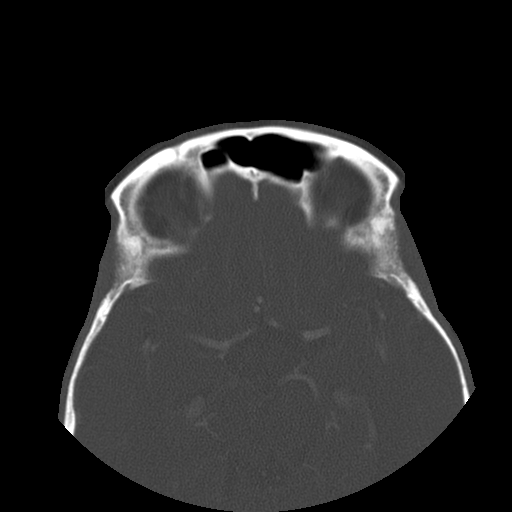

[Series 601: coronal facial · coronal · 0.33mm/px · 3 of 77 slices shown]
[im 26/77  bone]
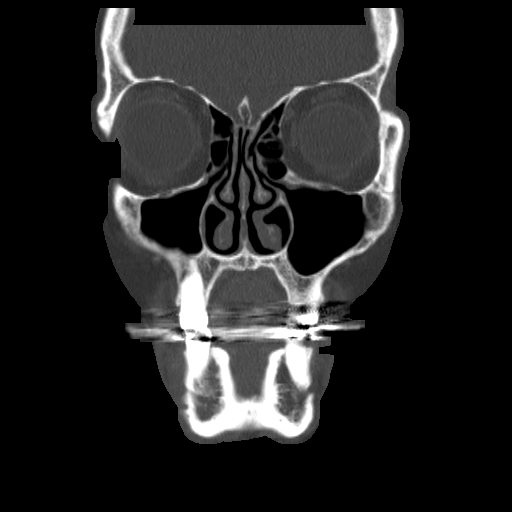
[im 39/77  bone]
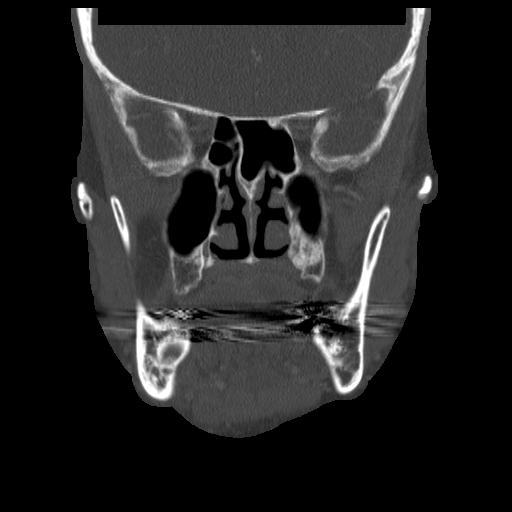
[im 51/77  bone]
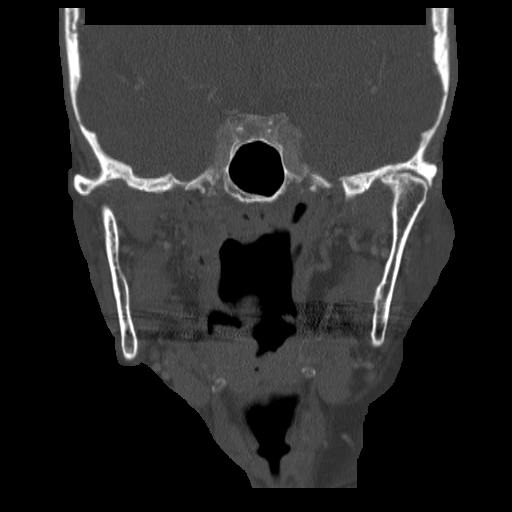

[Series 604: cor st facial · coronal · 0.33mm/px · 6 of 77 slices shown]
[im 11/77  bone]
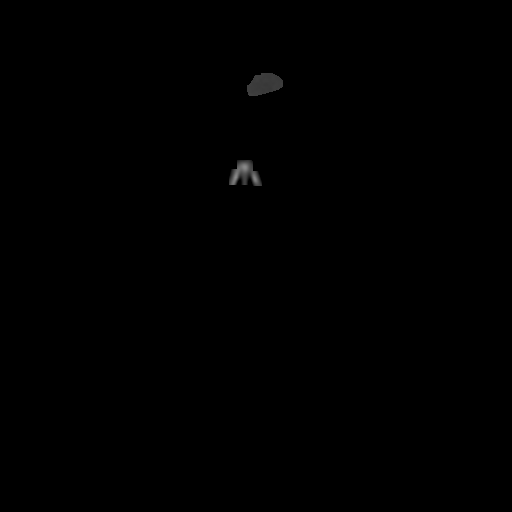
[im 22/77  bone]
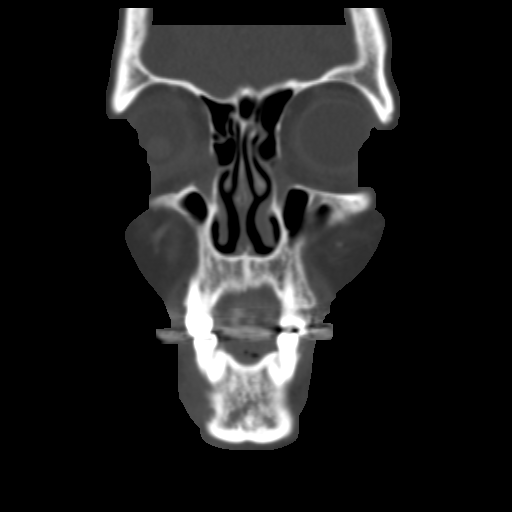
[im 33/77  bone]
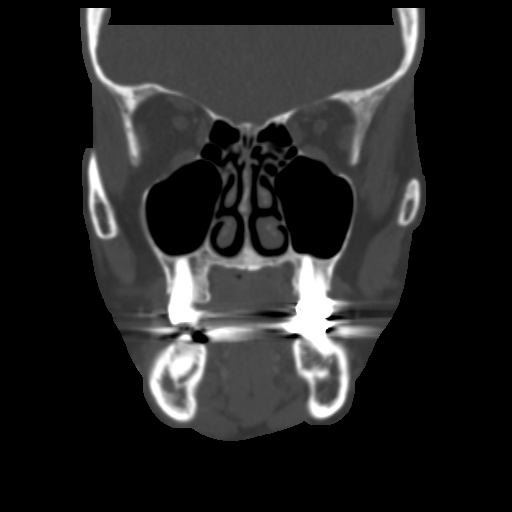
[im 44/77  bone]
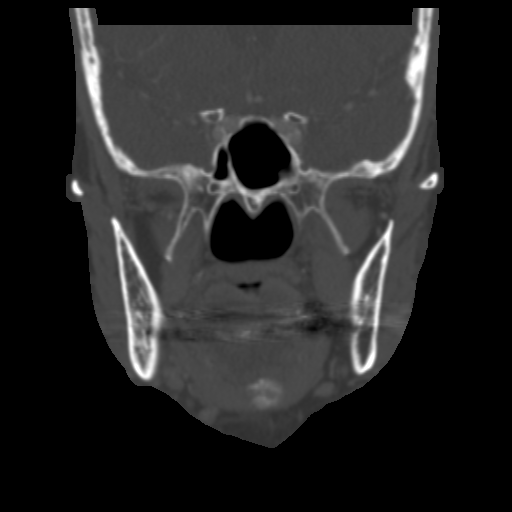
[im 55/77  bone]
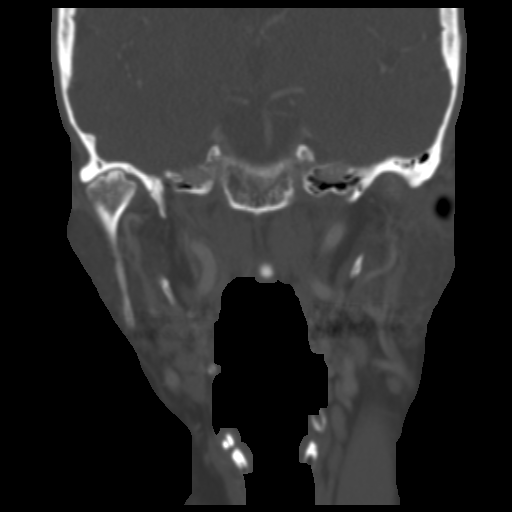
[im 66/77  bone]
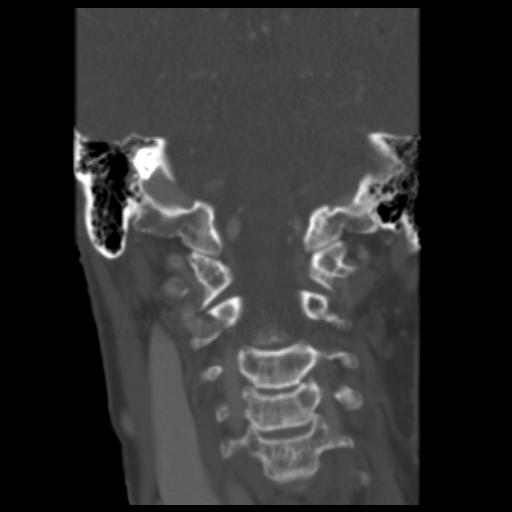

[Series 605: sag st facial · sagittal · 0.33mm/px · 3 of 75 slices shown]
[im 11/75  bone]
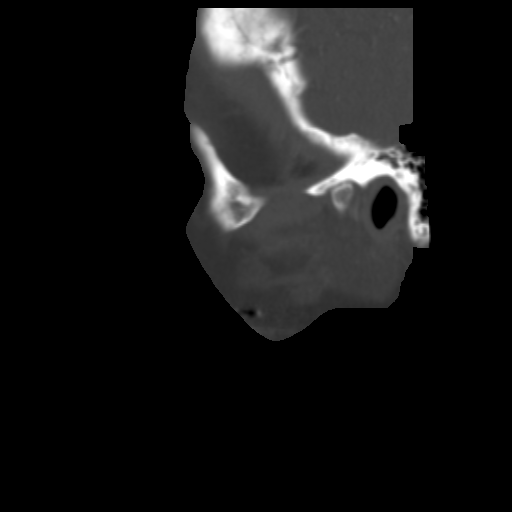
[im 22/75  bone]
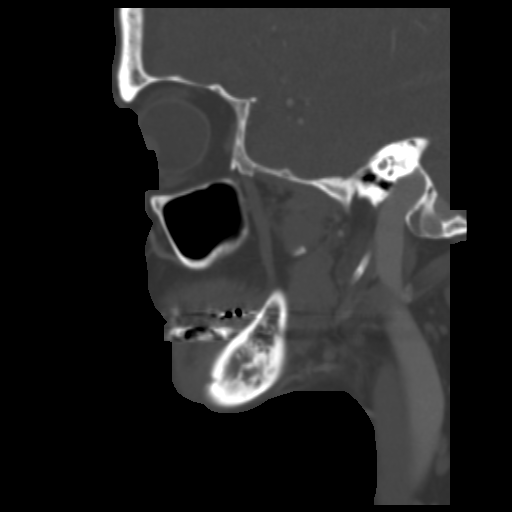
[im 32/75  bone]
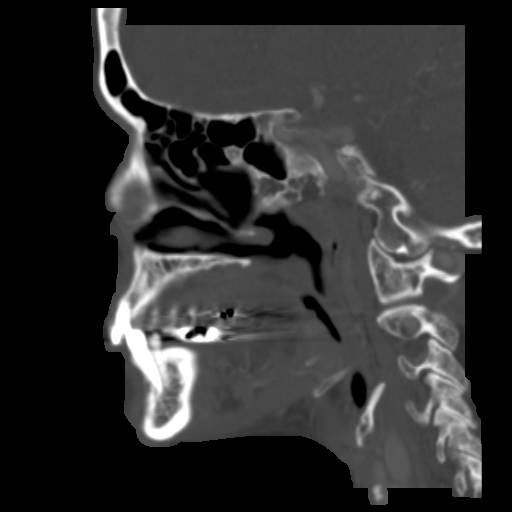

[17 of 37 positions shown; findings below may reference images not displayed]

FINDINGS: There is lucency noted in the right mandible in the region of prior
extracted right lower molar, likely periapical abscess. In addition,
there is mottled appearance within the right side of the mandible
with small areas of cortical disruption and periosteal reaction
anteriorly along the right side of the mandible. Findings are
concerning for right mandibular osteomyelitis.

No additional acute bony abnormality. Paranasal sinuses are clear.
Orbital soft tissues are unremarkable. There are mildly prominent
bilateral cervical chain lymph nodes, likely reactive. Airways
patent. No soft tissue fluid collection to suggest abscess. Mild
soft tissue swelling over the right side of the mandible.
IMPRESSION: Large periapical abscess in the right mandible. Mottled appearance
of the right mandible with areas of cortical destruction, periosteal
reaction and overlying soft tissue swelling. Findings are concerning
for possible osteomyelitis.

## 2017-07-22 DIAGNOSIS — M19042 Primary osteoarthritis, left hand: Secondary | ICD-10-CM | POA: Diagnosis not present

## 2017-07-22 DIAGNOSIS — Z79899 Other long term (current) drug therapy: Secondary | ICD-10-CM | POA: Diagnosis not present

## 2017-07-22 DIAGNOSIS — M19041 Primary osteoarthritis, right hand: Secondary | ICD-10-CM | POA: Diagnosis not present

## 2017-07-22 DIAGNOSIS — M25512 Pain in left shoulder: Secondary | ICD-10-CM | POA: Diagnosis not present

## 2017-07-22 DIAGNOSIS — M3500 Sicca syndrome, unspecified: Secondary | ICD-10-CM | POA: Diagnosis not present

## 2017-07-22 DIAGNOSIS — M0589 Other rheumatoid arthritis with rheumatoid factor of multiple sites: Secondary | ICD-10-CM | POA: Diagnosis not present

## 2017-07-22 DIAGNOSIS — M810 Age-related osteoporosis without current pathological fracture: Secondary | ICD-10-CM | POA: Diagnosis not present

## 2017-07-22 DIAGNOSIS — M150 Primary generalized (osteo)arthritis: Secondary | ICD-10-CM | POA: Diagnosis not present

## 2017-08-12 DIAGNOSIS — M0589 Other rheumatoid arthritis with rheumatoid factor of multiple sites: Secondary | ICD-10-CM | POA: Diagnosis not present

## 2017-08-23 DIAGNOSIS — J4520 Mild intermittent asthma, uncomplicated: Secondary | ICD-10-CM | POA: Diagnosis not present

## 2017-08-23 DIAGNOSIS — J45909 Unspecified asthma, uncomplicated: Secondary | ICD-10-CM | POA: Diagnosis not present

## 2017-08-23 DIAGNOSIS — E039 Hypothyroidism, unspecified: Secondary | ICD-10-CM | POA: Diagnosis not present

## 2017-08-23 DIAGNOSIS — N182 Chronic kidney disease, stage 2 (mild): Secondary | ICD-10-CM | POA: Diagnosis not present

## 2017-08-23 DIAGNOSIS — M810 Age-related osteoporosis without current pathological fracture: Secondary | ICD-10-CM | POA: Diagnosis not present

## 2017-08-23 DIAGNOSIS — F339 Major depressive disorder, recurrent, unspecified: Secondary | ICD-10-CM | POA: Diagnosis not present

## 2017-09-09 IMAGING — XA IR US GUIDE VASC ACCESS RIGHT
1 series · 1 of 1 positions shown · non-contrast
Comparison: none

CLINICAL DATA: Osteomyelitis of the mandible. Request PICC line
placement for prolonged IV antibiotic use

EXAM:
IR RIGHT FLOURO GUIDE CV LINE; IR ULTRASOUND GUIDANCE VASC ACCESS
RIGHT
FLUOROSCOPY TIME:  12 seconds
TECHNIQUE: The right arm was prepped with chlorhexidine, draped in the usual
sterile fashion using maximum barrier technique (cap and mask,
sterile gown, sterile gloves, large sterile sheet, hand hygiene and
cutaneous antiseptic). Local anesthesia was attained by infiltration
with 1% lidocaine.

[Series 300: line placements · 1 of 1 slices shown]
[im 1/1]
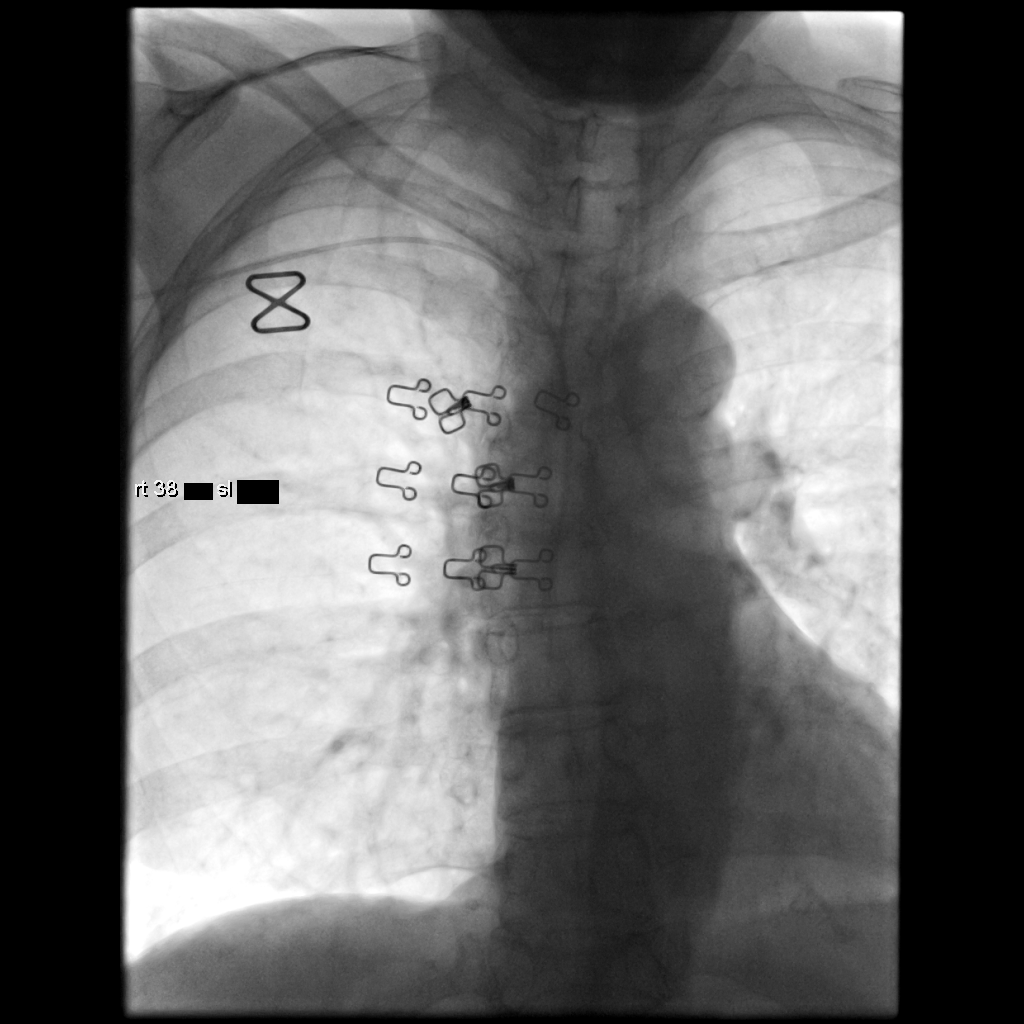

[1 of 1 positions shown; findings below may reference images not displayed]

Ultrasound demonstrated patency of the basilic vein, and this was
documented with an image. Under real-time ultrasound guidance, this
vein was accessed with a 21 gauge micropuncture needle and image
documentation was performed. The needle was exchanged over a
guidewire for a peel-away sheath through which a 38 cm 5 French
single lumen power injectable PICC was advanced, and positioned with
its tip at the lower SVC/right atrial junction. Fluoroscopy during
the procedure and fluoro spot radiograph confirms appropriate
catheter position. The catheter was flushed, secured to the skin,
and covered with a sterile dressing.

COMPLICATIONS:
None.  The patient tolerated the procedure well.
IMPRESSION: Successful placement of a right arm PICC with sonographic and
fluoroscopic guidance. The catheter is ready for use.

## 2017-10-07 DIAGNOSIS — M0589 Other rheumatoid arthritis with rheumatoid factor of multiple sites: Secondary | ICD-10-CM | POA: Diagnosis not present

## 2017-11-08 DIAGNOSIS — M3500 Sicca syndrome, unspecified: Secondary | ICD-10-CM | POA: Diagnosis not present

## 2017-11-08 DIAGNOSIS — M810 Age-related osteoporosis without current pathological fracture: Secondary | ICD-10-CM | POA: Diagnosis not present

## 2017-11-08 DIAGNOSIS — M150 Primary generalized (osteo)arthritis: Secondary | ICD-10-CM | POA: Diagnosis not present

## 2017-11-08 DIAGNOSIS — M0589 Other rheumatoid arthritis with rheumatoid factor of multiple sites: Secondary | ICD-10-CM | POA: Diagnosis not present

## 2017-11-08 DIAGNOSIS — M25512 Pain in left shoulder: Secondary | ICD-10-CM | POA: Diagnosis not present

## 2017-11-08 DIAGNOSIS — Z79899 Other long term (current) drug therapy: Secondary | ICD-10-CM | POA: Diagnosis not present

## 2017-12-01 DIAGNOSIS — M0589 Other rheumatoid arthritis with rheumatoid factor of multiple sites: Secondary | ICD-10-CM | POA: Diagnosis not present

## 2017-12-09 DIAGNOSIS — E039 Hypothyroidism, unspecified: Secondary | ICD-10-CM | POA: Diagnosis not present

## 2017-12-21 DIAGNOSIS — J4520 Mild intermittent asthma, uncomplicated: Secondary | ICD-10-CM | POA: Diagnosis not present

## 2017-12-21 DIAGNOSIS — R030 Elevated blood-pressure reading, without diagnosis of hypertension: Secondary | ICD-10-CM | POA: Diagnosis not present

## 2017-12-21 DIAGNOSIS — R05 Cough: Secondary | ICD-10-CM | POA: Diagnosis not present

## 2017-12-28 DIAGNOSIS — H33193 Other retinoschisis and retinal cysts, bilateral: Secondary | ICD-10-CM | POA: Diagnosis not present

## 2017-12-28 DIAGNOSIS — H35433 Paving stone degeneration of retina, bilateral: Secondary | ICD-10-CM | POA: Diagnosis not present

## 2017-12-28 DIAGNOSIS — H35343 Macular cyst, hole, or pseudohole, bilateral: Secondary | ICD-10-CM | POA: Diagnosis not present

## 2017-12-28 DIAGNOSIS — H35373 Puckering of macula, bilateral: Secondary | ICD-10-CM | POA: Diagnosis not present

## 2017-12-29 ENCOUNTER — Other Ambulatory Visit (INDEPENDENT_AMBULATORY_CARE_PROVIDER_SITE_OTHER): Payer: Medicare Other

## 2017-12-29 ENCOUNTER — Ambulatory Visit (INDEPENDENT_AMBULATORY_CARE_PROVIDER_SITE_OTHER): Payer: Medicare Other | Admitting: Internal Medicine

## 2017-12-29 ENCOUNTER — Encounter: Payer: Self-pay | Admitting: Internal Medicine

## 2017-12-29 VITALS — BP 112/72 | HR 102 | Ht 63.0 in | Wt 135.0 lb

## 2017-12-29 DIAGNOSIS — J45991 Cough variant asthma: Secondary | ICD-10-CM

## 2017-12-29 HISTORY — DX: Cough variant asthma: J45.991

## 2017-12-29 LAB — CBC WITH DIFFERENTIAL/PLATELET
BASOS ABS: 0.1 10*3/uL (ref 0.0–0.1)
Basophils Relative: 1.3 % (ref 0.0–3.0)
EOS PCT: 6.5 % — AB (ref 0.0–5.0)
Eosinophils Absolute: 0.6 10*3/uL (ref 0.0–0.7)
HCT: 40.6 % (ref 36.0–46.0)
Hemoglobin: 13.9 g/dL (ref 12.0–15.0)
LYMPHS ABS: 2 10*3/uL (ref 0.7–4.0)
Lymphocytes Relative: 21.9 % (ref 12.0–46.0)
MCHC: 34.1 g/dL (ref 30.0–36.0)
MCV: 90.4 fl (ref 78.0–100.0)
MONO ABS: 0.9 10*3/uL (ref 0.1–1.0)
Monocytes Relative: 10.2 % (ref 3.0–12.0)
NEUTROS PCT: 60.1 % (ref 43.0–77.0)
Neutro Abs: 5.4 10*3/uL (ref 1.4–7.7)
Platelets: 246 10*3/uL (ref 150.0–400.0)
RBC: 4.49 Mil/uL (ref 3.87–5.11)
RDW: 14.2 % (ref 11.5–15.5)
WBC: 9 10*3/uL (ref 4.0–10.5)

## 2017-12-29 LAB — NITRIC OXIDE: NITRIC OXIDE: 36

## 2017-12-29 MED ORDER — BUDESONIDE-FORMOTEROL FUMARATE 80-4.5 MCG/ACT IN AERO: 2.0000 | INHALATION_SPRAY | Freq: Two times a day (BID) | RESPIRATORY_TRACT | 11 refills | Status: DC

## 2017-12-29 MED ORDER — BUDESONIDE-FORMOTEROL FUMARATE 80-4.5 MCG/ACT IN AERO: 2.0000 | INHALATION_SPRAY | Freq: Two times a day (BID) | RESPIRATORY_TRACT | 0 refills | Status: DC

## 2017-12-29 MED ORDER — PREDNISONE 10 MG PO TABS
ORAL_TABLET | ORAL | 0 refills | Status: DC
Start: 2017-12-29 — End: 2017-12-30

## 2017-12-29 NOTE — Progress Notes (Signed)
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 Pulmonary Consult    Referred by Dr. Deland Pretty. Pt c/o cough every spring allergy season for as long as she can remember. She states cough is mainly non prod but occ will produce some clear sputum. She has some PND.  She sometimes gets SOB when doing yard work or walking her dog at a fast pace. She uses her albuterol inhaler 2 x daily on average.   cough onset end of February 2019 worse ever, mostly non prod, worse with laughing and after supper but not noct  Doe = MMRC1 = can walk nl pace, flat grade, can't hurry or go uphills or steps s sob   Says arthritis doing generally better while breathing worse   NO obvious day to day or daytime variability or assoc excess/ purulent sputum or mucus plugs or hemoptysis or cp or chest tightness, subjective wheeze or overt sinus or hb symptoms. No unusual exposure hx or h/o childhood pna/ asthma or knowledge of premature birth.  Sleeping ok flat without nocturnal  or early am exacerbation  of respiratory  c/o's or need for noct saba. Also denies any obvious fluctuation of symptoms with weather or environmental changes or other aggravating or alleviating factors except as outlined above   Current Allergies, Complete  Past Medical History, Past Surgical History, Family History, and Social History were reviewed in [employer] record.  ROS  The following are not active complaints unless bolded Hoarseness, sore throat, dysphagia, dental problems, itching, sneezing,  nasal congestion or discharge of excess mucus or purulent secretions, ear ache,   fever, chills, sweats, unintended wt loss or wt gain, classically pleuritic or exertional cp,  orthopnea pnd or leg swelling, presyncope, palpitations, abdominal pain, anorexia, nausea, vomiting, diarrhea  or change in bowel habits or change in bladder habits, change in stools or change in urine, dysuria, hematuria,  rash, arthralgias, visual complaints, headache, numbness, weakness or ataxia or problems with walking or coordination,  change in mood  or memory.        Current Meds  Medication Sig  . albuterol (VENTOLIN HFA) 108 (90 Base) MCG/ACT inhaler Inhale 2 puffs into the lungs every 6 (six) hours as needed for wheezing or shortness of breath.  . cholecalciferol (VITAMIN D) 1000 units tablet Take 1,000 Units by mouth daily.  . citalopram (CELEXA) 20 MG tablet Take 1 tablet by mouth daily.  . folic acid (FOLVITE) 301 MCG tablet Take 800 mcg by mouth daily.  Marland Kitchen ibuprofen (ADVIL,MOTRIN) 200 MG tablet Take 400 mg by mouth every 6 (six) hours as needed.   Marland Kitchen levothyroxine (SYNTHROID, LEVOTHROID) 88 MCG tablet Take 88 mcg  by mouth daily before breakfast.   . Melatonin 10 MG CAPS Take 10 mg by mouth at bedtime.   . methotrexate (RHEUMATREX) 2.5 MG tablet Take 15 mg by mouth once a week. Takes 6 tablets weekly.  . NONFORMULARY OR COMPOUNDED ITEM every 8 (eight) weeks. [facility] every other month  . RESTASIS 0.05 % ophthalmic emulsion Place 1 drop into both eyes 2 (two) times daily.          Review of Systems     Objective:   Physical Exam mp secretions/  moderate bilateral non-specific turbinate edema     amb hoarse wf nad  Wt  Readings from Last 3 Encounters:  12/29/17 135 lb (61.2 kg)  09/27/16 130 lb (59 kg)  08/27/15 122 lb (55.3 kg)     Vital signs reviewed - Note on arrival 02 sats  95% on RA   HEENT: nl dentition, turbinates bilaterally, and oropharynx. Nl external ear canals without cough reflex   NECK :  without JVD/Nodes/TM/ nl carotid upstrokes bilaterally   LUNGS: no acc muscle use,  Nl contour chest which is clear to A and P bilaterally with cough on exp maneuvers   CV:  RRR  no s3 or murmur or increase in P2, and no edema   ABD:  soft and nontender with nl inspiratory excursion in the supine position. No bruits or organomegaly appreciated, bowel sounds nl  MS:  Nl gait/ ext warm without deformities, calf tenderness, cyanosis or clubbing Classic MCP RA changes both hands    SKIN: warm and dry without lesions    NEURO:  alert, approp, nl sensorium with  no motor or cerebellar deficits apparent.       I personally reviewed images and  impression is as follows:  CXR:   12/21/17  Mild increase markings/ mild hyperinflation    Labs ordered 12/29/2017  Allergy profile           Assessment:

## 2017-12-29 NOTE — Patient Instructions (Addendum)
Prednisone 10 mg take  4 each am x 2 days,   2 each am x 2 days,  1 each am x 2 days and stop    Plan A = Automatic = symbicort 80 Take 2 puffs first thing in am and then another 2 puffs about 12 hours later.      Work on inhaler technique:  relax and gently blow all the way out then take a nice smooth deep breath back in, triggering the inhaler at same time you start breathing in.  Hold for up to 5 seconds if you can. Blow out thru nose. Rinse and gargle with water when done      Plan B = Backup Only use your albuterol as a rescue medication to be used if you can't catch your breath by resting or doing a relaxed purse lip breathing pattern.  - The less you use it, the better it will work when you need it. - Ok to use the inhaler up to 2 puffs  every 4 hours if you must but call for appointment if use goes up over your usual need - Don't leave home without it !!  (think of it like the spare tire for your car)     Please remember to go to the lab department downstairs in the basement  for your tests - we will call you with the results when they are available.      Please schedule a follow up office visit in 4 weeks, sooner if needed with full pfts on return

## 2017-12-30 ENCOUNTER — Encounter: Payer: Self-pay | Admitting: Internal Medicine

## 2017-12-30 ENCOUNTER — Other Ambulatory Visit: Payer: Self-pay | Admitting: Internal Medicine

## 2017-12-30 DIAGNOSIS — J45991 Cough variant asthma: Secondary | ICD-10-CM

## 2017-12-30 LAB — RESPIRATORY ALLERGY PROFILE REGION II ~~LOC~~
ALLERGEN, D PTERNOYSSINUS, D1: 0.94 kU/L — AB
Allergen, Cottonwood, t14: <0.1 kU/L
Allergen, Mulberry, t76: <0.1 kU/L
Aspergillus fumigatus, m3: <0.1 kU/L
Bermuda Grass: <0.1 kU/L
Box Elder IgE: <0.1 kU/L
CAT DANDER: 1.33 kU/L — AB
CLASS: 0
CLASS: 0
CLASS: 0
CLASS: 0
CLASS: 0
CLASS: 0
CLASS: 0
CLASS: 0
CLASS: 0
CLASS: 0
CLASS: 0
CLASS: 2
CLASS: 2
CLASS: 2
Class: 0
Class: 0
Class: 0
Class: 0
Class: 0
Class: 0
Class: 0
Class: 0
Class: 0
Class: 0
D. FARINAE: 1.23 kU/L — AB
Dog Dander: 0.26 kU/L — ABNORMAL HIGH
Elm IgE: <0.1 kU/L
IgE (Immunoglobulin E), Serum: 22 kU/L (ref <=114)
Johnson Grass: <0.1 kU/L
Rough Pigweed  IgE: <0.1 kU/L
Sheep Sorrel IgE: <0.1 kU/L

## 2017-12-30 LAB — INTERPRETATION:

## 2017-12-30 MED ORDER — PREDNISONE 10 MG PO TABS: ORAL_TABLET | ORAL | 0 refills | Status: DC

## 2017-12-30 NOTE — Assessment & Plan Note (Addendum)
12/29/2017  Walked RA x 3 laps @ 185 ft each stopped due to  End of study,fast pace, no sob or desat  Spirometry 12/29/2017  FEV1 1.93 (88%)  Ratio 71 p saba w/in 4 h prior - Allergy profile 12/29/2017 >  Eos 0.6 /  IgE   - FENO 12/29/2017  =   36 - 12/29/2017  After extensive coaching inhaler device  effectiveness =    75% > try symbicort 80 2bid   ddx includes RA bronchiolitis /occult ILD or even MTX toxicity but I suspect based on reported response to saba that this is just mild asthma in pt with mild allergic pattern chronic rhinitis and should do fine with just low dose laba/ics for now pending return for full pfts   Total time devoted to counseling  > 50 % of initial 60 min office visit:  review case with pt/ discussion of options/alternatives/ personally creating written customized instructions  in presence of pt  then going over those specific  Instructions directly with the pt including how to use all of the meds but in particular covering each new medication in detail and the difference between the maintenance= "automatic" meds and the prns using an action plan format for the latter (If this problem/symptom => do that organization reading Left to right).  Please see AVS from this visit for a full list of these instructions which I personally wrote for this pt and  are unique to this visit.   See device teaching which extended face to face time for this visit

## 2018-01-12 DIAGNOSIS — F339 Major depressive disorder, recurrent, unspecified: Secondary | ICD-10-CM | POA: Diagnosis not present

## 2018-01-12 DIAGNOSIS — F419 Anxiety disorder, unspecified: Secondary | ICD-10-CM | POA: Diagnosis not present

## 2018-01-21 ENCOUNTER — Institutional Professional Consult (permissible substitution): Payer: Medicare Other | Admitting: Pulmonary Disease

## 2018-01-26 DIAGNOSIS — M0589 Other rheumatoid arthritis with rheumatoid factor of multiple sites: Secondary | ICD-10-CM | POA: Diagnosis not present

## 2018-01-28 ENCOUNTER — Ambulatory Visit (INDEPENDENT_AMBULATORY_CARE_PROVIDER_SITE_OTHER): Payer: Medicare Other | Admitting: Adult Health

## 2018-01-28 ENCOUNTER — Ambulatory Visit (INDEPENDENT_AMBULATORY_CARE_PROVIDER_SITE_OTHER): Payer: Medicare Other | Admitting: Internal Medicine

## 2018-01-28 ENCOUNTER — Encounter: Payer: Self-pay | Admitting: Adult Health

## 2018-01-28 ENCOUNTER — Ambulatory Visit: Payer: Medicare Other | Admitting: Internal Medicine

## 2018-01-28 DIAGNOSIS — J45991 Cough variant asthma: Secondary | ICD-10-CM

## 2018-01-28 LAB — PULMONARY FUNCTION TEST
DL/VA % PRED: 88 %
DL/VA: 4.18 ml/min/mmHg/L
DLCO COR: 16.03 ml/min/mmHg
DLCO UNC % PRED: 68 %
DLCO UNC: 16.27 ml/min/mmHg
DLCO cor % pred: 67 %
FEF 25-75 PRE: 0.74 L/s
FEF 25-75 Post: 0.78 L/sec
FEF2575-%Change-Post: 5 %
FEF2575-%PRED-PRE: 39 %
FEF2575-%Pred-Post: 41 %
FEV1-%Change-Post: 0 %
FEV1-%PRED-POST: 68 %
FEV1-%PRED-PRE: 68 %
FEV1-POST: 1.53 L
FEV1-Pre: 1.53 L
FEV1FVC-%Change-Post: -5 %
FEV1FVC-%Pred-Pre: 86 %
FEV6-%CHANGE-POST: 4 %
FEV6-%PRED-PRE: 81 %
FEV6-%Pred-Post: 85 %
FEV6-Post: 2.4 L
FEV6-Pre: 2.3 L
FEV6FVC-%CHANGE-POST: -1 %
FEV6FVC-%Pred-Post: 101 %
FEV6FVC-%Pred-Pre: 102 %
FVC-%Change-Post: 5 %
FVC-%Pred-Post: 83 %
FVC-%Pred-Pre: 79 %
FVC-Post: 2.47 L
FVC-Pre: 2.33 L
POST FEV1/FVC RATIO: 62 %
POST FEV6/FVC RATIO: 97 %
PRE FEV1/FVC RATIO: 65 %
Pre FEV6/FVC Ratio: 99 %
RV % pred: 106 %
RV: 2.29 L
TLC % pred: 98 %
TLC: 4.89 L

## 2018-01-28 MED ORDER — FLUTICASONE FUROATE-VILANTEROL 100-25 MCG/INH IN AEPB: 1.0000 | INHALATION_SPRAY | Freq: Every day | RESPIRATORY_TRACT | 3 refills | Status: DC

## 2018-01-28 MED ORDER — FLUTICASONE FUROATE-VILANTEROL 100-25 MCG/INH IN AEPB: 1.0000 | INHALATION_SPRAY | Freq: Every day | RESPIRATORY_TRACT | 0 refills | Status: DC

## 2018-01-28 NOTE — Progress Notes (Signed)
PFT done today. 

## 2018-01-28 NOTE — Progress Notes (Signed)
@Patient  ID: [name], female    DOB: 09/17/1945, 72 y.o.   MRN: [id number]  Chief Complaint  Patient presents with  . Follow-up    Asthma     Referring provider: Deland Pretty, MD  HPI: 72 year old female never smoker seen for pulmonary consult for cough Past medical history significant for rheumatoid arthritis on methotrexate  TEST  12/29/2017  Walked RA x 3 laps @ 185 ft each stopped due to  End of study,fast pace, no sob or desat  Spirometry 12/29/2017  FEV1 1.93 (88%)  Ratio 71 p saba w/in 4 h prior - Allergy profile 12/29/2017 >  Eos 0.6 /  IgE  22 - FENO 12/29/2017  =   36    01/28/2018 Follow up : Asthma  Patient returns for a one-month follow-up.  Patient was seen last visit for a pulmonary consult for cough.  Patient was felt to have an asthma component.  Her exhaled nitric oxide testing was elevated at 36.  Eosinophils were elevated at 600 Allergy profile was positive to cat and dog and dust.  IgE was 22. Patient was given a prednisone taper and started on Symbicort.  She returns today saying that her cough has resolved.  She is unclear if Symbicort is making any difference in her breathing.  She says she was diagnosed with mild asthma years ago.  And was to use albuterol inhaler which she says she never needed.  Over the last year she has had increased symptoms with coughing.  Patient is unhappy due to the cost of the medication.  Says that her insurance will not cover Symbicort and will cover Breo but is still going to be somewhat of a increased cost burden for her.  She denies any shortness of breath or wheezing. PFT done today shows moderate airflow obstruction with an FEV1 at 68%, ratio 62, FVC 83%.  No significant bronchodilator response.  DLCO was 68%.     Allergies  Allergen Reactions  . Remicade [Infliximab] Shortness Of Breath    Immunization History  Administered Date(s) Administered  . Influenza, High Dose Seasonal PF 05/31/2017  .  Influenza-Unspecified 04/12/2014, 03/28/2015    Past Medical History:  Diagnosis Date  . Arthritis   . Complication of anesthesia   . Depression   . Hypothyroidism   . Osteomyelitis (HCC)    Mandible, right lower  . PONV (postoperative nausea and vomiting)     Tobacco History: Social History   Tobacco Use  Smoking Status Never Smoker  Smokeless Tobacco Never Used   Counseling given: Not Answered   Outpatient Medications Prior to Visit  Medication Sig Dispense Refill  . albuterol (VENTOLIN HFA) 108 (90 Base) MCG/ACT inhaler Inhale 2 puffs into the lungs every 6 (six) hours as needed for wheezing or shortness of breath.    . budesonide-formoterol (SYMBICORT) 80-4.5 MCG/ACT inhaler Inhale 2 puffs into the lungs 2 (two) times daily. 1 Inhaler 11  . cholecalciferol (VITAMIN D) 1000 units tablet Take 1,000 Units by mouth daily.    . citalopram (CELEXA) 20 MG tablet Take 1 tablet by mouth daily.  5  . folic acid (FOLVITE) 151 MCG tablet Take 800 mcg by mouth daily.    Marland Kitchen ibuprofen (ADVIL,MOTRIN) 200 MG tablet Take 400 mg by mouth every 6 (six) hours as needed.     Marland Kitchen levothyroxine (SYNTHROID, LEVOTHROID) 88 MCG tablet Take 88 mcg by mouth daily before breakfast.     . Melatonin 10 MG CAPS Take 10 mg  by mouth at bedtime.     . methotrexate (RHEUMATREX) 2.5 MG tablet Take 15 mg by mouth once a week. Takes 6 tablets weekly.  3  . NONFORMULARY OR COMPOUNDED ITEM every 8 (eight) weeks. [facility] every other month    . RESTASIS 0.05 % ophthalmic emulsion Place 1 drop into both eyes 2 (two) times daily.     . predniSONE (DELTASONE) 10 MG tablet Take  4 each am x 2 days,   2 each am x 2 days,  1 each am x 2 days and stop 14 tablet 0   No facility-administered medications prior to visit.      Review of Systems  Constitutional:   No  weight loss, night sweats,  Fevers, chills, fatigue, or  lassitude.  HEENT:   No headaches,  Difficulty swallowing,  Tooth/dental problems, or   Sore throat,                No sneezing, itching, ear ache, nasal congestion, post nasal drip,   CV:  No chest pain,  Orthopnea, PND, swelling in lower extremities, anasarca, dizziness, palpitations, syncope.   GI  No heartburn, indigestion, abdominal pain, nausea, vomiting, diarrhea, change in bowel habits, loss of appetite, bloody stools.   Resp:   No wheezing.  No chest wall deformity  Skin: no rash or lesions.  GU: no dysuria, change in color of urine, no urgency or frequency.  No flank pain, no hematuria   MS:  No joint pain or swelling.  No decreased range of motion.  No back pain.    Physical Exam  BP 118/74 (BP Location: Left Arm, Cuff Size: Normal)   Pulse 84   Ht 5' 3.5" (1.613 m)   Wt 139 lb (63 kg)   SpO2 97%   BMI 24.24 kg/m   GEN: A/Ox3; pleasant , NAD, thin elderly female    HEENT:  [place]/AT,  EACs-clear, TMs-wnl, NOSE-clear, THROAT-clear, no lesions, no postnasal drip or exudate noted.   NECK:  Supple w/ fair ROM; no JVD; normal carotid impulses w/o bruits; no thyromegaly or nodules palpated; no lymphadenopathy.    RESP  Clear  P & A; w/o, wheezes/ rales/ or rhonchi. no accessory muscle use, no dullness to percussion  CARD:  RRR, no m/r/g, no peripheral edema, pulses intact, no cyanosis or clubbing.  GI:   Soft & nt; nml bowel sounds; no organomegaly or masses detected.   Musco: Warm bil, no deformities or joint swelling noted.   Neuro: alert, no focal deficits noted.    Skin: Warm, no lesions or rashes    Lab Results:  CBC    Component Value Date/Time   WBC 9.0 12/29/2017 0940   RBC 4.49 12/29/2017 0940   HGB 13.9 12/29/2017 0940   HCT 40.6 12/29/2017 0940   PLT 246.0 12/29/2017 0940   MCV 90.4 12/29/2017 0940   MCH 29.0 09/27/2016 1756   MCHC 34.1 12/29/2017 0940   RDW 14.2 12/29/2017 0940   LYMPHSABS 2.0 12/29/2017 0940   MONOABS 0.9 12/29/2017 0940   EOSABS 0.6 12/29/2017 0940   BASOSABS 0.1 12/29/2017 0940    BMET    Component  Value Date/Time   NA 142 09/27/2016 1756   K 4.5 09/27/2016 1756   CL 109 09/27/2016 1756   CO2 26 09/27/2016 1756   GLUCOSE 91 09/27/2016 1756   BUN 19 09/27/2016 1756   CREATININE 0.59 09/27/2016 1756   CREATININE 0.74 04/01/2015 1036   CALCIUM 9.2 09/27/2016 1756  GFRNONAA >60 09/27/2016 1756   GFRAA >60 09/27/2016 1756    BNP No results found for: BNP  ProBNP No results found for: PROBNP  Imaging: No results found.   Assessment & Plan:   Cough variant asthma Cough resolved after steroid and initiation of Symbicort  Moderate airflow obstruction on PFT  Would cont on ICS/LABA combo  Patient does have a diffusing defect on her PFT , if symptoms return or she becomes more symptomatic could consider a high-resolution CT chest to rule out interstitial lung disease that she has underlying rheumatoid arthritis and is on methotrexate.  For now patient is not very symptomatic will hold on this testing at this time Change to [facility] per insurance recs  Plan  Patient Instructions  Finish Symbicort inhaler , then may begin BREO 1 puff daily , rinse after use.  Ventolin Inhaler 2 puffs every 4hrs as needed. Wheezing -this is your rescue inhaler  Follow up with Dr. Melvyn Novas  In 3 months and As needed   Please contact office for sooner follow up if symptoms do not improve or worsen or seek emergency care           Rexene Edison, NP 01/28/2018

## 2018-01-28 NOTE — Patient Instructions (Addendum)
Finish Symbicort inhaler , then may begin BREO 1 puff daily , rinse after use.  Ventolin Inhaler 2 puffs every 4hrs as needed. Wheezing -this is your rescue inhaler  Follow up with Dr. Melvyn Novas  In 3 months and As needed   Please contact office for sooner follow up if symptoms do not improve or worsen or seek emergency care

## 2018-01-28 NOTE — Assessment & Plan Note (Addendum)
Cough resolved after steroid and initiation of Symbicort  Moderate airflow obstruction on PFT  Would cont on ICS/LABA combo  Patient does have a diffusing defect on her PFT , if symptoms return or she becomes more symptomatic could consider a high-resolution CT chest to rule out interstitial lung disease that she has underlying rheumatoid arthritis and is on methotrexate.  For now patient is not very symptomatic will hold on this testing at this time Change to [facility] per insurance recs  Plan  Patient Instructions  Finish Symbicort inhaler , then may begin BREO 1 puff daily , rinse after use.  Ventolin Inhaler 2 puffs every 4hrs as needed. Wheezing -this is your rescue inhaler  Follow up with Dr. Melvyn Novas  In 3 months and As needed   Please contact office for sooner follow up if symptoms do not improve or worsen or seek emergency care

## 2018-01-28 NOTE — Progress Notes (Signed)
Patient seen in the office today and instructed on use of Breo 100.  Patient expressed understanding and demonstrated technique. Parke Poisson, [facility] 01/28/18

## 2018-01-29 NOTE — Progress Notes (Signed)
Chart and office note reviewed in detail  > agree with a/p as outlined    

## 2018-02-07 NOTE — Progress Notes (Signed)
Spoke with pt and notified of results per Dr. Wert. Pt verbalized understanding and denied any questions. 

## 2018-02-10 DIAGNOSIS — M0589 Other rheumatoid arthritis with rheumatoid factor of multiple sites: Secondary | ICD-10-CM | POA: Diagnosis not present

## 2018-02-10 DIAGNOSIS — M3500 Sicca syndrome, unspecified: Secondary | ICD-10-CM | POA: Diagnosis not present

## 2018-02-10 DIAGNOSIS — M150 Primary generalized (osteo)arthritis: Secondary | ICD-10-CM | POA: Diagnosis not present

## 2018-02-10 DIAGNOSIS — M810 Age-related osteoporosis without current pathological fracture: Secondary | ICD-10-CM | POA: Diagnosis not present

## 2018-02-10 DIAGNOSIS — Z79899 Other long term (current) drug therapy: Secondary | ICD-10-CM | POA: Diagnosis not present

## 2018-02-16 DIAGNOSIS — G4700 Insomnia, unspecified: Secondary | ICD-10-CM | POA: Diagnosis not present

## 2018-02-16 DIAGNOSIS — F339 Major depressive disorder, recurrent, unspecified: Secondary | ICD-10-CM | POA: Diagnosis not present

## 2018-03-23 DIAGNOSIS — M0589 Other rheumatoid arthritis with rheumatoid factor of multiple sites: Secondary | ICD-10-CM | POA: Diagnosis not present

## 2018-05-02 ENCOUNTER — Ambulatory Visit (INDEPENDENT_AMBULATORY_CARE_PROVIDER_SITE_OTHER): Payer: Medicare Other | Admitting: Internal Medicine

## 2018-05-02 ENCOUNTER — Encounter: Payer: Self-pay | Admitting: Internal Medicine

## 2018-05-02 VITALS — BP 122/88 | HR 82 | Ht 62.0 in | Wt 140.6 lb

## 2018-05-02 DIAGNOSIS — J45991 Cough variant asthma: Secondary | ICD-10-CM | POA: Diagnosis not present

## 2018-05-02 DIAGNOSIS — M059 Rheumatoid arthritis with rheumatoid factor, unspecified: Secondary | ICD-10-CM | POA: Diagnosis not present

## 2018-05-02 NOTE — Progress Notes (Signed)
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 and around the same time noted spring time "allergies" = nasal congestion / itchy eyes which overall has worsened over the years and more year round then new assoc cough started around 2000 but just put it up with it s specialty eval and started on advair in the early 2000s by Dr Sandi Mariscal did not help and started using albuterol which seemed to help some and taking more regularly since early June   2019 and referred to pulmonary clinic 12/29/2017 by Dr   Pharr/ T[facility] Mould      History of Present Illness  12/29/2017 1st [facility] office visit/ [name]   Chief Complaint  Patient presents with  . Pulmonary Consult    Referred by Dr. Deland Pretty. Pt c/o cough every spring allergy season for as long as she can remember. She states cough is mainly non prod but occ will produce some clear sputum. She has some PND.  She sometimes gets SOB when doing yard work or walking her dog at a fast pace. She uses her albuterol inhaler 2 x daily on average.   cough onset end of February 2019 worse ever since , mostly non prod, worse with laughing and after supper but not noct  Doe = MMRC1 = can walk nl pace, flat grade, can't hurry or go uphills or steps s sob   Says arthritis doing generally better while breathing worse  rec Prednisone 10 mg take  4 each am x 2 days,   2 each am x 2 days,  1 each am x 2 days and stop  Plan A = Automatic = symbicort 80 Take 2 puffs first thing in am and then another 2 puffs about 12 hours later.  Work on inhaler technique:  relax and gently blow all the way out then take a nice smooth deep breath back in, triggering the inhaler at same time you start breathing in.   Plan B = Backup Only use your albuterol as a rescue medication to be used if you can't catch your breath    NP 02/07/18   rx Symb > Breo due to insurance    05/02/2018  f/u ov/[name] re:  Cough variant asthma /  ? uacs ? RA lung dz  Chief Complaint  Patient presents with  . Follow-up    States her breathing has been at her basline, cough has gotten better.   Dyspnea:  MMRC1 = can walk nl pace, flat grade, can't hurry or go uphills or steps s sob   Cough:  recurrent daytime  on Breo vs was completely gone while on symbicort/ cough with laughing  Sleeping: able to lie one pillow  SABA use: never  02: no    No obvious day to day or daytime variability or assoc excess/ purulent sputum or mucus plugs or hemoptysis or cp or chest tightness, subjective wheeze or overt sinus or hb symptoms.   Sleeping fine as above  without nocturnal  or early am exacerbation  of respiratory  c/o's or need for noct saba. Also denies any obvious fluctuation of symptoms with weather or environmental changes or other aggravating or alleviating factors except as outlined above   No unusual exposure hx or h/o childhood pna/ asthma or knowledge of premature birth.  Current Allergies, Complete Past Medical History, Past Surgical History, Family History, and Social History were reviewed in [employer] record.  ROS  The following are not active complaints unless bolded Hoarseness, sore throat, dysphagia, dental problems, itching, sneezing,  nasal congestion or discharge of excess mucus or purulent secretions, ear ache,   fever, chills, sweats, unintended wt loss or wt gain, classically pleuritic or exertional cp,  orthopnea pnd or arm/hand swelling  or leg swelling, presyncope, palpitations, abdominal pain, anorexia, nausea, vomiting, diarrhea  or change in bowel habits or change in bladder habits, change in stools or change in urine, dysuria, hematuria,  rash, arthralgias, visual complaints, headache, numbness, weakness or ataxia or problems with walking or coordination,  change in mood or  memory.        Current Meds   Medication Sig  . albuterol (VENTOLIN HFA) 108 (90 Base) MCG/ACT inhaler Inhale 2 puffs into the lungs every 6 (six) hours as needed for wheezing or shortness of breath.  . cholecalciferol (VITAMIN D) 1000 units tablet Take 1,000 Units by mouth daily.  . citalopram (CELEXA) 20 MG tablet Take 1 tablet by mouth daily.  . folic acid (FOLVITE) 159 MCG tablet Take 800 mcg by mouth daily.  Marland Kitchen ibuprofen (ADVIL,MOTRIN) 200 MG tablet Take 400 mg by mouth every 6 (six) hours as needed.   Marland Kitchen levothyroxine (SYNTHROID, LEVOTHROID) 75 MCG tablet Take 75 mcg by mouth daily before breakfast.  . Melatonin 10 MG CAPS Take 10 mg by mouth at bedtime.   . methotrexate (RHEUMATREX) 2.5 MG tablet Take 15 mg by mouth once a week. Takes 6 tablets weekly.  . NONFORMULARY OR COMPOUNDED ITEM every 8 (eight) weeks. [facility] every other month  . RESTASIS 0.05 % ophthalmic emulsion Place 1 drop into both eyes 2 (two) times daily.                 Objective:   Physical Exam   amb wf squeaky voice   05/02/2018      140   12/29/17 135 lb (61.2 kg)  09/27/16 130 lb (59 kg)  08/27/15 122 lb (55.3 kg)     Vital signs reviewed - Note on arrival 02 sats  96% on RA      HEENT: nl dentition, turbinates bilaterally, and oropharynx. Nl external ear canals without cough reflex   NECK :  without JVD/Nodes/TM/ nl carotid upstrokes bilaterally   LUNGS: no acc muscle use,  Nl contour chest which is clear to A and P bilaterally without cough on insp or exp maneuvers   CV:  RRR  no s3 or murmur or increase in P2, and no edema   ABD:  soft and nontender with nl inspiratory excursion in the supine position. No bruits or organomegaly appreciated, bowel sounds nl  MS:  Nl gait/ ext warm without deformities, calf tenderness, cyanosis or clubbing No obvious joint restrictions   SKIN: warm and dry without lesions    NEURO:  alert, approp, nl sensorium with  no motor or cerebellar deficits apparent.                        Assessment:

## 2018-05-02 NOTE — Patient Instructions (Signed)
Symbicort 80 = dulera 100   Next office visit is July 2020 with PFTs  - call sooner if needed

## 2018-05-06 ENCOUNTER — Encounter: Payer: Self-pay | Admitting: Internal Medicine

## 2018-05-06 NOTE — Assessment & Plan Note (Addendum)
-   PFT's  01/28/18  FEV1 1.53 (68 % ) ratio 62  p 0 % improvement from saba p ?  prior to study with DLCO 68/67c% corrects to 88 % for alv volume  ? C/w RA bronchiolitis?    Key here is to control the underlying RA systemically>  Continue rx per rheum    I had an extended discussion with the patient reviewing all relevant studies completed to date and  lasting 15 to 20 minutes of a 25 minute visit    See device teaching which extended face to face time for this visit.  Each maintenance medication was reviewed in detail including emphasizing most importantly the difference between maintenance and prns and under what circumstances the prns are to be triggered using an action plan format that is not reflected in the computer generated alphabetically organized AVS which I have not found useful in most complex patients, especially with respiratory illnesses  Please see AVS for specific instructions unique to this visit that I personally wrote and verbalized to the the pt in detail and then reviewed with pt  by my nurse highlighting any  changes in therapy recommended at today's visit to their plan of care.

## 2018-05-18 DIAGNOSIS — M0589 Other rheumatoid arthritis with rheumatoid factor of multiple sites: Secondary | ICD-10-CM | POA: Diagnosis not present

## 2018-06-13 DIAGNOSIS — E559 Vitamin D deficiency, unspecified: Secondary | ICD-10-CM | POA: Diagnosis not present

## 2018-06-13 DIAGNOSIS — E039 Hypothyroidism, unspecified: Secondary | ICD-10-CM | POA: Diagnosis not present

## 2018-06-16 DIAGNOSIS — M069 Rheumatoid arthritis, unspecified: Secondary | ICD-10-CM | POA: Diagnosis not present

## 2018-06-16 DIAGNOSIS — Z0000 Encounter for general adult medical examination without abnormal findings: Secondary | ICD-10-CM | POA: Diagnosis not present

## 2018-06-16 DIAGNOSIS — F329 Major depressive disorder, single episode, unspecified: Secondary | ICD-10-CM | POA: Diagnosis not present

## 2018-06-16 DIAGNOSIS — F419 Anxiety disorder, unspecified: Secondary | ICD-10-CM | POA: Diagnosis not present

## 2018-06-16 DIAGNOSIS — Z23 Encounter for immunization: Secondary | ICD-10-CM | POA: Diagnosis not present

## 2018-06-16 DIAGNOSIS — E039 Hypothyroidism, unspecified: Secondary | ICD-10-CM | POA: Diagnosis not present

## 2018-06-20 DIAGNOSIS — M810 Age-related osteoporosis without current pathological fracture: Secondary | ICD-10-CM | POA: Diagnosis not present

## 2018-06-20 DIAGNOSIS — M0589 Other rheumatoid arthritis with rheumatoid factor of multiple sites: Secondary | ICD-10-CM | POA: Diagnosis not present

## 2018-06-20 DIAGNOSIS — I10 Essential (primary) hypertension: Secondary | ICD-10-CM | POA: Diagnosis not present

## 2018-06-20 DIAGNOSIS — N182 Chronic kidney disease, stage 2 (mild): Secondary | ICD-10-CM | POA: Diagnosis not present

## 2018-06-20 DIAGNOSIS — Z79899 Other long term (current) drug therapy: Secondary | ICD-10-CM | POA: Diagnosis not present

## 2018-06-20 DIAGNOSIS — M4125 Other idiopathic scoliosis, thoracolumbar region: Secondary | ICD-10-CM | POA: Diagnosis not present

## 2018-06-20 DIAGNOSIS — M150 Primary generalized (osteo)arthritis: Secondary | ICD-10-CM | POA: Diagnosis not present

## 2018-06-20 DIAGNOSIS — M3500 Sicca syndrome, unspecified: Secondary | ICD-10-CM | POA: Diagnosis not present

## 2018-06-25 ENCOUNTER — Other Ambulatory Visit: Payer: Self-pay | Admitting: Adult Health

## 2018-07-14 DIAGNOSIS — M0589 Other rheumatoid arthritis with rheumatoid factor of multiple sites: Secondary | ICD-10-CM | POA: Diagnosis not present

## 2018-08-31 DIAGNOSIS — R112 Nausea with vomiting, unspecified: Secondary | ICD-10-CM | POA: Diagnosis not present

## 2018-08-31 DIAGNOSIS — E860 Dehydration: Secondary | ICD-10-CM | POA: Diagnosis not present

## 2018-08-31 DIAGNOSIS — N390 Urinary tract infection, site not specified: Secondary | ICD-10-CM | POA: Diagnosis not present

## 2018-09-15 DIAGNOSIS — M0589 Other rheumatoid arthritis with rheumatoid factor of multiple sites: Secondary | ICD-10-CM | POA: Diagnosis not present

## 2018-11-10 DIAGNOSIS — M0589 Other rheumatoid arthritis with rheumatoid factor of multiple sites: Secondary | ICD-10-CM | POA: Diagnosis not present

## 2018-12-21 DIAGNOSIS — I10 Essential (primary) hypertension: Secondary | ICD-10-CM | POA: Diagnosis not present

## 2018-12-21 DIAGNOSIS — Z79899 Other long term (current) drug therapy: Secondary | ICD-10-CM | POA: Diagnosis not present

## 2018-12-21 DIAGNOSIS — M0589 Other rheumatoid arthritis with rheumatoid factor of multiple sites: Secondary | ICD-10-CM | POA: Diagnosis not present

## 2018-12-21 DIAGNOSIS — M150 Primary generalized (osteo)arthritis: Secondary | ICD-10-CM | POA: Diagnosis not present

## 2018-12-21 DIAGNOSIS — M810 Age-related osteoporosis without current pathological fracture: Secondary | ICD-10-CM | POA: Diagnosis not present

## 2018-12-21 DIAGNOSIS — M4125 Other idiopathic scoliosis, thoracolumbar region: Secondary | ICD-10-CM | POA: Diagnosis not present

## 2018-12-21 DIAGNOSIS — M3500 Sicca syndrome, unspecified: Secondary | ICD-10-CM | POA: Diagnosis not present

## 2018-12-21 DIAGNOSIS — N182 Chronic kidney disease, stage 2 (mild): Secondary | ICD-10-CM | POA: Diagnosis not present

## 2019-01-05 DIAGNOSIS — M0589 Other rheumatoid arthritis with rheumatoid factor of multiple sites: Secondary | ICD-10-CM | POA: Diagnosis not present

## 2019-01-31 ENCOUNTER — Ambulatory Visit: Payer: Medicare Other | Admitting: Internal Medicine

## 2019-02-08 ENCOUNTER — Other Ambulatory Visit: Payer: Self-pay | Admitting: Internal Medicine

## 2019-02-17 ENCOUNTER — Other Ambulatory Visit: Payer: Self-pay | Admitting: *Deleted

## 2019-02-17 ENCOUNTER — Other Ambulatory Visit (HOSPITAL_COMMUNITY): Admission: RE | Admit: 2019-02-17 | Payer: Medicare Other | Source: Ambulatory Visit

## 2019-02-17 DIAGNOSIS — R6889 Other general symptoms and signs: Secondary | ICD-10-CM | POA: Diagnosis not present

## 2019-02-17 DIAGNOSIS — Z20822 Contact with and (suspected) exposure to covid-19: Secondary | ICD-10-CM

## 2019-02-18 LAB — NOVEL CORONAVIRUS, NAA: SARS-CoV-2, NAA: NOT DETECTED

## 2019-02-21 ENCOUNTER — Other Ambulatory Visit: Payer: Self-pay

## 2019-02-21 ENCOUNTER — Ambulatory Visit (INDEPENDENT_AMBULATORY_CARE_PROVIDER_SITE_OTHER): Payer: Medicare Other | Admitting: Internal Medicine

## 2019-02-21 ENCOUNTER — Encounter: Payer: Self-pay | Admitting: Internal Medicine

## 2019-02-21 ENCOUNTER — Telehealth: Payer: Self-pay | Admitting: Internal Medicine

## 2019-02-21 DIAGNOSIS — M069 Rheumatoid arthritis, unspecified: Secondary | ICD-10-CM | POA: Diagnosis not present

## 2019-02-21 DIAGNOSIS — J45991 Cough variant asthma: Secondary | ICD-10-CM | POA: Diagnosis not present

## 2019-02-21 LAB — PULMONARY FUNCTION TEST
DL/VA % pred: 86 %
DL/VA: 3.59 ml/min/mmHg/L
DLCO unc % pred: 78 %
DLCO unc: 14.8 ml/min/mmHg
FEF 25-75 Post: 0.84 L/sec
FEF 25-75 Pre: 0.65 L/sec
FEF2575-%Change-Post: 29 %
FEF2575-%Pred-Post: 45 %
FEF2575-%Pred-Pre: 35 %
FEV1-%Change-Post: 11 %
FEV1-%Pred-Post: 74 %
FEV1-%Pred-Pre: 66 %
FEV1-Post: 1.61 L
FEV1-Pre: 1.45 L
FEV1FVC-%Change-Post: 8 %
FEV1FVC-%Pred-Pre: 83 %
FEV6-%Change-Post: 3 %
FEV6-%Pred-Post: 84 %
FEV6-%Pred-Pre: 81 %
FEV6-Post: 2.31 L
FEV6-Pre: 2.23 L
FEV6FVC-%Change-Post: 0 %
FEV6FVC-%Pred-Post: 103 %
FEV6FVC-%Pred-Pre: 102 %
FVC-%Change-Post: 2 %
FVC-%Pred-Post: 81 %
FVC-%Pred-Pre: 79 %
FVC-Post: 2.34 L
FVC-Pre: 2.28 L
Post FEV1/FVC ratio: 69 %
Post FEV6/FVC ratio: 99 %
Pre FEV1/FVC ratio: 63 %
Pre FEV6/FVC Ratio: 98 %
RV % pred: 124 %
RV: 2.67 L
TLC % pred: 101 %
TLC: 4.99 L

## 2019-02-21 MED ORDER — MOMETASONE FURO-FORMOTEROL FUM 100-5 MCG/ACT IN AERO: 2.0000 | INHALATION_SPRAY | Freq: Two times a day (BID) | RESPIRATORY_TRACT | 0 refills | Status: DC

## 2019-02-21 MED ORDER — MOMETASONE FURO-FORMOTEROL FUM 100-5 MCG/ACT IN AERO: INHALATION_SPRAY | RESPIRATORY_TRACT | 11 refills | Status: DC

## 2019-02-21 NOTE — Telephone Encounter (Signed)
Called and spoke with pt. Pt said her insurance had clinical questions that were needed in regards to the [place]. I stated to pt that it seemed like the med might require a PA. Asked her if she had picked up the med from the pharmacy yet and she said that she had not. Stated to pt to contact pharmacy to see if she is able to pick up the Rx or if they tell her that a PA or something else is needed prior to her to be able to pick up the Rx and pt said that she would call pharmacy and then call our office back tomorrow. Will keep encounter open.

## 2019-02-21 NOTE — Progress Notes (Signed)
PFT completed today 02/21/19.

## 2019-02-21 NOTE — Progress Notes (Signed)
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 and around the same time noted spring time "allergies" = nasal congestion / itchy eyes which overall has worsened over the years and more year round then new assoc cough started around 2000 but just put it up with it s specialty eval and started on advair in the early 2000s by Dr Sandi Mariscal did not help and started using albuterol which seemed to help some and taking more regularly since early June   2019 and referred to pulmonary clinic 12/29/2017 by Dr Pharr/ Titus Mould      History of Present Illness  12/29/2017 1st [facility] office visit/ [name]   Chief Complaint  Patient presents with  . Pulmonary Consult    Referred by Dr. Deland Pretty. Pt c/o cough every spring allergy season for as long as she can remember. She states cough is mainly non prod but occ will produce some clear sputum. She has some PND.  She sometimes gets SOB when doing yard work or walking her dog at a fast pace. She uses her albuterol inhaler 2 x daily on average.   cough onset end of February 2019 worse ever since , mostly non prod, worse with laughing and after supper but not noct  Doe = MMRC1 = can walk nl pace, flat grade, can't hurry or go uphills or steps s sob   Says arthritis doing generally better while breathing worse  rec Prednisone 10 mg take  4 each am x 2 days,   2 each am x 2 days,  1 each am x 2 days and stop  Plan A = Automatic = symbicort 80 Take 2 puffs first thing in am and then another 2 puffs about 12 hours later.  Work on inhaler technique:  relax and gently blow all the way out then take a nice smooth deep breath back in, triggering the inhaler at same time you start breathing in.   Plan B = Backup Only use your albuterol as a rescue medication to be used if you can't catch your breath    NP  02/07/18  rx Symb > Breo due to insurance    05/02/2018  f/u ov/[name] re:  Cough variant asthma /  ? uacs ? RA lung dz  Chief Complaint  Patient presents with  . Follow-up    States her breathing has been at her basline, cough has gotten better.   Dyspnea:  MMRC1 = can walk nl pace, flat grade, can't hurry or go uphills or steps s sob   Cough:  recurrent daytime  on Breo vs was completely gone while on symbicort/ cough with laughing  Sleeping: able to lie one pillow  SABA use: never  rec symb 80 or dulera 100 2 bid  > changed to BREO 100 by insurance but even it has high deductible    02/21/2019  f/u ov/[name] re: RA / chronic asthma   - off mtx  Chief Complaint  Patient presents with  . Follow-up    Pt states she is now using Breo but doesnt use it consistently due to monthly cost. Pt states she maybe uses it once or twice a week but does notice an improvement in her breathing when she does use it. Pt states she rarely uses her albuterol.  Pt c/o prod cough with white mucus but states this is baseline for her. Pt denies CP/tightness and f/c/s.   Dyspnea:  Better p Breo Cough: hoarse / cough day > noct since using breo 100 regularly > mini white mucus production Sleeping: on side one pillow ok  SABA use: rarely 02: none    No obvious day to day or daytime variability or assoc  purulent sputum or mucus plugs or hemoptysis or cp or chest tightness, subjective wheeze or overt sinus or hb symptoms.   Sleeping as above  without nocturnal  or early am exacerbation  of respiratory  c/o's or need for noct saba. Also denies any obvious fluctuation of symptoms with weather or environmental changes or other aggravating or alleviating factors except as outlined above   No unusual exposure hx or h/o childhood pna/ asthma or knowledge of premature birth.  Current Allergies, Complete Past Medical History, Past Surgical History, Family History, and Social History were reviewed in [facility] record.  ROS  The following are not active complaints unless bolded Hoarseness, sore throat, dysphagia, dental problems, itching, sneezing,  nasal congestion or discharge of excess mucus or purulent secretions, ear ache,   fever, chills, sweats, unintended wt loss or wt gain, classically pleuritic or exertional cp,  orthopnea pnd or arm/hand swelling  or leg swelling, presyncope, palpitations, abdominal pain, anorexia, nausea, vomiting, diarrhea  or change in bowel habits or change in bladder habits, change in stools or change in urine, dysuria, hematuria,  rash, arthralgias better , visual complaints, headache, numbness, weakness or ataxia or problems with walking or coordination,  change in mood or  memory.        Current Meds  Medication Sig  . albuterol (VENTOLIN HFA) 108 (90 Base) MCG/ACT inhaler Inhale 2 puffs into the lungs every 6 (six) hours as needed for wheezing or shortness of breath.  . ALPRAZolam (XANAX) 0.5 MG tablet Take 0.5 mg by mouth at bedtime as needed for anxiety.  Marland Kitchen BREO ELLIPTA 100-25 MCG/INH AEPB INHALE 1 PUFF INTO THE LUNGS DAILY  . cholecalciferol (VITAMIN D) 1000 units tablet Take 2,000 Units by mouth daily.   . citalopram (CELEXA) 20 MG tablet Take 1 tablet by mouth daily.  Marland Kitchen ibuprofen (ADVIL,MOTRIN) 200 MG tablet Take 400 mg by mouth every 6 (six) hours as needed.   Marland Kitchen levothyroxine (SYNTHROID, LEVOTHROID) 75 MCG tablet Take 75 mcg by mouth daily before breakfast.  . NONFORMULARY OR COMPOUNDED ITEM every 8 (eight) weeks. [facility] every other month               Objective:   Physical Exam   amb wf nad   02/21/2019        144 05/02/2018      140   12/29/17 135 lb (61.2 kg)  09/27/16 130 lb (59 kg)  08/27/15 122 lb (55.3 kg)      Vital signs reviewed - Note on arrival 02 sats  93% on RA    HEENT: nl dentition, turbinates bilaterally, and oropharynx. Nl external ear canals without cough reflex   NECK :  without  JVD/Nodes/TM/ nl carotid upstrokes bilaterally   LUNGS: no acc muscle use,  Nl contour chest which is clear to A and P bilaterally without cough on insp or exp maneuvers   CV:  RRR  no s3 or murmur or increase in P2, and no edema   ABD:  soft and nontender with nl inspiratory excursion in the supine  position. No bruits or organomegaly appreciated, bowel sounds nl  MS:  Nl gait/ ext warm with typical severe RA  Deformities MCP's bilaterally with ulnar deviation , calf tenderness, cyanosis or clubbing No obvious joint restrictions   SKIN: warm and dry without lesions    NEURO:  alert, approp, nl sensorium with  no motor or cerebellar deficits apparent.                           Assessment:

## 2019-02-21 NOTE — Patient Instructions (Addendum)
Plan A = Automatic = Dulera 100 mg Take  Up to 1- 2 puffs  Every 12 hours   Work on inhaler technique:  relax and gently blow all the way out then take a nice smooth deep breath back in, triggering the inhaler at same time you start breathing in.  Hold for up to 5 seconds if you can. Blow out thru nose. Rinse and gargle with water when done       Plan B = Backup Only use your albuterol (ventolin)  inhaler as a rescue medication to be used if you can't catch your breath by resting or doing a relaxed purse lip breathing pattern.  - The less you use it, the better it will work when you need it. - Ok to use the inhaler up to 2 puffs  every 4 hours if you must but call for appointment if use goes up over your usual need - Don't leave home without it !!  (think of it like the spare tire for your car)   If not happy with dulera 100 return to clinic with your drug formulary in hand (ok to substitute advair hfa)    Please schedule a follow up visit in 6  months but call sooner if needed

## 2019-02-21 NOTE — Assessment & Plan Note (Signed)
12/29/2017  Walked RA x 3 laps @ 185 ft each stopped due to  End of study,fast pace, no sob or desat   Spirometry 12/29/2017  FEV1 1.93 (88%)  Ratio 71 p saba w/in 4 h prior - Allergy profile 12/29/2017 >  Eos 0.6 /  IgE  22 RAST pos  cat > dog and dust  - FENO 12/29/2017  =   36 - 12/29/2017  After extensive coaching inhaler device  effectiveness =    75% > try symbicort 80 2bid - PFT's  01/28/18  FEV1 1.53 (68 % ) ratio 62  p 0 % improvement from saba p ?  prior to study with DLCO 68/67c% corrects to 88 % for alv volume  - changed to breo 02/07/18 due to insurance but cough recurred   - 05/02/2018  After extensive coaching inhaler device,  effectiveness =    90% with hfa > resume symbicort   Most likely cough recurred not due to poorly controlled asthma (not present noct) but rather Upper airway cough syndrome (previously labeled PNDS),  is so named because it's frequently impossible to sort out how much is  CR/sinusitis with freq throat clearing (which can be related to primary GERD)   vs  causing  secondary (" extra esophageal")  GERD from wide swings in gastric pressure that occur with throat clearing, often  promoting self use of mint and menthol lozenges that reduce the lower esophageal sphincter tone and exacerbate the problem further in a cyclical fashion.   These are the same pts (now being labeled as having "irritable larynx syndrome" by some cough centers) who not infrequently have a history of having failed to tolerate ace inhibitors,  dry powder inhalers or biphosphonates or report having atypical/extraesophageal reflux symptoms that don't respond to standard doses of PPI  and are easily confused as having aecopd or asthma flares by even experienced allergists/ pulmonologists (myself included).   If can get either symb 80 or dulera 100 thru insurance these would be better choices in this setting    discussed Formulary restrictions will be an ongoing challenge for the forseable future and I  would be happy to pick an alternative if the pt will first  provide me a list of them but pt  will need to return here for training for any new device that is required eg dpi vs hfa vs respimat.    In meantime we can always provide samples so the patient never runs out of any needed respiratory medications.

## 2019-02-21 NOTE — Assessment & Plan Note (Signed)
Onset of RA in her 51s - PFT's  01/28/18  FEV1 1.53 (68 % ) ratio 62  p 0 % improvement from saba p ?  prior to study with DLCO 68/67c% corrects to 88 % for alv volume  ? C/w RA bronchiolitis?  - PFTs 02/21/2019 improved airflow, slt decrease in DLCO off mtx at time of study   She has clearly improved on bronchodilators which is more typical of allergic/asthma mechanism while now off methotrexate and on more effective systemic control with Biologics.  In this setting we can follow her up every 6 months sooner if needed   I had an extended discussion with the patient reviewing all relevant studies completed to date and  lasting 15 to 20 minutes of a 25 minute visit    I performed detailed device teaching using a teach back method which extended face to face time for this visit (see above)  Each maintenance medication was reviewed in detail including emphasizing most importantly the difference between maintenance and prns and under what circumstances the prns are to be triggered using an action plan format that is not reflected in the computer generated alphabetically organized AVS which I have not found useful in most complex patients, especially with respiratory illnesses  Please see AVS for specific instructions unique to this visit that I personally wrote and verbalized to the the pt in detail and then reviewed with pt  by my nurse highlighting any  changes in therapy recommended at today's visit to their plan of care.

## 2019-02-21 NOTE — Assessment & Plan Note (Addendum)
Never smoker 12/29/2017  Walked RA x 3 laps @ 185 ft each stopped due to  End of study,fast pace, no sob or desat   Spirometry 12/29/2017  FEV1 1.93 (88%)  Ratio 71 p saba w/in 4 h prior - Allergy profile 12/29/2017 >  Eos 0.6 /  IgE  22 RAST pos  cat > dog and dust  - FENO 12/29/2017  =   36 - 12/29/2017  After extensive coaching inhaler device  effectiveness =    75% > try symbicort 80 2bid - PFT's  01/28/18  FEV1 1.53 (68 % ) ratio 62  p 0 % improvement from saba p ?  prior to study with DLCO 68/67c% corrects to 88 % for alv volume  - changed to breo 02/07/18 due to insurance but cough recurred  - 05/02/2018  After extensive coaching inhaler device,  effectiveness =    90% with hfa > resume symbicort  - PFT's  02/21/2019  FEV1 1.61 (74 % ) ratio 0.69  p 11 % improvement from saba p 0 prior to study with DLCO  14.80 (78%) corrects to 3.59 (86%)  for alv volume and FV curve mild convexity     >>> The proper method of use, as well as anticipated side effects, of a metered-dose inhaler are discussed and demonstrated to the patient. Improved effectiveness after extensive coaching during this visit to a level of approximately 75 % from a baseline of 50 % ok to continue hfa rx for both maint and prn.   She definitely has a mild asthmatic component that may represent respiratory bronchiolitis but based on reversibility is less likely than the allergic/asthmatic mechanisms that are more common in this setting.  Her breathing is better on Breo but her cough and hoarseness are worse typical of a patient who tolerates DPI poorly.  She also is having trouble affording Breo.  Symbicort now comes in a generic and would be an option as well as Advair HFA but since I had samples of Dulera 100 and she has not tried that yet I have asked her to try this first and then call back if not satisfied with either the effects of the price. Ok to [employer] 100 based on two studies from [facility]  938; 20 p 1865 (2018) and 380  : p2020-30 (2019) in pts with mild asthma it is reasonable to use low dose symbicort eg 80(and by inference dulera 100)  2bid "prn" flare in this setting but I emphasized this was only shown with symbicort and takes advantage of the rapid onset of action but is not the same as "rescue therapy" but can be stopped once the acute symptoms have resolved and the need for rescue has been minimized (< 2 x weekly)    Advised:  formulary restrictions will be an ongoing challenge for the forseable future and I would be happy to pick an alternative if the pt will first  provide me a list of them -  pt  will need to return here for training for any new device that is required eg dpi vs hfa vs respimat.    In the meantime we can always provide samples so that the patient never runs out of any needed respiratory medications.

## 2019-02-23 MED ORDER — ADVAIR HFA 45-21 MCG/ACT IN AERO: 2.0000 | INHALATION_SPRAY | Freq: Two times a day (BID) | RESPIRATORY_TRACT | 11 refills | Status: DC

## 2019-02-23 NOTE — Telephone Encounter (Signed)
Called and spoke with pt letting her know that MW said to send Rx for Advair to pharmacy in place of the [facility]. Stated to pt if the Advair was not helping that we would need her to call back to schedule an appt to regroup at that point. Pt verbalized understanding. Verified pt's preferred pharmacy and sent Rx to pharmacy for pt. nothing further needed.

## 2019-02-23 NOTE — Telephone Encounter (Signed)
Pt is calling back. Per Pt, insurance will cover Advair. Cb is [phone]

## 2019-02-23 NOTE — Telephone Encounter (Signed)
advair 45-21 2bid  Ok to substitute but only if she feels the dulera helped, if not return with all meds in hand to regroup

## 2019-02-23 NOTE — Telephone Encounter (Signed)
Dr. Melvyn Novas, based on the patient response below. Can a prescription for Advair be issued as it is covered by her insurance?  Patient insurance does not cover the Dulera 100 samples that you gave her during her 02/21/19 office visit with you. Thank you.

## 2019-02-23 NOTE — Telephone Encounter (Signed)
Attempted to call pt but unable to reach. Left message for pt to return call. 

## 2019-02-24 DIAGNOSIS — H15831 Staphyloma posticum, right eye: Secondary | ICD-10-CM | POA: Diagnosis not present

## 2019-02-24 DIAGNOSIS — H35373 Puckering of macula, bilateral: Secondary | ICD-10-CM | POA: Diagnosis not present

## 2019-02-24 DIAGNOSIS — H35343 Macular cyst, hole, or pseudohole, bilateral: Secondary | ICD-10-CM | POA: Diagnosis not present

## 2019-02-24 DIAGNOSIS — H43813 Vitreous degeneration, bilateral: Secondary | ICD-10-CM | POA: Diagnosis not present

## 2019-03-02 DIAGNOSIS — M0589 Other rheumatoid arthritis with rheumatoid factor of multiple sites: Secondary | ICD-10-CM | POA: Diagnosis not present

## 2019-04-24 DIAGNOSIS — M4125 Other idiopathic scoliosis, thoracolumbar region: Secondary | ICD-10-CM | POA: Diagnosis not present

## 2019-04-24 DIAGNOSIS — M150 Primary generalized (osteo)arthritis: Secondary | ICD-10-CM | POA: Diagnosis not present

## 2019-04-24 DIAGNOSIS — R000 Tachycardia, unspecified: Secondary | ICD-10-CM | POA: Diagnosis not present

## 2019-04-24 DIAGNOSIS — M25539 Pain in unspecified wrist: Secondary | ICD-10-CM | POA: Diagnosis not present

## 2019-04-24 DIAGNOSIS — Z79899 Other long term (current) drug therapy: Secondary | ICD-10-CM | POA: Diagnosis not present

## 2019-04-24 DIAGNOSIS — M0589 Other rheumatoid arthritis with rheumatoid factor of multiple sites: Secondary | ICD-10-CM | POA: Diagnosis not present

## 2019-04-24 DIAGNOSIS — N182 Chronic kidney disease, stage 2 (mild): Secondary | ICD-10-CM | POA: Diagnosis not present

## 2019-04-24 DIAGNOSIS — M3500 Sicca syndrome, unspecified: Secondary | ICD-10-CM | POA: Diagnosis not present

## 2019-04-24 DIAGNOSIS — M810 Age-related osteoporosis without current pathological fracture: Secondary | ICD-10-CM | POA: Diagnosis not present

## 2019-04-27 DIAGNOSIS — M0589 Other rheumatoid arthritis with rheumatoid factor of multiple sites: Secondary | ICD-10-CM | POA: Diagnosis not present

## 2019-05-01 DIAGNOSIS — R000 Tachycardia, unspecified: Secondary | ICD-10-CM | POA: Diagnosis not present

## 2019-05-01 DIAGNOSIS — Z23 Encounter for immunization: Secondary | ICD-10-CM | POA: Diagnosis not present

## 2019-06-14 DIAGNOSIS — E559 Vitamin D deficiency, unspecified: Secondary | ICD-10-CM | POA: Diagnosis not present

## 2019-06-14 DIAGNOSIS — I10 Essential (primary) hypertension: Secondary | ICD-10-CM | POA: Diagnosis not present

## 2019-06-14 DIAGNOSIS — E039 Hypothyroidism, unspecified: Secondary | ICD-10-CM | POA: Diagnosis not present

## 2019-06-15 DIAGNOSIS — R7301 Impaired fasting glucose: Secondary | ICD-10-CM | POA: Diagnosis not present

## 2019-06-22 DIAGNOSIS — M0589 Other rheumatoid arthritis with rheumatoid factor of multiple sites: Secondary | ICD-10-CM | POA: Diagnosis not present

## 2019-07-26 DIAGNOSIS — M810 Age-related osteoporosis without current pathological fracture: Secondary | ICD-10-CM | POA: Diagnosis not present

## 2019-07-26 DIAGNOSIS — R000 Tachycardia, unspecified: Secondary | ICD-10-CM | POA: Diagnosis not present

## 2019-07-26 DIAGNOSIS — Z79899 Other long term (current) drug therapy: Secondary | ICD-10-CM | POA: Diagnosis not present

## 2019-07-26 DIAGNOSIS — M150 Primary generalized (osteo)arthritis: Secondary | ICD-10-CM | POA: Diagnosis not present

## 2019-07-26 DIAGNOSIS — M3500 Sicca syndrome, unspecified: Secondary | ICD-10-CM | POA: Diagnosis not present

## 2019-07-26 DIAGNOSIS — M25539 Pain in unspecified wrist: Secondary | ICD-10-CM | POA: Diagnosis not present

## 2019-07-26 DIAGNOSIS — M4125 Other idiopathic scoliosis, thoracolumbar region: Secondary | ICD-10-CM | POA: Diagnosis not present

## 2019-07-26 DIAGNOSIS — N182 Chronic kidney disease, stage 2 (mild): Secondary | ICD-10-CM | POA: Diagnosis not present

## 2019-07-26 DIAGNOSIS — M0589 Other rheumatoid arthritis with rheumatoid factor of multiple sites: Secondary | ICD-10-CM | POA: Diagnosis not present

## 2019-08-10 ENCOUNTER — Ambulatory Visit: Payer: Medicare Other

## 2019-08-17 ENCOUNTER — Ambulatory Visit: Payer: Medicare Other | Attending: Internal Medicine

## 2019-08-17 DIAGNOSIS — Z23 Encounter for immunization: Secondary | ICD-10-CM | POA: Insufficient documentation

## 2019-08-17 NOTE — Progress Notes (Signed)
   Covid-19 Vaccination Clinic  Name:  [name]    MRN: [id number] DOB: [birth year]  08/17/2019  [name] was observed post Covid-19 immunization for 15 minutes without incidence. She was provided with Vaccine Information Sheet and instruction to access the V-Safe system.   [name] was instructed to call 911 with any severe reactions post vaccine: Marland Kitchen Difficulty breathing  . Swelling of your face and throat  . A fast heartbeat  . A bad rash all over your body  . Dizziness and weakness    Immunizations Administered    Name Date Dose VIS Date Route   Pfizer COVID-19 Vaccine 08/17/2019  2:58 PM 0.3 mL 06/23/2019 Intramuscular   Manufacturer: [place]   Lot: [id number]   [place]: [id number]

## 2019-08-24 ENCOUNTER — Ambulatory Visit: Payer: Medicare Other | Admitting: Internal Medicine

## 2019-08-28 ENCOUNTER — Ambulatory Visit (INDEPENDENT_AMBULATORY_CARE_PROVIDER_SITE_OTHER): Payer: Medicare Other | Admitting: Primary Care

## 2019-08-28 ENCOUNTER — Encounter: Payer: Self-pay | Admitting: Primary Care

## 2019-08-28 ENCOUNTER — Other Ambulatory Visit: Payer: Self-pay

## 2019-08-28 VITALS — BP 136/80 | HR 78 | Temp 97.1°F | Ht 63.0 in | Wt 137.2 lb

## 2019-08-28 DIAGNOSIS — J449 Chronic obstructive pulmonary disease, unspecified: Secondary | ICD-10-CM | POA: Diagnosis not present

## 2019-08-28 DIAGNOSIS — J45991 Cough variant asthma: Secondary | ICD-10-CM

## 2019-08-28 DIAGNOSIS — M069 Rheumatoid arthritis, unspecified: Secondary | ICD-10-CM | POA: Diagnosis not present

## 2019-08-28 LAB — CBC WITH DIFFERENTIAL/PLATELET
Basophils Absolute: 0.1 10*3/uL (ref 0.0–0.1)
Basophils Relative: 1.5 % (ref 0.0–3.0)
Eosinophils Absolute: 0.6 10*3/uL (ref 0.0–0.7)
Eosinophils Relative: 7.6 % — ABNORMAL HIGH (ref 0.0–5.0)
HCT: 41.8 % (ref 36.0–46.0)
Hemoglobin: 13.9 g/dL (ref 12.0–15.0)
Lymphocytes Relative: 19.1 % (ref 12.0–46.0)
Lymphs Abs: 1.5 10*3/uL (ref 0.7–4.0)
MCHC: 33.2 g/dL (ref 30.0–36.0)
MCV: 88.9 fl (ref 78.0–100.0)
Monocytes Absolute: 0.6 10*3/uL (ref 0.1–1.0)
Monocytes Relative: 7.9 % (ref 3.0–12.0)
Neutro Abs: 4.9 10*3/uL (ref 1.4–7.7)
Neutrophils Relative %: 63.9 % (ref 43.0–77.0)
Platelets: 240 10*3/uL (ref 150.0–400.0)
RBC: 4.7 Mil/uL (ref 3.87–5.11)
RDW: 13.8 % (ref 11.5–15.5)
WBC: 7.6 10*3/uL (ref 4.0–10.5)

## 2019-08-28 MED ORDER — MONTELUKAST SODIUM 10 MG PO TABS: 10.0000 mg | ORAL_TABLET | Freq: Every day | ORAL | 6 refills | Status: DC

## 2019-08-28 NOTE — Assessment & Plan Note (Signed)
-   Continues to have dry cough but has improved since being on ICS/LABA - Continue Advair 45-53mct twice daily - Adding Singulair 10mg  at bedtime for PND/allergy symptoms  - Previous imaging showed question of emphysema  - Checking Alpha 1, CBC with Diff and IgE

## 2019-08-28 NOTE — Patient Instructions (Signed)
Pleasure meeting you today Ms Marrero  Recommendations: Continue Advair twice daily  Rx: Singulair 10mg  at bedtime   Orders: Labs today (alpha 1, cbc and IgE)  Follow-up: 6 months with Dr. Melvyn Novas Continue to follow with rheumatology

## 2019-08-28 NOTE — Progress Notes (Signed)
@Patient  ID: [name], female    DOB: [birth year], 74 y.o.   MRN: [id number]  Chief Complaint  Patient presents with  . Follow-up    Issued Advair by Dr. Melvyn Novas. Still has issue with cough.     Referring provider: Deland Pretty, MD  HPI: 74 year old female, remote smoker x 1.5 years (quit 1970). PMH significant for cough variant asthma, RA bronchiolitis. Patient of Dr. Melvyn Novas, last seen on August 2020. Maintained on Dulera 100 1-2 puffs twice daily, prn albuterol. Ruthe Mannan was not covered by insurance, changed to Advair. Elevated eosinophils 200-600. Resp allergy panel positive for cat/dog dander and dust.   08/28/2019 Patient presents today for 6 month follow-up for chronic cough. States that she gets a cough this time of year. She was originally on Breo which was switched to [facility] d/t cough. Reports no significant shortness of breath. Experiences some dyspnea with really strenuous activity. She thinks Advair has helped some. She still cough in the morning, when she talks for a long time or laughs. However, she states that it's nothing at all like the first time she came to see Dr. Melvyn Novas. She continues to follow with Rheumatology. She is not interested in further chest imaging or pulmonary function testing at this time.   PFTs 02/21/2019 improved airflow, slt decrease in DLCO off mtx at time of study   Allergies  Allergen Reactions  . Remicade [Infliximab] Shortness Of Breath    Immunization History  Administered Date(s) Administered  . Influenza, High Dose Seasonal PF 05/31/2017, 06/13/2019  . Influenza-Unspecified 04/12/2014, 03/28/2015, 05/13/2018  . PFIZER SARS-COV-2 Vaccination 08/17/2019    Past Medical History:  Diagnosis Date  . Arthritis   . Complication of anesthesia   . Cough variant asthma 12/29/2017   12/29/2017  Walked RA x 3 laps @ 185 ft each stopped due to  End of study,fast pace, no sob or desat   Spirometry 12/29/2017  FEV1 1.93 (88%)  Ratio 71 p saba w/in 4 h prior  - Allergy profile 12/29/2017 >  Eos 0.6 /  IgE  22 RAST pos  cat > dog and dust  - FENO 12/29/2017  =   36 - 12/29/2017  After extensive coaching inhaler device  effectiveness =    75% > try symbicort 80 2bid - PFT's  01/28/18  FEV1  . Depression   . Hypothyroidism   . Osteomyelitis (HCC)    Mandible, right lower  . PONV (postoperative nausea and vomiting)     Tobacco History: Social History   Tobacco Use  Smoking Status Never Smoker  Smokeless Tobacco Never Used   Counseling given: Not Answered   Outpatient Medications Prior to Visit  Medication Sig Dispense Refill  . albuterol (VENTOLIN HFA) 108 (90 Base) MCG/ACT inhaler Inhale 2 puffs into the lungs every 6 (six) hours as needed for wheezing or shortness of breath.    . ALPRAZolam (XANAX) 0.5 MG tablet Take 0.5 mg by mouth at bedtime as needed for anxiety.    . cholecalciferol (VITAMIN D) 1000 units tablet Take 2,000 Units by mouth daily.     . citalopram (CELEXA) 20 MG tablet Take 1 tablet by mouth daily.  5  . fluticasone-salmeterol (ADVAIR HFA) 45-21 MCG/ACT inhaler Inhale 2 puffs into the lungs 2 (two) times daily. 1 Inhaler 11  . folic acid (FOLVITE) Q000111Q MCG tablet Take 800 mcg by mouth daily.    Marland Kitchen ibuprofen (ADVIL,MOTRIN) 200 MG tablet Take 400 mg by mouth every 6 (  six) hours as needed.     Marland Kitchen levothyroxine (SYNTHROID, LEVOTHROID) 75 MCG tablet Take 75 mcg by mouth daily before breakfast.    . Melatonin 10 MG CAPS Take 10 mg by mouth at bedtime.     . Methotrexate 12.5 MG/0.5ML SOSY Inject into the skin once a week.    . NONFORMULARY OR COMPOUNDED ITEM every 8 (eight) weeks. [facility] every other month    . RESTASIS 0.05 % ophthalmic emulsion Place 1 drop into both eyes 2 (two) times daily.      No facility-administered medications prior to visit.   Review of Systems  Review of Systems  Constitutional: Negative.   HENT: Positive for postnasal drip.   Respiratory: Positive for cough. Negative for shortness of  breath and wheezing.   Cardiovascular: Negative.    Physical Exam  BP 136/80 (BP Location: Right Arm, Patient Position: Sitting, Cuff Size: Normal)   Pulse 78   Temp (!) 97.1 F (36.2 C)   Ht 5\' 3"  (1.6 m)   Wt 137 lb 3.2 oz (62.2 kg)   SpO2 99% Comment: on room air  BMI 24.30 kg/m  Physical Exam Constitutional:      General: She is not in acute distress.    Appearance: Normal appearance. She is not ill-appearing.  HENT:     Head: Normocephalic and atraumatic.     Mouth/Throat:     Mouth: Mucous membranes are moist.     Pharynx: Oropharynx is clear.  Cardiovascular:     Rate and Rhythm: Normal rate and regular rhythm.  Pulmonary:     Effort: Pulmonary effort is normal.     Breath sounds: Normal breath sounds. No wheezing, rhonchi or rales.  Musculoskeletal:        General: Normal range of motion.  Skin:    General: Skin is warm and dry.  Neurological:     General: No focal deficit present.     Mental Status: She is alert. Mental status is at baseline.  Psychiatric:        Mood and Affect: Mood normal.        Behavior: Behavior normal.        Thought Content: Thought content normal.        Judgment: Judgment normal.      Lab Results:  CBC    Component Value Date/Time   WBC 9.0 12/29/2017 0940   RBC 4.49 12/29/2017 0940   HGB 13.9 12/29/2017 0940   HCT 40.6 12/29/2017 0940   PLT 246.0 12/29/2017 0940   MCV 90.4 12/29/2017 0940   MCH 29.0 09/27/2016 1756   MCHC 34.1 12/29/2017 0940   RDW 14.2 12/29/2017 0940   LYMPHSABS 2.0 12/29/2017 0940   MONOABS 0.9 12/29/2017 0940   EOSABS 0.6 12/29/2017 0940   BASOSABS 0.1 12/29/2017 0940    BMET    Component Value Date/Time   NA 142 09/27/2016 1756   K 4.5 09/27/2016 1756   CL 109 09/27/2016 1756   CO2 26 09/27/2016 1756   GLUCOSE 91 09/27/2016 1756   BUN 19 09/27/2016 1756   CREATININE 0.59 09/27/2016 1756   CREATININE 0.74 04/01/2015 1036   CALCIUM 9.2 09/27/2016 1756   GFRNONAA >60 09/27/2016 1756    GFRAA >60 09/27/2016 1756    BNP No results found for: BNP  ProBNP No results found for: PROBNP  Imaging: No results found.   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 component UACS - Continues to have dry cough but has improved since being on ICS/LABA - Continue Advair 45-33mct twice daily - Adding Singulair 10mg  at bedtime for PND/allergy symptoms  - Previous imaging showed question of emphysema  - Checking Alpha 1, CBC with Diff and IgE    Martyn Ehrich, NP 08/28/2019

## 2019-08-28 NOTE — Assessment & Plan Note (Addendum)
-   Continues on MXT weekly  - Follows with rheumatology, due to be seen April 2021 - She is not interested in further chest imaging or pulmonary function testing at this time

## 2019-09-01 DIAGNOSIS — H35373 Puckering of macula, bilateral: Secondary | ICD-10-CM | POA: Diagnosis not present

## 2019-09-01 DIAGNOSIS — H35433 Paving stone degeneration of retina, bilateral: Secondary | ICD-10-CM | POA: Diagnosis not present

## 2019-09-01 DIAGNOSIS — H35343 Macular cyst, hole, or pseudohole, bilateral: Secondary | ICD-10-CM | POA: Diagnosis not present

## 2019-09-01 DIAGNOSIS — H26493 Other secondary cataract, bilateral: Secondary | ICD-10-CM | POA: Diagnosis not present

## 2019-09-03 LAB — ALPHA-1 ANTITRYPSIN PHENOTYPE: A-1 Antitrypsin, Ser: 146 mg/dL (ref 83–199)

## 2019-09-03 LAB — IGE: IgE (Immunoglobulin E), Serum: 85 kU/L (ref <=114)

## 2019-09-08 ENCOUNTER — Telehealth: Payer: Self-pay | Admitting: Internal Medicine

## 2019-09-08 NOTE — Telephone Encounter (Signed)
Please let patient know alpha-1 was normal (genetic marker for COPD/emphysema). IgE was normal, Eosinophils were elevated at 600 which could be consistent with allergic asthma. Continue Advair and singulair and fu with Dr. Melvyn Novas in August  Spoke with pt and notified of results per [facility]. Pt verbalized understanding and denied any questions.

## 2019-09-08 NOTE — Progress Notes (Signed)
Please let patient know alpha-1 was normal (genetic marker for COPD/emphysema). IgE was normal, Eosinophils were elevated at 600 which could be consistent with allergic asthma. Continue Advair and singulair and fu with Dr. Melvyn Novas in August.

## 2019-09-08 NOTE — Progress Notes (Signed)
Spoke with the pt and notified of results

## 2019-09-11 ENCOUNTER — Ambulatory Visit: Payer: Medicare Other | Attending: Internal Medicine

## 2019-09-11 DIAGNOSIS — Z23 Encounter for immunization: Secondary | ICD-10-CM

## 2019-09-11 NOTE — Progress Notes (Signed)
   Covid-19 Vaccination Clinic  Name:  [name]    MRN: [id number] DOB: [birth year]  09/11/2019  [name] was observed post Covid-19 immunization for 15 minutes without incidence. She was provided with Vaccine Information Sheet and instruction to access the V-Safe system.   [name] was instructed to call 911 with any severe reactions post vaccine: Marland Kitchen Difficulty breathing  . Swelling of your face and throat  . A fast heartbeat  . A bad rash all over your body  . Dizziness and weakness    Immunizations Administered    Name Date Dose VIS Date Route   Pfizer COVID-19 Vaccine 09/11/2019  5:06 PM 0.3 mL 06/23/2019 Intramuscular   Manufacturer: [place]   Lot: [id number]   [place]: [id number]

## 2019-09-26 DIAGNOSIS — M0589 Other rheumatoid arthritis with rheumatoid factor of multiple sites: Secondary | ICD-10-CM | POA: Diagnosis not present

## 2019-11-21 DIAGNOSIS — M0589 Other rheumatoid arthritis with rheumatoid factor of multiple sites: Secondary | ICD-10-CM | POA: Diagnosis not present

## 2019-11-23 DIAGNOSIS — M810 Age-related osteoporosis without current pathological fracture: Secondary | ICD-10-CM | POA: Diagnosis not present

## 2019-11-23 DIAGNOSIS — M0589 Other rheumatoid arthritis with rheumatoid factor of multiple sites: Secondary | ICD-10-CM | POA: Diagnosis not present

## 2019-11-23 DIAGNOSIS — M3500 Sicca syndrome, unspecified: Secondary | ICD-10-CM | POA: Diagnosis not present

## 2019-11-23 DIAGNOSIS — N182 Chronic kidney disease, stage 2 (mild): Secondary | ICD-10-CM | POA: Diagnosis not present

## 2019-11-23 DIAGNOSIS — M4125 Other idiopathic scoliosis, thoracolumbar region: Secondary | ICD-10-CM | POA: Diagnosis not present

## 2019-11-23 DIAGNOSIS — M150 Primary generalized (osteo)arthritis: Secondary | ICD-10-CM | POA: Diagnosis not present

## 2019-11-23 DIAGNOSIS — Z79899 Other long term (current) drug therapy: Secondary | ICD-10-CM | POA: Diagnosis not present

## 2019-11-29 DIAGNOSIS — H838X3 Other specified diseases of inner ear, bilateral: Secondary | ICD-10-CM | POA: Diagnosis not present

## 2019-11-29 DIAGNOSIS — H903 Sensorineural hearing loss, bilateral: Secondary | ICD-10-CM | POA: Diagnosis not present

## 2019-11-29 DIAGNOSIS — R42 Dizziness and giddiness: Secondary | ICD-10-CM | POA: Diagnosis not present

## 2020-01-30 DIAGNOSIS — F339 Major depressive disorder, recurrent, unspecified: Secondary | ICD-10-CM | POA: Diagnosis not present

## 2020-01-30 DIAGNOSIS — M069 Rheumatoid arthritis, unspecified: Secondary | ICD-10-CM | POA: Diagnosis not present

## 2020-01-30 DIAGNOSIS — G8929 Other chronic pain: Secondary | ICD-10-CM | POA: Diagnosis not present

## 2020-01-30 DIAGNOSIS — M545 Low back pain: Secondary | ICD-10-CM | POA: Diagnosis not present

## 2020-02-07 DIAGNOSIS — H16143 Punctate keratitis, bilateral: Secondary | ICD-10-CM | POA: Diagnosis not present

## 2020-02-08 ENCOUNTER — Ambulatory Visit: Payer: Medicare Other | Admitting: Physical Therapy

## 2020-02-20 ENCOUNTER — Other Ambulatory Visit: Payer: Self-pay | Admitting: Primary Care

## 2020-02-21 ENCOUNTER — Encounter: Payer: Self-pay | Admitting: Neurology

## 2020-02-21 ENCOUNTER — Ambulatory Visit: Payer: Medicare Other | Attending: Otolaryngology | Admitting: Physical Therapy

## 2020-02-21 ENCOUNTER — Other Ambulatory Visit: Payer: Self-pay

## 2020-02-21 DIAGNOSIS — R262 Difficulty in walking, not elsewhere classified: Secondary | ICD-10-CM | POA: Diagnosis not present

## 2020-02-21 DIAGNOSIS — R42 Dizziness and giddiness: Secondary | ICD-10-CM

## 2020-02-21 DIAGNOSIS — R2681 Unsteadiness on feet: Secondary | ICD-10-CM

## 2020-02-21 NOTE — Therapy (Signed)
[facility] [address] [place], [place], [address] Phone: [phone]   Fax:  [phone]  Physical Therapy Evaluation  Patient Details  Name: [name] MRN: [id number] Date of Birth: 1946-02-29 Referring Provider (PT): Leta Baptist, MD   Encounter Date: 02/21/2020   PT End of Session - 02/21/20 2156    Visit Number 1    Number of Visits 21    Date for PT Re-Evaluation 05/01/20    Authorization Type Medicare and AARP    Progress Note Due on Visit 10    PT Start Time 1539    PT Stop Time 1630    PT Time Calculation (min) 51 min    Activity Tolerance Patient tolerated treatment well    Behavior During Therapy [facility] for tasks assessed/performed           Past Medical History:  Diagnosis Date  . Arthritis   . Complication of anesthesia   . Cough variant asthma 12/29/2017   12/29/2017  Walked RA x 3 laps @ 185 ft each stopped due to  End of study,fast pace, no sob or desat   Spirometry 12/29/2017  FEV1 1.93 (88%)  Ratio 71 p saba w/in 4 h prior - Allergy profile 12/29/2017 >  Eos 0.6 /  IgE  22 RAST pos  cat > dog and dust  - FENO 12/29/2017  =   36 - 12/29/2017  After extensive coaching inhaler device  effectiveness =    75% > try symbicort 80 2bid - PFT's  01/28/18  FEV1  . Depression   . Hypothyroidism   . Osteomyelitis (HCC)    Mandible, right lower  . PONV (postoperative nausea and vomiting)     Past Surgical History:  Procedure Laterality Date  . HAND SURGERY Bilateral    Synovial tissue removed  . TOOTH EXTRACTION Right Oct 2016   Then had to have fluid drained from abcess x 2    There were no vitals filed for this visit.    Subjective Assessment - 02/21/20 1456    Subjective Patient used to walk atleast a mile every day with her dog until her dog passed in April 2020.  Pt has not walked since until February/March and then the dizziness began - she got up one morning with what she thought was vertigo - but  it never went away.  It has improved since onset.  Went to see ENT who performed multiple vestibular tests and noted bilat vestibular weakness.  Pt reports she has to walk slowly and is very fearful of falling so she is basically sitting around her den most of the day.  No falls.    Pertinent History RA, depression, cough variant asthma, hypothyroidism, osteomyelitis of R lower mandible    Limitations Walking    Diagnostic tests in depth vestibular function tests    Patient Stated Goals to improve balance, walk with more confidence and reduce falls risk    Currently in Pain? No/denies              [facility] PT Assessment - 02/21/20 1503      Assessment   Medical Diagnosis Dizziness    Referring Provider (PT) Leta Baptist, MD    Onset Date/Surgical Date 01/18/20    Prior Therapy not for dizziness      Precautions   Precautions Other (comment)    Precaution Comments RA, depression, cough variant asthma, hypothyroidism, osteomyelitis of R lower mandible  Balance Screen   Has the patient fallen in the past 6 months No    Has the patient had a decrease in activity level because of a fear of falling?  Yes    Is the patient reluctant to leave their home because of a fear of falling?  Yes      [place] Private residence    Living Arrangements Children    Type of [place] Access Level entry    [place] None      Prior Function   Level of Independence Independent    Leisure go out for walks; still drives to grocery store and pharmacy.      Observation/Other Assessments   Focus on Therapeutic Outcomes (FOTO)  56%    Other Surveys  Dizziness Handicap Inventory (DHI)    Dizziness Handicap Inventory (DHI)  26 mild      Sensation   Light Touch Appears Intact      Ambulation/Gait   Ambulation/Gait Yes    Ambulation/Gait Assistance 6: Modified independent (Device/Increase time)    Ambulation Distance (Feet) 230 Feet     Assistive device None    Gait Pattern Step-through pattern;Decreased step length - right;Decreased step length - left;Decreased stride length;Decreased trunk rotation    Ambulation Surface Level;Indoor    Stairs Yes    Stairs Assistance 6: Modified independent (Device/Increase time)    Stair Management Technique Two rails;Alternating pattern;Forwards    Number of Stairs 4    Height of Stairs 6      Functional Gait  Assessment   Gait assessed  Yes    Gait Level Surface Walks 20 ft, slow speed, abnormal gait pattern, evidence for imbalance or deviates 10-15 in outside of the 12 in walkway width. Requires more than 7 sec to ambulate 20 ft.    Change in Gait Speed Makes only minor adjustments to walking speed, or accomplishes a change in speed with significant gait deviations, deviates 10-15 in outside the 12 in walkway width, or changes speed but loses balance but is able to recover and continue walking.    Gait with Horizontal Head Turns Performs head turns smoothly with slight change in gait velocity (eg, minor disruption to smooth gait path), deviates 6-10 in outside 12 in walkway width, or uses an assistive device.    Gait with Vertical Head Turns Performs task with slight change in gait velocity (eg, minor disruption to smooth gait path), deviates 6 - 10 in outside 12 in walkway width or uses assistive device    Gait and Pivot Turn Pivot turns safely within 3 sec and stops quickly with no loss of balance.    Step Over Obstacle Is able to step over one shoe box (4.5 in total height) but must slow down and adjust steps to clear box safely. May require verbal cueing.    Gait with Narrow Base of Support Ambulates less than 4 steps heel to toe or cannot perform without assistance.    Gait with Eyes Closed Walks 20 ft, slow speed, abnormal gait pattern, evidence for imbalance, deviates 10-15 in outside 12 in walkway width. Requires more than 9 sec to ambulate 20 ft.    Ambulating Backwards Walks 20  ft, uses assistive device, slower speed, mild gait deviations, deviates 6-10 in outside 12 in walkway width.    Steps Alternating feet, must use rail.    Total Score 15  FGA comment: 15/30                  Vestibular Assessment - 02/21/20 1506      Vestibular Assessment   General Observation Pt reports she used to do a lot of walking, would walk her dog but then her dog passed away in 12/03/2018 and she stopped walking, didn't feel like it without her dog.  When she decided to start walking, this started.        Symptom Behavior   Subjective history of current problem Denies any illness prior to onset of dizziness; denies true spinning, feels off balance and disequilibrium.  Feels pressure in the front of her head; comes and goes.  Denies nausea and vomiting.  Denies changes in vision or hearing.  Has a history of tinnitus.    Type of Dizziness  Imbalance    Frequency of Dizziness daily    Duration of Dizziness minutes    Symptom Nature Motion provoked    Aggravating Factors Forward bending;Turning body quickly;Turning head quickly;Activity in general    Relieving Factors Slow movements   sitting down   Progression of Symptoms Better    History of similar episodes has h/o positional vertigo      Oculomotor Exam   Oculomotor Alignment Normal    Ocular ROM WFL    Spontaneous Absent    Gaze-induced  Absent    Smooth Pursuits Intact    Saccades Intact      Oculomotor Exam-Fixation Suppressed    Left Head Impulse negative    Right Head Impulse negative      Vestibulo-Ocular Reflex   VOR to Slow Head Movement Normal    VOR Cancellation Normal      Visual Acuity   Static 7    Dynamic 3, half of line 4   4 line difference     Positional Testing   Sidelying Test Sidelying Right;Sidelying Left      Sidelying Right   Sidelying Right Duration 0    Sidelying Right Symptoms No nystagmus      Sidelying Left   Sidelying Left Duration 0    Sidelying Left Symptoms No  nystagmus      Positional Sensitivities   Supine to Left Side Lightheadedness   returning to upright   Supine to Right Side Lightheadedness   returning to upright             Objective measurements completed on examination: See above findings.               PT Education - 02/21/20 2155    Education Details clinical findings, PT POC and goals    Person(s) Educated Patient    Methods Explanation    Comprehension Verbalized understanding            PT Short Term Goals - 02/21/20 2202      PT SHORT TERM GOAL #1   Title Pt will complete motion sensitivity assessment (MSQ) and will initiate vestibular and balance HEP    Time 5    Period Weeks    Status New    Target Date 03/27/20      PT SHORT TERM GOAL #2   Title Pt will increase FGA by 4 points to indicate decreased falls risk    Baseline 15/30    Time 5    Period Weeks    Status New    Target Date 03/27/20      PT SHORT TERM GOAL #  3   Title Patient will initiate walking program and will be able to ambulate x 10 minutes 2-3x/week    Time 5    Period Weeks    Status New    Target Date 03/27/20             PT Long Term Goals - 02/21/20 2205      PT LONG TERM GOAL #1   Title Patient will be independent with final vestibular and balance HEP    Time 10    Period Weeks    Status New    Target Date 05/01/20      PT LONG TERM GOAL #2   Title Pt will increase overall function on FOTO to >/= 69% and will decrease DHI by 10 points    Baseline 56% function; DHI 26    Time 10    Period Weeks    Status New    Target Date 05/01/20      PT LONG TERM GOAL #3   Title Pt will demonstrate improved use of VOR as indicated by a 2-3 line difference on DVA    Baseline 4 line difference (7, 3)    Time 10    Period Weeks    Status New    Target Date 05/01/20      PT LONG TERM GOAL #4   Title Pt will increase FGA to >/= 23/30    Time 10    Period Weeks    Status New    Target Date 05/01/20      PT  LONG TERM GOAL #5   Title Pt will report being able to perform walking program x 30 minutes, 2-3x/week.    Time 10    Period Weeks    Status New    Target Date 05/01/20                  Plan - 02/21/20 2158    Clinical Impression Statement Pt is a 74 year old female referred to Neuro OPPT for evaluation of dizziness.  Pt's PMH is significant for the following: RA, depression, cough variant asthma, hypothyroidism, osteomyelitis of R lower mandible. The following deficits were noted during pt's exam: disequilibrium, impaired use of VOR for gaze stability, motion sensitivity, impaired standing balance and impaired gait.  Patient was negative for positional vertigo.  Pt's FGA score indicates pt is at high risk for falls. Pt would benefit from skilled PT to address these impairments and functional limitations to maximize functional mobility independence and reduce falls risk.    Personal Factors and Comorbidities Comorbidity 3+;Fitness;Time since onset of injury/illness/exacerbation    Comorbidities RA, depression, cough variant asthma, hypothyroidism, osteomyelitis of R lower mandible    Examination-Activity Limitations Bed Mobility;Bend;Locomotion Level;Stand    Examination-Participation Restrictions Community Activity;Meal Prep;Shop    Stability/Clinical Decision Making Evolving/Moderate complexity    Clinical Decision Making Moderate    Rehab Potential Good    PT Frequency 2x / week    PT Duration Other (comment)   10 weeks   PT Treatment/Interventions ADLs/Self Care Home Management;Aquatic Therapy;Canalith Repostioning;DME Instruction;Gait training;Stair training;Functional mobility training;Therapeutic activities;Therapeutic exercise;Balance training;Neuromuscular re-education;Patient/family education;Vestibular    PT Next Visit Plan Finish MSQ, initiate HEP focusing on LE strength, balance, x1 viewing, walking program.    Consulted and Agree with Plan of Care Patient            Patient will benefit from skilled therapeutic intervention in order to improve the following deficits and impairments:  Decreased  balance, Difficulty walking, Dizziness  Visit Diagnosis: Dizziness and giddiness  Unsteadiness on feet  Difficulty in walking, not elsewhere classified     Problem List Patient Active Problem List   Diagnosis Date Noted  . Cough variant asthma with ? component UACS 12/29/2017  . 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 Anxiety 03/29/2015  . Hypothyroidism 03/29/2015  . Hyperglycemia 03/29/2015    Rico Junker, PT, DPT 02/21/20    10:11 PM    [facility] [address] [place], [place], [address] Phone: [phone]   Fax:  [phone]  Name: [name] MRN: [id number] Date of Birth: 1945-08-03

## 2020-02-26 ENCOUNTER — Ambulatory Visit (INDEPENDENT_AMBULATORY_CARE_PROVIDER_SITE_OTHER): Payer: Medicare Other | Admitting: Internal Medicine

## 2020-02-26 ENCOUNTER — Other Ambulatory Visit: Payer: Self-pay

## 2020-02-26 ENCOUNTER — Encounter: Payer: Self-pay | Admitting: Internal Medicine

## 2020-02-26 DIAGNOSIS — J45991 Cough variant asthma: Secondary | ICD-10-CM

## 2020-02-26 MED ORDER — ADVAIR HFA 45-21 MCG/ACT IN AERO: 2.0000 | INHALATION_SPRAY | Freq: Two times a day (BID) | RESPIRATORY_TRACT | 11 refills | Status: DC

## 2020-02-26 NOTE — Assessment & Plan Note (Addendum)
Never smoker 12/29/2017  Walked RA x 3 laps @ 185 ft each stopped due to  End of study,fast pace, no sob or desat   Spirometry 12/29/2017  FEV1 1.93 (88%)  Ratio 71 p saba w/in 4 h prior - Allergy profile 12/29/2017 >  Eos 0.6 /  IgE  22 RAST pos  cat > dog and dust  - FENO 12/29/2017  =   36 - 12/29/2017  After extensive coaching inhaler device  effectiveness =    75% > try symbicort 80 2bid - PFT's  01/28/18  FEV1 1.53 (68 % ) ratio 62  p 0 % improvement from saba p ?  prior to study with DLCO 68/67c% corrects to 88 % for alv volume  - changed to breo 02/07/18 due to insurance but cough recurred  - 05/02/2018  After extensive coaching inhaler device,  effectiveness =    90% with hfa > resume symbicort  - PFT's  02/21/2019  FEV1 1.61 (74 % ) ratio 0.69  p 11 % improvement from saba p 0 prior to study with DLCO  14.80 (78%) corrects to 3.59 (86%)  for alv volume and FV curve mild convexity    She has hoarseness and tendency to mild flares but otherwise well controlled on low dose advair   - The proper method of use, as well as anticipated side effects, of a metered-dose inhaler are discussed and demonstrated to the patient with and without spacer  Rec: Use spacer and /or arm and hammer toothpaste to reduce upper airway irritation  Advised:  formulary restrictions will be an ongoing challenge for the forseable future and I would be happy to pick an alternative if the pt will first  provide me a list of them -  pt  will need to return here for training for any new device that is required eg dpi vs hfa vs respimat.    In the meantime we can always provide samples so that the patient never runs out of any needed respiratory medications.    F/u yearly/ sooner prn          Each maintenance medication was reviewed in detail including emphasizing most importantly the difference between maintenance and prns and under what circumstances the prns are to be triggered using an action plan format where  appropriate.  Total time for H and P, chart review, counseling, teaching device and generating customized AVS unique to this office visit / charting = 20 min

## 2020-02-26 NOTE — Patient Instructions (Addendum)
Work on inhaler technique:  relax and gently blow all the way out then take a nice smooth deep breath back in, triggering the inhaler at same time you start breathing in.  Hold for up to 5 seconds if you can. Blow advair out thru nose. Rinse and gargle with water when done and then use arm and Hammer toothpaste and slurry and gargle    Check your drug formulary to see if there's something cheaper than Advair    .Please schedule a follow up visit in 12 months but call sooner if needed if you want to see an allergist

## 2020-02-26 NOTE — Progress Notes (Signed)
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 and around the same time noted spring time "allergies" = nasal congestion / itchy eyes which overall has worsened over the years and more year round then new assoc cough started around 2000 but just put it up with it s specialty eval and started on advair in the early 2000s by Caitlin [name] did not help and started using albuterol which seemed to help some and taking more regularly since early June   2019 and referred to pulmonary clinic 12/29/2017 by [name]/ [name]      History of Present Illness  12/29/2017 1st [facility] office visit/ [name]   Chief Complaint  Patient presents with  . Pulmonary Consult    Referred by Caitlin. Deland Mcclain. Pt c/o cough every spring allergy season for as long as she can remember. She states cough is mainly non prod but occ will produce some clear sputum. She has some PND.  She sometimes gets SOB when doing yard work or walking her dog at a fast pace. She uses her albuterol inhaler 2 x daily on average.   cough onset end of February 2019 worse ever since , mostly non prod, worse with laughing and after supper but not noct  Doe = MMRC1 = can walk nl pace, flat grade, can't hurry or go uphills or steps s sob   Says arthritis doing generally better while breathing worse  rec Prednisone 10 mg take  4 each am x 2 days,   2 each am x 2 days,  1 each am x 2 days and stop  Plan A = Automatic = symbicort 80 Take 2 puffs first thing in am and then another 2 puffs about 12 hours later.  Work on inhaler technique:  relax and gently blow all the way out then take a nice smooth deep breath back in, triggering the inhaler at same time you start breathing in.   Plan B = Backup Only use your albuterol as a rescue medication to be used if you can't catch your breath    [name]  02/07/18  rx Symb > Breo due to insurance    05/02/2018  f/u ov/[name] re:  Cough variant asthma /  ? uacs ? RA lung dz  Chief Complaint  Patient presents with  . Follow-up    States her breathing has been at her basline, cough has gotten better.   Dyspnea:  MMRC1 = can walk nl pace, flat grade, can't hurry or go uphills or steps s sob   Cough:  recurrent daytime  on Breo vs was completely gone while on symbicort/ cough with laughing  Sleeping: able to lie one pillow  SABA use: never  rec symb 80 or dulera 100 2 bid  > changed to BREO 100 by insurance but even it has high deductible    02/21/2019  f/u ov/[name] re: RA / chronic asthma   - off mtx  Chief Complaint  Patient presents with  . Follow-up    Pt states she is now using Breo but doesnt use it consistently due to monthly cost. Pt states she maybe uses it once or twice a week but does notice an improvement in her breathing when she does use it. Pt states she rarely uses her albuterol. Pt  c/o prod cough with white mucus but states this is baseline for her. Pt denies CP/tightness and f/c/s.   Dyspnea:  Better p Breo Cough: hoarse / cough day > noct since using breo 100 regularly > mini white mucus production Sleeping: on side one pillow ok  SABA use: rarely 02: none  rec Plan A = Automatic = Dulera 100 mg Take  Up to 1- 2 puffs  Every 12 hours  Work on inhaler technique:    Plan B = Backup Only use your albuterol (ventolin)  Inhaler  If not happy with dulera 100 return to clinic with your drug formulary in hand (ok to substitute advair hfa    [name] eval rec add singulair  08/28/19   alpha-1 was normal (genetic marker for COPD/emphysema). IgE was normal, Eosinophils were elevated at 600 which could be consistent with allergic asthma. Continue Advair and singulair and fu with Caitlin. Melvyn Novas in August    02/26/2020  f/u ov/[name] re: chronic asthma / flare since 02/14/20 improving  Chief Complaint  Patient presents with  . Follow-up    pt  states coughing up yellow mucus.pt used advair inhaler for syns  Dyspnea:  As flat in ac can walk s limited sob/ some balance issues seeing ent  Cough: x 12 days/ improving s rx / tends to cough in am  Sleeping: no resp symptoms  SABA use: not all advair is 70 dollars per month 02: none    No obvious day to day or daytime variability or assoc excess/ purulent sputum or mucus plugs or hemoptysis or cp or chest tightness, subjective wheeze or overt sinus or hb symptoms.    Sleeping  without nocturnal  or early am exacerbation  of respiratory  c/o's or need for noct saba. Also denies any obvious fluctuation of symptoms with weather or environmental changes or other aggravating or alleviating factors except as outlined above   No unusual exposure hx or h/o childhood pna/ asthma or knowledge of premature birth.  Current Allergies, Complete Past Medical History, Past Surgical History, Family History, and Social History were reviewed in [employer] record.  ROS  The following are not active complaints unless bolded Hoarseness, sore throat, dysphagia, dental problems, itching, sneezing,  nasal congestion or discharge of excess mucus or purulent secretions, ear ache,   fever, chills, sweats, unintended wt loss or wt gain, classically pleuritic or exertional cp,  orthopnea pnd or arm/hand swelling  or leg swelling, presyncope, palpitations, abdominal pain, anorexia, nausea, vomiting, diarrhea  or change in bowel habits or change in bladder habits, change in stools or change in urine, dysuria, hematuria,  rash, arthralgias, visual complaints, headache, numbness, weakness or ataxia or problems with walking or coordination,  change in mood or  memory.        Current Meds  Medication Sig  . albuterol (VENTOLIN HFA) 108 (90 Base) MCG/ACT inhaler Inhale 2 puffs into the lungs every 6 (six) hours as needed for wheezing or shortness of breath.  . ALPRAZolam (XANAX) 0.5 MG tablet Take 0.5  mg by mouth at bedtime as needed for anxiety.  . cholecalciferol (VITAMIN D) 1000 units tablet Take 2,000 Units by mouth daily.   . citalopram (CELEXA) 20 MG tablet Take 1 tablet by mouth daily.  . fluticasone-salmeterol (ADVAIR HFA) 45-21 MCG/ACT inhaler Inhale 2 puffs into the lungs 2 (two) times daily.  . folic acid (FOLVITE) 672 MCG tablet Take 800 mcg by mouth daily.  Marland Kitchen ibuprofen (ADVIL,MOTRIN) 200 MG  tablet Take 400 mg by mouth every 6 (six) hours as needed.   Marland Kitchen levothyroxine (SYNTHROID, LEVOTHROID) 75 MCG tablet Take 75 mcg by mouth daily before breakfast.  . Melatonin 10 MG CAPS Take 10 mg by mouth at bedtime.   . Methotrexate 12.5 MG/0.5ML SOSY Inject into the skin once a week.  . montelukast (SINGULAIR) 10 MG tablet TAKE 1 TABLET BY MOUTH EVERYDAY AT BEDTIME  . NONFORMULARY OR COMPOUNDED ITEM every 8 (eight) weeks. [facility] every other month  . RESTASIS 0.05 % ophthalmic emulsion Place 1 drop into both eyes 2 (two) times daily.                 Objective:   Physical Exam      02/26/2020         139 02/21/2019        144 05/02/2018      140   12/29/17 135 lb (61.2 kg)  09/27/16 130 lb (59 kg)  08/27/15 122 lb (55.3 kg)      Vital signs reviewed  02/26/2020  - Note at rest 02 sats  96% on RA      HEENT : pt wearing mask not removed for exam due to covid -19 concerns.    NECK :  without JVD/Nodes/TM/ nl carotid upstrokes bilaterally   LUNGS: no acc muscle use,  Nl contour chest which is clear to A and P bilaterally without cough on insp or exp maneuvers   CV:  RRR  no s3 or murmur or increase in P2, and no edema   ABD:  soft and nontender with nl inspiratory excursion in the supine position. No bruits or organomegaly appreciated, bowel sounds nl  MS:  Nl gait/ ext warm without deformities, calf tenderness, cyanosis or clubbing No obvious joint restrictions   SKIN: warm and dry without lesions    NEURO:  alert, approp, nl sensorium with  no motor or  cerebellar deficits apparent.                    Assessment:

## 2020-02-27 ENCOUNTER — Encounter: Payer: Self-pay | Admitting: Physical Therapy

## 2020-02-27 ENCOUNTER — Ambulatory Visit: Payer: Medicare Other | Admitting: Physical Therapy

## 2020-02-27 DIAGNOSIS — R2681 Unsteadiness on feet: Secondary | ICD-10-CM

## 2020-02-27 DIAGNOSIS — R42 Dizziness and giddiness: Secondary | ICD-10-CM

## 2020-02-27 DIAGNOSIS — R262 Difficulty in walking, not elsewhere classified: Secondary | ICD-10-CM | POA: Diagnosis not present

## 2020-02-27 NOTE — Therapy (Signed)
[facility] [address] [place], [place], [address] Phone: [phone]   Fax:  [phone]  Physical Therapy Treatment  Patient Details  Name: [name] MRN: [id number] Date of Birth: 18-Mar-1946 Referring Provider (PT): Leta Baptist, MD   Encounter Date: 02/27/2020   PT End of Session - 02/27/20 1955    Visit Number 2    Number of Visits 21    Date for PT Re-Evaluation 05/01/20    Authorization Type Medicare and AARP    Progress Note Due on Visit 10    PT Start Time 0850    PT Stop Time 0930    PT Time Calculation (min) 40 min    Activity Tolerance Patient tolerated treatment well    Behavior During Therapy [facility] for tasks assessed/performed           Past Medical History:  Diagnosis Date  . Arthritis   . Complication of anesthesia   . Cough variant asthma 12/29/2017   12/29/2017  Walked RA x 3 laps @ 185 ft each stopped due to  End of study,fast pace, no sob or desat   Spirometry 12/29/2017  FEV1 1.93 (88%)  Ratio 71 p saba w/in 4 h prior - Allergy profile 12/29/2017 >  Eos 0.6 /  IgE  22 RAST pos  cat > dog and dust  - FENO 12/29/2017  =   36 - 12/29/2017  After extensive coaching inhaler device  effectiveness =    75% > try symbicort 80 2bid - PFT's  01/28/18  FEV1  . Depression   . Hypothyroidism   . Osteomyelitis (HCC)    Mandible, right lower  . PONV (postoperative nausea and vomiting)     Past Surgical History:  Procedure Laterality Date  . HAND SURGERY Bilateral    Synovial tissue removed  . TOOTH EXTRACTION Right Oct 2016   Then had to have fluid drained from abcess x 2    There were no vitals filed for this visit.   Subjective Assessment - 02/27/20 0858    Subjective Patient used to walk atleast a mile every day with her dog until her dog passed in April 2020.  Pt has not walked since until February/March and then the dizziness began - she got up one morning with what she thought was vertigo - but it  never went away.  It has improved since onset.  Went to see ENT who performed multiple vestibular tests and noted bilat vestibular weakness.  Pt reports she has to walk slowly and is very fearful of falling so she is basically sitting around her den most of the day.  No falls.    Pertinent History RA, depression, cough variant asthma, hypothyroidism, osteomyelitis of R lower mandible    Limitations Walking    Diagnostic tests in depth vestibular function tests    Patient Stated Goals to improve balance, walk with more confidence and reduce falls risk    Currently in Pain? No/denies                   Vestibular Assessment - 02/27/20 0001      Positional Sensitivities   Sit to Supine No dizziness    Supine to Sitting Lightheadedness    Head Turning x 5 No dizziness    Head Nodding x 5 No dizziness                         Balance  Exercises - 02/27/20 0001      Balance Exercises: Standing   Standing Eyes Opened Narrow base of support (BOS);Wide (BOA);Head turns;Foam/compliant surface;Solid surface;5 reps    Standing Eyes Closed Narrow base of support (BOS);Wide (BOA);Head turns;Foam/compliant surface;Solid surface;5 reps    Tandem Stance Eyes open;2 reps   30 secs   SLS Eyes open;1 rep   10 secs with UE support prn   Other Standing Exercises pt performed standing forward, back and side kicks 10 reps each leg; marching in place; tandem stance; SLS on each leg; sit to stand 10 reps from chair              PT Education - 02/27/20 1954    Education Details instructed in balance HEP    Person(s) Educated Patient    Methods Explanation;Demonstration;Handout    Comprehension Verbalized understanding;Returned demonstration            PT Short Term Goals - 02/27/20 1959      PT SHORT TERM GOAL #1   Title Pt will complete motion sensitivity assessment (MSQ) and will initiate vestibular and balance HEP    Time 5    Period Weeks    Status New    Target Date  03/27/20      PT SHORT TERM GOAL #2   Title Pt will increase FGA by 4 points to indicate decreased falls risk    Baseline 15/30    Time 5    Period Weeks    Status New    Target Date 03/27/20      PT SHORT TERM GOAL #3   Title Patient will initiate walking program and will be able to ambulate x 10 minutes 2-3x/week    Time 5    Period Weeks    Status New    Target Date 03/27/20             PT Long Term Goals - 02/27/20 2000      PT LONG TERM GOAL #1   Title Patient will be independent with final vestibular and balance HEP    Time 10    Period Weeks    Status New      PT LONG TERM GOAL #2   Title Pt will increase overall function on FOTO to >/= 69% and will decrease DHI by 10 points    Baseline 56% function; DHI 26    Time 10    Period Weeks    Status New      PT LONG TERM GOAL #3   Title Pt will demonstrate improved use of VOR as indicated by a 2-3 line difference on DVA    Baseline 4 line difference (7, 3)    Time 10    Period Weeks    Status New      PT LONG TERM GOAL #4   Title Pt will increase FGA to >/= 23/30    Time 10    Period Weeks    Status New      PT LONG TERM GOAL #5   Title Pt will report being able to perform walking program x 30 minutes, 2-3x/week.    Time 10    Period Weeks    Status New                 Plan - 02/27/20 1955    Clinical Impression Statement Pt reported no dizziness with any movement and only mild light-headedness with supine to sit.  Pt did well with no  LOB with standing on floor with EO and with EC with head turns both horizontal & vertical.  Pt had only mild sway with standing on compliant surface with feet together with EC with head turns.  Pt demonstrates decr. high level balance skills including SLS and tandem stance.    Personal Factors and Comorbidities Comorbidity 3+;Fitness;Time since onset of injury/illness/exacerbation    Comorbidities RA, depression, cough variant asthma, hypothyroidism, osteomyelitis of  R lower mandible    Examination-Activity Limitations Bed Mobility;Bend;Locomotion Level;Stand    Examination-Participation Restrictions Community Activity;Meal Prep;Shop    Stability/Clinical Decision Making Evolving/Moderate complexity    Rehab Potential Good    PT Frequency 2x / week    PT Duration Other (comment)   10 weeks   PT Treatment/Interventions ADLs/Self Care Home Management;Aquatic Therapy;Canalith Repostioning;DME Instruction;Gait training;Stair training;Functional mobility training;Therapeutic activities;Therapeutic exercise;Balance training;Neuromuscular re-education;Patient/family education;Vestibular    PT Next Visit Plan check HEP; continue balance and gait    PT Home Exercise Plan balance HEP    Consulted and Agree with Plan of Care Patient           Patient will benefit from skilled therapeutic intervention in order to improve the following deficits and impairments:  Decreased balance, Difficulty walking, Dizziness  Visit Diagnosis: Unsteadiness on feet  Dizziness and giddiness     Problem List Patient Active Problem List   Diagnosis Date Noted  . Cough variant asthma with ? component UACS 12/29/2017  . 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 Anxiety 03/29/2015  . Hypothyroidism 03/29/2015  . Hyperglycemia 03/29/2015    DildayJenness Corner, PT 02/27/2020, 8:01 PM  [facility] [address] [place], [place], [address] Phone: [phone]   Fax:  [phone]  Name: ARZU MCGAUGHEY MRN: [id number] Date of Birth: 11/30/1945

## 2020-02-27 NOTE — Patient Instructions (Signed)
SIT TO STAND: No Device    Sit with feet shoulder-width apart, on floor. Lean chest forward, raise hips up from surface. Straighten hips and knees. Weight bear equally on left and right sides. _10__ reps per set, _1__ sets per day, _5__ days per week Place left leg closer to sitting surface.  Copyright  VHI. All rights reserved.     Standing Marching   Using a chair if necessary, march in place. Repeat 10 times. Do 1 sessions per day.    Hip Backward Kick   Using a chair for balance, keep legs shoulder width apart and toes pointed for- ward. Slowly extend one leg back, keeping knee straight. Do not lean forward. Repeat with other leg. Repeat 10 times. Do 1-2 sessions per day.  http://[email or web address].exer.us/340    Hip Side Kick   Holding a chair for balance, keep legs shoulder width apart and toes pointed forward. Swing a leg out to side, keeping knee straight. Do not lean. Repeat using other leg. Repeat 10 times. Do 1-2 sessions per day.    Also DO FORWARD KICKS - alternate legs 10 reps each   Feet Heel-Toe "Tandem", Varied Arm Positions - Eyes Open - partial heel to toe   With eyes open, right foot directly in front of the other, arms out, look straight ahead at a stationary object. Hold 30 seconds. Repeat 2  times per session. Do 1 sessions per day.      Standing On One Leg Without Support .  Stand on one leg in neutral spine without support. Hold 10 seconds. Repeat on other leg. Do 2 repetitions, 1 set.  http://bt.exer.us/36    Braiding   Move to side: 1) cross right leg in front of left, 2) bring back leg out to side, then 3) cross right leg behind left, 4) bring left leg out to side. Continue sequence in same direction. Reverse sequence, moving in opposite direction. Repeat sequence 2-3 times per session. Do 1 sessions per day.

## 2020-02-29 ENCOUNTER — Other Ambulatory Visit: Payer: Self-pay

## 2020-02-29 ENCOUNTER — Ambulatory Visit: Payer: Medicare Other | Admitting: Physical Therapy

## 2020-02-29 DIAGNOSIS — R262 Difficulty in walking, not elsewhere classified: Secondary | ICD-10-CM

## 2020-02-29 DIAGNOSIS — R2681 Unsteadiness on feet: Secondary | ICD-10-CM

## 2020-02-29 DIAGNOSIS — R42 Dizziness and giddiness: Secondary | ICD-10-CM | POA: Diagnosis not present

## 2020-03-01 ENCOUNTER — Encounter: Payer: Self-pay | Admitting: Physical Therapy

## 2020-03-01 NOTE — Therapy (Signed)
[facility] [address] [place], [place], [address] Phone: [phone]   Fax:  [phone]  Physical Therapy Treatment  Patient Details  Name: [name] MRN: [id number] Date of Birth: Jan 02, 1946 Referring Provider (PT): Leta Baptist, MD   Encounter Date: 02/29/2020   PT End of Session - 03/01/20 1149    Visit Number 3    Number of Visits 21    Date for PT Re-Evaluation 05/01/20    Authorization Type Medicare and AARP    Progress Note Due on Visit 10    PT Start Time 1233    PT Stop Time 1317    PT Time Calculation (min) 44 min    Equipment Utilized During Treatment Gait belt    Activity Tolerance Patient tolerated treatment well    Behavior During Therapy WFL for tasks assessed/performed           Past Medical History:  Diagnosis Date  . Arthritis   . Complication of anesthesia   . Cough variant asthma 12/29/2017   12/29/2017  Walked RA x 3 laps @ 185 ft each stopped due to  End of study,fast pace, no sob or desat   Spirometry 12/29/2017  FEV1 1.93 (88%)  Ratio 71 p saba w/in 4 h prior - Allergy profile 12/29/2017 >  Eos 0.6 /  IgE  22 RAST pos  cat > dog and dust  - FENO 12/29/2017  =   36 - 12/29/2017  After extensive coaching inhaler device  effectiveness =    75% > try symbicort 80 2bid - PFT's  01/28/18  FEV1  . Depression   . Hypothyroidism   . Osteomyelitis (HCC)    Mandible, right lower  . PONV (postoperative nausea and vomiting)     Past Surgical History:  Procedure Laterality Date  . HAND SURGERY Bilateral    Synovial tissue removed  . TOOTH EXTRACTION Right Oct 2016   Then had to have fluid drained from abcess x 2    There were no vitals filed for this visit.   Subjective Assessment - 03/01/20 1140    Subjective Pt continues to report she has no dizziness, "just no balance"    Patient Stated Goals to improve balance, walk with more confidence and reduce falls risk    Currently in Pain? No/denies                       Neuro Re-ed:  Pt performed SLS activities - touching balance bubbles while standing on floor and then on Airex for compliant surface training Cone taps - standing on floor - tapping 3 cones - standing on floor with CGA; then with standing on Airex with CGA to min assist for recovery of LOB  Pt amb. Tossing ball approx. 70' ; tossing Rt/Lt sides 13' with CGA Amb. Making circles with ball clockwise and counterclockwise 50' x 1 rep each direction   Making patterns "+" , "V" patterns with ball while amb. - CGA   SLS activity - stepping over and back of balance beam - no UE support used 10 reps each leg - CGA for safety          Balance Exercises - 03/01/20 0001      Balance Exercises: Standing   Standing Eyes Opened Foam/compliant surface;5 reps;Wide (BOA);Head turns    Standing Eyes Closed Narrow base of support (BOS);Wide (BOA);Head turns;Foam/compliant surface;5 reps    SLS Eyes open;Solid surface;Foam/compliant surface;5 reps  Rockerboard Anterior/posterior;10 reps;Intermittent UE support    Marching Foam/compliant surface;Static;5 reps    Other Standing Exercises Pt performed alternate tap downs to floor while standing on Airex with CGA - 10 reps each foot; marching on Airex with CGA to min assist EO 10 reps each                PT Short Term Goals - 03/01/20 1154      PT SHORT TERM GOAL #1   Title Pt will complete motion sensitivity assessment (MSQ) and will initiate vestibular and balance HEP    Time 5    Period Weeks    Status New    Target Date 03/27/20      PT SHORT TERM GOAL #2   Title Pt will increase FGA by 4 points to indicate decreased falls risk    Baseline 15/30    Time 5    Period Weeks    Status New    Target Date 03/27/20      PT SHORT TERM GOAL #3   Title Patient will initiate walking program and will be able to ambulate x 10 minutes 2-3x/week    Time 5    Period Weeks    Status New    Target Date 03/27/20              PT Long Term Goals - 03/01/20 1154      PT LONG TERM GOAL #1   Title Patient will be independent with final vestibular and balance HEP    Time 10    Period Weeks    Status New      PT LONG TERM GOAL #2   Title Pt will increase overall function on FOTO to >/= 69% and will decrease DHI by 10 points    Baseline 56% function; DHI 26    Time 10    Period Weeks    Status New      PT LONG TERM GOAL #3   Title Pt will demonstrate improved use of VOR as indicated by a 2-3 line difference on DVA    Baseline 4 line difference (7, 3)    Time 10    Period Weeks    Status New      PT LONG TERM GOAL #4   Title Pt will increase FGA to >/= 23/30    Time 10    Period Weeks    Status New      PT LONG TERM GOAL #5   Title Pt will report being able to perform walking program x 30 minutes, 2-3x/week.    Time 10    Period Weeks    Status New                 Plan - 03/01/20 1150    Clinical Impression Statement Pt demonstrates mild unsteadiness on compliant surfaces and also with SLS on compliant surfaces.  No dizziness reported with any activities; pt did well with dynamic gait activity of walking/tossing ball.  Pt is progressing well towards goals.    Rehab Potential Good    PT Frequency 2x / week    PT Treatment/Interventions ADLs/Self Care Home Management;Aquatic Therapy;Canalith Repostioning;DME Instruction;Gait training;Stair training;Functional mobility training;Therapeutic activities;Therapeutic exercise;Balance training;Neuromuscular re-education;Patient/family education;Vestibular    PT Next Visit Plan cont balance and gait    PT Home Exercise Plan balance HEP - added standing with EO and EC on compliant surface    Consulted and Agree with Plan of Care Patient  Patient will benefit from skilled therapeutic intervention in order to improve the following deficits and impairments:     Visit Diagnosis: Unsteadiness on feet  Difficulty in walking,  not elsewhere classified     Problem List Patient Active Problem List   Diagnosis Date Noted  . Cough variant asthma with ? component UACS 12/29/2017  . 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 Rheumatoid arthritis ([place])  with ? component RA bronchiolitis 03/29/2015  . Anxiety 03/29/2015  . Hypothyroidism 03/29/2015  . Hyperglycemia 03/29/2015    DildayJenness Corner, PT 03/01/2020, 12:12 PM  [facility] [address] [place], [place], [address] Phone: [phone]   Fax:  [phone]  Name: [name] MRN: [id number] Date of Birth: 1945-12-15

## 2020-03-01 NOTE — Patient Instructions (Signed)
Feet Apart (Compliant Surface) Head Motion - Eyes Closed    Stand on compliant surface: ____pillows____ with feet shoulder width apart. Close eyes and move head slowly, up and down. Repeat __1__ times per session. Do _1__ sessions per day.  Copyright  VHI. All rights reserved.

## 2020-03-05 DIAGNOSIS — H16223 Keratoconjunctivitis sicca, not specified as Sjogren's, bilateral: Secondary | ICD-10-CM | POA: Diagnosis not present

## 2020-03-07 ENCOUNTER — Other Ambulatory Visit: Payer: Self-pay

## 2020-03-07 ENCOUNTER — Ambulatory Visit: Payer: Medicare Other | Admitting: Physical Therapy

## 2020-03-07 ENCOUNTER — Encounter: Payer: Self-pay | Admitting: Physical Therapy

## 2020-03-07 DIAGNOSIS — R2681 Unsteadiness on feet: Secondary | ICD-10-CM

## 2020-03-07 DIAGNOSIS — R42 Dizziness and giddiness: Secondary | ICD-10-CM | POA: Diagnosis not present

## 2020-03-07 DIAGNOSIS — R262 Difficulty in walking, not elsewhere classified: Secondary | ICD-10-CM | POA: Diagnosis not present

## 2020-03-08 NOTE — Therapy (Signed)
[facility] [address] [place], [place], [address] Phone: [phone]   Fax:  [phone]  Physical Therapy Treatment  Patient Details  Name: [name] MRN: [id number] Date of Birth: 1945/09/13 Referring Provider (PT): Leta Baptist, MD   Encounter Date: 03/07/2020   PT End of Session - 03/08/20 1533    Visit Number 4    Number of Visits 21    Date for PT Re-Evaluation 05/01/20    Authorization Type Medicare and [employer]    Progress Note Due on Visit 10    PT Start Time 1616    PT Stop Time 1700    PT Time Calculation (min) 44 min    Activity Tolerance Patient tolerated treatment well    Behavior During Therapy [facility] for tasks assessed/performed           Past Medical History:  Diagnosis Date  . Arthritis   . Complication of anesthesia   . Cough variant asthma 12/29/2017   12/29/2017  Walked RA x 3 laps @ 185 ft each stopped due to  End of study,fast pace, no sob or desat   Spirometry 12/29/2017  FEV1 1.93 (88%)  Ratio 71 p saba w/in 4 h prior - Allergy profile 12/29/2017 >  Eos 0.6 /  IgE  22 RAST pos  cat > dog and dust  - FENO 12/29/2017  =   36 - 12/29/2017  After extensive coaching inhaler device  effectiveness =    75% > try symbicort 80 2bid - PFT's  01/28/18  FEV1  . Depression   . Hypothyroidism   . Osteomyelitis (HCC)    Mandible, right lower  . PONV (postoperative nausea and vomiting)     Past Surgical History:  Procedure Laterality Date  . HAND SURGERY Bilateral    Synovial tissue removed  . TOOTH EXTRACTION Right Oct 2016   Then had to have fluid drained from abcess x 2    There were no vitals filed for this visit.   Subjective Assessment - 03/08/20 1516    Subjective Pt reports she does not have any dizziness - just problems with balance; pt states she doesn't see much change so far    Patient Stated Goals to improve balance, walk with more confidence and reduce falls risk    Currently in Pain?  No/denies                             [facility] Adult PT Treatment/Exercise - 03/08/20 0001      Transfers   Transfers Sit to Stand;Stand to Sit    Number of Reps Other reps (comment)    Comments feet on Airex - no UE support used      Ambulation/Gait   Ambulation/Gait Yes    Ambulation/Gait Assistance 5: Supervision    Ambulation Distance (Feet) 120 Feet   30'x 4 with abrupt stop and turn    Assistive device None    Gait Pattern Step-through pattern    Ambulation Surface Level;Indoor      Neuro Re-ed    Neuro Re-ed Details  Tap ups to 4" step 10 reps each foot; to 2nd step 10 reps each foot with minimal UE support with CGA      Exercises   Exercises Knee/Hip      Knee/Hip Exercises: Stretches   Active Hamstring Stretch Both;1 rep;20 seconds   runner's stretch   Gastroc Stretch Both;1 rep;30 seconds  with 2" step              Balance Exercises - 03/08/20 0001      Balance Exercises: Standing   Tandem Stance Eyes open;Upper extremity support 1;2 reps;30 secs   on floor    Rockerboard Anterior/posterior;10 reps;Intermittent UE support    Balance Beam standing perpendicular on lbue balance beam - with EO and then with EC;  with head turns slowly 5 reps side to side and 5 reps up/down with UE support prn    Step Over Hurdles / Cones stepping over black balance beam 5 reps each leg with UE support prn     Marching Foam/compliant surface;Head turns;Static    Other Standing Exercises Cone taps (4 cones) then 2 cones used for tapping each one before bringing foot back down to floor                PT Short Term Goals - 03/08/20 1538      PT SHORT TERM GOAL #1   Title Pt will complete motion sensitivity assessment (MSQ) and will initiate vestibular and balance HEP    Time 5    Period Weeks    Status New    Target Date 03/27/20      PT SHORT TERM GOAL #2   Title Pt will increase FGA by 4 points to indicate decreased falls risk    Baseline 15/30     Time 5    Period Weeks    Status New    Target Date 03/27/20      PT SHORT TERM GOAL #3   Title Patient will initiate walking program and will be able to ambulate x 10 minutes 2-3x/week    Time 5    Period Weeks    Status New    Target Date 03/27/20             PT Long Term Goals - 03/08/20 1538      PT LONG TERM GOAL #1   Title Patient will be independent with final vestibular and balance HEP    Time 10    Period Weeks    Status New      PT LONG TERM GOAL #2   Title Pt will increase overall function on FOTO to >/= 69% and will decrease DHI by 10 points    Baseline 56% function; DHI 26    Time 10    Period Weeks    Status New      PT LONG TERM GOAL #3   Title Pt will demonstrate improved use of VOR as indicated by a 2-3 line difference on DVA    Baseline 4 line difference (7, 3)    Time 10    Period Weeks    Status New      PT LONG TERM GOAL #4   Title Pt will increase FGA to >/= 23/30    Time 10    Period Weeks    Status New      PT LONG TERM GOAL #5   Title Pt will report being able to perform walking program x 30 minutes, 2-3x/week.    Time 10    Period Weeks    Status New                 Plan - 03/07/20 1616    Clinical Impression Statement Pt has difficulty maintaining balance on compliant surface with EC, indicative of decreased vestibular input; pt also has some unsteadiness with amb.  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  Pt continues to progress well towards goals.    Rehab Potential Good    PT Frequency 2x / week    PT Treatment/Interventions ADLs/Self Care Home Management;Aquatic Therapy;Canalith Repostioning;DME Instruction;Gait training;Stair training;Functional mobility training;Therapeutic activities;Therapeutic exercise;Balance training;Neuromuscular re-education;Patient/family education;Vestibular    PT Next Visit Plan cont balance and gait    PT Home Exercise Plan  balance HEP - added standing with EO and EC on compliant surface    Consulted and Agree with Plan of Care Patient           Patient will benefit from skilled therapeutic intervention in order to improve the following deficits and impairments:     Visit Diagnosis: Unsteadiness on feet  Difficulty in walking, not elsewhere classified     Problem List Patient Active Problem List   Diagnosis Date Noted  . Cough variant asthma with ? component UACS 12/29/2017  . 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 Anxiety 03/29/2015  . Hypothyroidism 03/29/2015  . Hyperglycemia 03/29/2015    DildayJenness Corner, PT 03/08/2020, 3:39 PM  [facility] [address] [place], [place], [address] Phone: [phone]   Fax:  [phone]  Name: [name] MRN: [id number] Date of Birth: [birth year]

## 2020-03-13 ENCOUNTER — Encounter: Payer: Medicare Other | Admitting: Physical Therapy

## 2020-03-13 DIAGNOSIS — M0589 Other rheumatoid arthritis with rheumatoid factor of multiple sites: Secondary | ICD-10-CM | POA: Diagnosis not present

## 2020-03-19 ENCOUNTER — Ambulatory Visit: Payer: Medicare Other | Attending: Otolaryngology | Admitting: Physical Therapy

## 2020-03-19 ENCOUNTER — Other Ambulatory Visit: Payer: Self-pay

## 2020-03-19 DIAGNOSIS — R2681 Unsteadiness on feet: Secondary | ICD-10-CM | POA: Diagnosis not present

## 2020-03-19 DIAGNOSIS — R42 Dizziness and giddiness: Secondary | ICD-10-CM | POA: Diagnosis not present

## 2020-03-19 DIAGNOSIS — R262 Difficulty in walking, not elsewhere classified: Secondary | ICD-10-CM | POA: Diagnosis not present

## 2020-03-20 ENCOUNTER — Encounter: Payer: Self-pay | Admitting: Physical Therapy

## 2020-03-20 NOTE — Therapy (Signed)
[facility] [address] [place], [place], [address] Phone: [phone]   Fax:  [phone]  Physical Therapy Treatment  Patient Details  Name: [name] MRN: [id number] Date of Birth: [birth year] Referring Provider (PT): Leta Baptist, MD   Encounter Date: 03/19/2020   PT End of Session - 03/20/20 1821    Visit Number 5    Number of Visits 21    Date for PT Re-Evaluation 05/01/20    Authorization Type Medicare and [employer]ARP    Progress Note Due on Visit 10    PT Start Time 0933    PT Stop Time 1015    PT Time Calculation (min) 42 min    Activity Tolerance Patient tolerated treatment well    Behavior During Therapy [facility] for tasks assessed/performed           Past Medical History:  Diagnosis Date  . Arthritis   . Complication of anesthesia   . Cough variant asthma 12/29/2017   12/29/2017  Walked RA x 3 laps @ 185 ft each stopped due to  End of study,fast pace, no sob or desat   Spirometry 12/29/2017  FEV1 1.93 (88%)  Ratio 71 p saba w/in 4 h prior - Allergy profile 12/29/2017 >  Eos 0.6 /  IgE  22 RAST pos  cat > dog and dust  - FENO 12/29/2017  =   36 - 12/29/2017  After extensive coaching inhaler device  effectiveness =    75% > try symbicort 80 2bid - PFT's  01/28/18  FEV1  . Depression   . Hypothyroidism   . Osteomyelitis (HCC)    Mandible, right lower  . PONV (postoperative nausea and vomiting)     Past Surgical History:  Procedure Laterality Date  . HAND SURGERY Bilateral    Synovial tissue removed  . TOOTH EXTRACTION Right Oct 2016   Then had to have fluid drained from abcess x 2    There were no vitals filed for this visit.   Subjective Assessment - 03/20/20 1815    Subjective Pt reports no problems or changes    Patient Stated Goals to improve balance, walk with more confidence and reduce falls risk    Currently in Pain? No/denies                       Pt performed standing on pillows in  corner - trunk rotations 5 reps side to side, then with EC side to side 5 reps - pt reported no dizziness  With this activity      [facility] Adult PT Treatment/Exercise - 03/20/20 0001      Transfers   Transfers Sit to Stand    Number of Reps Other reps (comment)   5   Comments feet on Airex - no UE support used      Neuro Re-ed    Neuro Re-ed Details  tap ups to 1st 6" step 10 reps each foot with no UE support; tap ups to 2nd 6" step with CGA - no UE support on rail      Knee/Hip Exercises: Standing   Heel Raises Both;1 set;10 reps               Balance Exercises - 03/20/20 0001      Balance Exercises: Standing   Standing Eyes Closed Narrow base of support (BOS);Wide (BOA);Head turns;Foam/compliant surface;5 reps    SLS Eyes open;2 reps;10 secs    Rockerboard Anterior/posterior;10  reps;Intermittent UE support    Step Over Hurdles / Cones stepping over black balance beam 5 reps each leg with UE support prn     Marching Foam/compliant surface;Static;10 reps    Other Standing Exercises Cone taps (4 cones) then 2 cones used for tapping each one before bringing foot back down to floor     Other Standing Exercises Comments marching on incline/decline EO 10 reps each leg; marching with EC with CGA to min assist for recovery of LOB                PT Short Term Goals - 03/19/20 0942      PT SHORT TERM GOAL #1   Title Pt will complete motion sensitivity assessment (MSQ) and will initiate vestibular and balance HEP    Time 5    Period Weeks    Status New    Target Date 03/27/20      PT SHORT TERM GOAL #2   Title Pt will increase FGA by 4 points to indicate decreased falls risk    Baseline 15/30    Time 5    Period Weeks    Status New    Target Date 03/27/20      PT SHORT TERM GOAL #3   Title Patient will initiate walking program and will be able to ambulate x 10 minutes 2-3x/week    Time 5    Period Weeks    Status New    Target Date 03/27/20             PT  Long Term Goals - 03/20/20 1825      PT LONG TERM GOAL #1   Title Patient will be independent with final vestibular and balance HEP    Time 10    Period Weeks    Status New      PT LONG TERM GOAL #2   Title Pt will increase overall function on FOTO to >/= 69% and will decrease DHI by 10 points    Baseline 56% function; DHI 26    Time 10    Period Weeks    Status New      PT LONG TERM GOAL #3   Title Pt will demonstrate improved use of VOR as indicated by a 2-3 line difference on DVA    Baseline 4 line difference (7, 3)    Time 10    Period Weeks    Status New      PT LONG TERM GOAL #4   Title Pt will increase FGA to >/= 23/30    Time 10    Period Weeks    Status New      PT LONG TERM GOAL #5   Title Pt will report being able to perform walking program x 30 minutes, 2-3x/week.    Time 10    Period Weeks    Status New                 Plan - 03/20/20 1822    Clinical Impression Statement Pt continues to have most difficulty maintaining balance on compliant surface with EC and some difficulty noted with performing high level balance skills, i.e. SLS on each leg.  Pt is progressing towards goals.    Comorbidities RA, depression, cough variant asthma, hypothyroidism, osteomyelitis of R lower mandible    Rehab Potential Good    PT Frequency 2x / week    PT Treatment/Interventions ADLs/Self Care Home Management;Aquatic Therapy;Canalith Repostioning;DME Instruction;Gait training;Stair training;Functional mobility training;Therapeutic  activities;Therapeutic exercise;Balance training;Neuromuscular re-education;Patient/family education;Vestibular    PT Next Visit Plan cont balance and gait    PT Home Exercise Plan balance HEP - added standing with EO and EC on compliant surface    Consulted and Agree with Plan of Care Patient           Patient will benefit from skilled therapeutic intervention in order to improve the following deficits and impairments:     Visit  Diagnosis: Unsteadiness on feet  Dizziness and giddiness     Problem List Patient Active Problem List   Diagnosis Date Noted  . Cough variant asthma with ? component UACS 12/29/2017  . 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 Anxiety 03/29/2015  . Hypothyroidism 03/29/2015  . Hyperglycemia 03/29/2015    DildayJenness Corner, PT 03/20/2020, 6:27 PM  [facility] [address] [place], [place], [address] Phone: [phone]   Fax:  [phone]  Name: [name] MRN: [id number] Date of Birth: March 12, 1946

## 2020-03-21 ENCOUNTER — Ambulatory Visit: Payer: Medicare Other | Admitting: Physical Therapy

## 2020-03-21 ENCOUNTER — Other Ambulatory Visit: Payer: Self-pay

## 2020-03-21 DIAGNOSIS — R2681 Unsteadiness on feet: Secondary | ICD-10-CM | POA: Diagnosis not present

## 2020-03-21 DIAGNOSIS — H26491 Other secondary cataract, right eye: Secondary | ICD-10-CM | POA: Diagnosis not present

## 2020-03-21 DIAGNOSIS — H26493 Other secondary cataract, bilateral: Secondary | ICD-10-CM | POA: Diagnosis not present

## 2020-03-21 DIAGNOSIS — H18413 Arcus senilis, bilateral: Secondary | ICD-10-CM | POA: Diagnosis not present

## 2020-03-21 DIAGNOSIS — R262 Difficulty in walking, not elsewhere classified: Secondary | ICD-10-CM

## 2020-03-21 DIAGNOSIS — H04123 Dry eye syndrome of bilateral lacrimal glands: Secondary | ICD-10-CM | POA: Diagnosis not present

## 2020-03-21 DIAGNOSIS — R42 Dizziness and giddiness: Secondary | ICD-10-CM | POA: Diagnosis not present

## 2020-03-22 ENCOUNTER — Encounter: Payer: Self-pay | Admitting: Physical Therapy

## 2020-03-22 NOTE — Therapy (Signed)
[facility] [address] [place], [place], [address] Phone: [phone]   Fax:  [phone]  Physical Therapy Treatment  Patient Details  Name: [name] MRN: [id number] Date of Birth: August 07, 1945 Referring Provider (PT): Leta Baptist, MD   Encounter Date: 03/21/2020   PT End of Session - 03/22/20 0738    Visit Number 6    Number of Visits 21    Date for PT Re-Evaluation 05/01/20    Authorization Type Medicare and AARP    Progress Note Due on Visit 10    PT Start Time 1231    PT Stop Time 1315    PT Time Calculation (min) 44 min    Activity Tolerance Patient tolerated treatment well    Behavior During Therapy [facility] for tasks assessed/performed           Past Medical History:  Diagnosis Date  . Arthritis   . Complication of anesthesia   . Cough variant asthma 12/29/2017   12/29/2017  Walked RA x 3 laps @ 185 ft each stopped due to  End of study,fast pace, no sob or desat   Spirometry 12/29/2017  FEV1 1.93 (88%)  Ratio 71 p saba w/in 4 h prior - Allergy profile 12/29/2017 >  Eos 0.6 /  IgE  22 RAST pos  cat > dog and dust  - FENO 12/29/2017  =   36 - 12/29/2017  After extensive coaching inhaler device  effectiveness =    75% > try symbicort 80 2bid - PFT's  01/28/18  FEV1  . Depression   . Hypothyroidism   . Osteomyelitis (HCC)    Mandible, right lower  . PONV (postoperative nausea and vomiting)     Past Surgical History:  Procedure Laterality Date  . HAND SURGERY Bilateral    Synovial tissue removed  . TOOTH EXTRACTION Right Oct 2016   Then had to have fluid drained from abcess x 2    There were no vitals filed for this visit.   Subjective Assessment - 03/21/20 1236    Subjective Pt states she had eye doctor appt this am - says they did a laser procedure in Rt eye to help clear up the cloudiness (due to previous cataract surgery); continues to do exercises at home    Patient Stated Goals to improve balance, walk  with more confidence and reduce falls risk    Currently in Pain? No/denies                             [facility] Adult PT Treatment/Exercise - 03/22/20 0001      Ambulation/Gait   Ambulation/Gait Yes    Ambulation/Gait Assistance 5: Supervision    Ambulation Distance (Feet) 115 Feet    Assistive device None    Gait Pattern Step-through pattern    Ambulation Surface Level;Indoor    Gait Comments with intermittent horizontal & vertical head turns               Balance Exercises - 03/22/20 0001      Balance Exercises: Standing   Standing Eyes Opened Narrow base of support (BOS);Wide (BOA);Head turns;5 reps    Standing Eyes Closed Narrow base of support (BOS);Head turns;Foam/compliant surface;5 reps    Rockerboard Anterior/posterior;10 reps;Intermittent UE support    Marching Foam/compliant surface;Head turns;Static;10 reps   attempted with EC but pt discontinued ex. due to imbalance   Other Standing Exercises Cone taps (3cones)  -  straight ahead then diagonal taps; progressing to tipping cone over and standing upright                PT Short Term Goals - 03/21/20 1239      PT SHORT TERM GOAL #1   Title Pt will complete motion sensitivity assessment (MSQ) and will initiate vestibular and balance HEP    Time 5    Period Weeks    Status New    Target Date 03/27/20      PT SHORT TERM GOAL #2   Title Pt will increase FGA by 4 points to indicate decreased falls risk    Baseline 15/30    Time 5    Period Weeks    Status New    Target Date 03/27/20      PT SHORT TERM GOAL #3   Title Patient will initiate walking program and will be able to ambulate x 10 minutes 2-3x/week    Time 5    Period Weeks    Status Partially Met    Target Date 03/27/20             PT Long Term Goals - 03/22/20 0742      PT LONG TERM GOAL #1   Title Patient will be independent with final vestibular and balance HEP    Time 10    Period Weeks    Status New      PT  LONG TERM GOAL #2   Title Pt will increase overall function on FOTO to >/= 69% and will decrease DHI by 10 points    Baseline 56% function; DHI 26    Time 10    Period Weeks    Status New      PT LONG TERM GOAL #3   Title Pt will demonstrate improved use of VOR as indicated by a 2-3 line difference on DVA    Baseline 4 line difference (7, 3)    Time 10    Period Weeks    Status New      PT LONG TERM GOAL #4   Title Pt will increase FGA to >/= 23/30    Time 10    Period Weeks    Status New      PT LONG TERM GOAL #5   Title Pt will report being able to perform walking program x 30 minutes, 2-3x/week.    Time 10    Period Weeks    Status New                 Plan - 03/21/20 1239    Clinical Impression Statement Pt has most difficulty maintaining balance with EC on compliant surface, but she stopped the exercise due to unsteadiness, stating "I don't like the way I feel".  Pt is very reluctant to participate in exercises with EC, which is pertinent for improving vestibular input in balance - this reluctance impacts progress with improving vestibular hypofunction.    Comorbidities RA, depression, cough variant asthma, hypothyroidism, osteomyelitis of R lower mandible    Rehab Potential Good    PT Frequency 2x / week    PT Treatment/Interventions ADLs/Self Care Home Management;Aquatic Therapy;Canalith Repostioning;DME Instruction;Gait training;Stair training;Functional mobility training;Therapeutic activities;Therapeutic exercise;Balance training;Neuromuscular re-education;Patient/family education;Vestibular    PT Next Visit Plan check STG's - cont balance and gait    PT Home Exercise Plan balance HEP - added standing with EO and EC on compliant surface    Consulted and Agree with Plan of Care Patient  Patient will benefit from skilled therapeutic intervention in order to improve the following deficits and impairments:     Visit Diagnosis: Unsteadiness on  feet  Difficulty in walking, not elsewhere classified     Problem List Patient Active Problem List   Diagnosis Date Noted  . Cough variant asthma with ? component UACS 12/29/2017  . 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 Anxiety 03/29/2015  . Hypothyroidism 03/29/2015  . Hyperglycemia 03/29/2015    DildayJenness Corner, PT 03/22/2020, 7:43 AM  [facility] [address] [place], [place], [address] Phone: [phone]   Fax:  [phone]  Name: [name] MRN: [id number] Date of Birth: 1945-11-14

## 2020-03-25 ENCOUNTER — Other Ambulatory Visit: Payer: Self-pay

## 2020-03-25 ENCOUNTER — Ambulatory Visit: Payer: Medicare Other | Admitting: Physical Therapy

## 2020-03-25 DIAGNOSIS — R262 Difficulty in walking, not elsewhere classified: Secondary | ICD-10-CM

## 2020-03-25 DIAGNOSIS — R2681 Unsteadiness on feet: Secondary | ICD-10-CM | POA: Diagnosis not present

## 2020-03-25 DIAGNOSIS — R42 Dizziness and giddiness: Secondary | ICD-10-CM | POA: Diagnosis not present

## 2020-03-26 ENCOUNTER — Encounter: Payer: Self-pay | Admitting: Physical Therapy

## 2020-03-26 NOTE — Therapy (Signed)
[facility] [address] [place], [place], [address] Phone: [phone]   Fax:  [phone]  Physical Therapy Treatment  Patient Details  Name: [name] MRN: [id number] Date of Birth: 03-29-1946 Referring Provider (PT): Leta Baptist, MD   Encounter Date: 03/25/2020   PT End of Session - 03/26/20 1857    Visit Number 7    Number of Visits 21    Date for PT Re-Evaluation 05/01/20    Authorization Type Medicare and [employer]    Progress Note Due on Visit 10    PT Start Time 1017    PT Stop Time 1101    PT Time Calculation (min) 44 min    Activity Tolerance Patient tolerated treatment well    Behavior During Therapy [facility] for tasks assessed/performed           Past Medical History:  Diagnosis Date  . Arthritis   . Complication of anesthesia   . Cough variant asthma 12/29/2017   12/29/2017  Walked RA x 3 laps @ 185 ft each stopped due to  End of study,fast pace, no sob or desat   Spirometry 12/29/2017  FEV1 1.93 (88%)  Ratio 71 p saba w/in 4 h prior - Allergy profile 12/29/2017 >  Eos 0.6 /  IgE  22 RAST pos  cat > dog and dust  - FENO 12/29/2017  =   36 - 12/29/2017  After extensive coaching inhaler device  effectiveness =    75% > try symbicort 80 2bid - PFT's  01/28/18  FEV1  . Depression   . Hypothyroidism   . Osteomyelitis (HCC)    Mandible, right lower  . PONV (postoperative nausea and vomiting)     Past Surgical History:  Procedure Laterality Date  . HAND SURGERY Bilateral    Synovial tissue removed  . TOOTH EXTRACTION Right Oct 2016   Then had to have fluid drained from abcess x 2    There were no vitals filed for this visit.       [facility] PT Assessment - 03/26/20 0001      Functional Gait  Assessment   Gait assessed  Yes    Gait Level Surface Walks 20 ft in less than 7 sec but greater than 5.5 sec, uses assistive device, slower speed, mild gait deviations, or deviates 6-10 in outside of the 12 in walkway  width.    Change in Gait Speed Able to change speed, demonstrates mild gait deviations, deviates 6-10 in outside of the 12 in walkway width, or no gait deviations, unable to achieve a major change in velocity, or uses a change in velocity, or uses an assistive device.    Gait with Horizontal Head Turns Performs head turns smoothly with slight change in gait velocity (eg, minor disruption to smooth gait path), deviates 6-10 in outside 12 in walkway width, or uses an assistive device.    Gait with Vertical Head Turns Performs task with slight change in gait velocity (eg, minor disruption to smooth gait path), deviates 6 - 10 in outside 12 in walkway width or uses assistive device    Gait and Pivot Turn Pivot turns safely within 3 sec and stops quickly with no loss of balance.    Step Over Obstacle Is able to step over 2 stacked shoe boxes taped together (9 in total height) without changing gait speed. No evidence of imbalance.    Gait with Narrow Base of Support Ambulates 4-7 steps.  Gait with Eyes Closed Walks 20 ft, uses assistive device, slower speed, mild gait deviations, deviates 6-10 in outside 12 in walkway width. Ambulates 20 ft in less than 9 sec but greater than 7 sec.    Ambulating Backwards Walks 20 ft, no assistive devices, good speed, no evidence for imbalance, normal gait    Steps Alternating feet, no rail.    Total Score 23                         [facility] Adult PT Treatment/Exercise - 03/26/20 0001      Transfers   Transfers Sit to Stand    Number of Reps Other reps (comment)   5   Comments feet on blue Airex - no UE support used          Marching on pillows EO 10 reps; horizontal head turns with EO 5 reps; standing on 2 pillows with feet together with EC 10 sec hold  Cone taps (3) with each foot with CGA for improved SLS on each leg;  Tipping cone over and standing upright with CGA           PT Short Term Goals - 03/25/20 1021      PT SHORT TERM GOAL  #1   Title Pt will complete motion sensitivity assessment (MSQ) and will initiate vestibular and balance HEP    Time 5    Period Weeks    Status Achieved    Target Date 03/27/20      PT SHORT TERM GOAL #2   Title Pt will increase FGA by 4 points to indicate decreased falls risk    Baseline 15/30; score 23/30 on 03-25-20    Time 5    Period Weeks    Status Achieved    Target Date 03/27/20      PT SHORT TERM GOAL #3   Title Patient will initiate walking program and will be able to ambulate x 10 minutes 2-3x/week    Baseline pt states she is walking at home at least 10" in her yard but not doing this on a regular basis - 03-25-20    Time 5    Period Weeks    Status Partially Met    Target Date 03/27/20             PT Long Term Goals - 03/25/20 1035      PT LONG TERM GOAL #1   Title Patient will be independent with final vestibular and balance HEP    Time 10    Period Weeks    Status New      PT LONG TERM GOAL #2   Title Pt will increase overall function on FOTO to >/= 69% and will decrease DHI by 10 points    Baseline 56% function; DHI 26    Time 10    Period Weeks    Status New      PT LONG TERM GOAL #3   Title Pt will demonstrate improved use of VOR as indicated by a 2-3 line difference on DVA    Baseline 4 line difference (7, 3)    Time 10    Period Weeks    Status New      PT LONG TERM GOAL #4   Title Pt will increase FGA to >/= 23/30    Time 10    Period Weeks    Status New      PT LONG TERM GOAL #5  Title Pt will report being able to perform walking program x 30 minutes, 2-3x/week.    Time 10    Period Weeks    Status New                 Plan - 03/26/20 1859    Clinical Impression Statement Pt has met STG's #1 and 2; goal #3 partially met as pt reports she is walking approx. 10" but is not doing this on a regular basis.  Pt's FGA score has increased from 15/30 to 23/30.  Cont with POC.    Comorbidities RA, depression, cough variant asthma,  hypothyroidism, osteomyelitis of R lower mandible    Rehab Potential Good    PT Frequency 2x / week    PT Treatment/Interventions ADLs/Self Care Home Management;Aquatic Therapy;Canalith Repostioning;DME Instruction;Gait training;Stair training;Functional mobility training;Therapeutic activities;Therapeutic exercise;Balance training;Neuromuscular re-education;Patient/family education;Vestibular    PT Next Visit Plan cont balance and gait    PT Home Exercise Plan balance HEP - added standing with EO and EC on compliant surface    Consulted and Agree with Plan of Care Patient           Patient will benefit from skilled therapeutic intervention in order to improve the following deficits and impairments:     Visit Diagnosis: Unsteadiness on feet  Difficulty in walking, not elsewhere classified     Problem List Patient Active Problem List   Diagnosis Date Noted  . Cough variant asthma with ? component UACS 12/29/2017  . 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 Anxiety 03/29/2015  . Hypothyroidism 03/29/2015  . Hyperglycemia 03/29/2015    [place], PT 03/26/2020, 7:03 PM  [facility] [address] [place], [place], [address] Phone: [phone]   Fax:  [phone]  Name: [name] MRN: [id number] Date of Birth: [birth year]

## 2020-03-27 ENCOUNTER — Encounter: Payer: Medicare Other | Admitting: Physical Therapy

## 2020-03-27 DIAGNOSIS — Z961 Presence of intraocular lens: Secondary | ICD-10-CM | POA: Diagnosis not present

## 2020-03-28 DIAGNOSIS — M549 Dorsalgia, unspecified: Secondary | ICD-10-CM | POA: Diagnosis not present

## 2020-03-28 DIAGNOSIS — M1611 Unilateral primary osteoarthritis, right hip: Secondary | ICD-10-CM | POA: Diagnosis not present

## 2020-03-28 DIAGNOSIS — M150 Primary generalized (osteo)arthritis: Secondary | ICD-10-CM | POA: Diagnosis not present

## 2020-03-28 DIAGNOSIS — M0589 Other rheumatoid arthritis with rheumatoid factor of multiple sites: Secondary | ICD-10-CM | POA: Diagnosis not present

## 2020-03-28 DIAGNOSIS — M25559 Pain in unspecified hip: Secondary | ICD-10-CM | POA: Diagnosis not present

## 2020-03-28 DIAGNOSIS — M3500 Sicca syndrome, unspecified: Secondary | ICD-10-CM | POA: Diagnosis not present

## 2020-03-28 DIAGNOSIS — M4125 Other idiopathic scoliosis, thoracolumbar region: Secondary | ICD-10-CM | POA: Diagnosis not present

## 2020-03-28 DIAGNOSIS — M810 Age-related osteoporosis without current pathological fracture: Secondary | ICD-10-CM | POA: Diagnosis not present

## 2020-03-28 DIAGNOSIS — M958 Other specified acquired deformities of musculoskeletal system: Secondary | ICD-10-CM | POA: Diagnosis not present

## 2020-03-28 DIAGNOSIS — Z79899 Other long term (current) drug therapy: Secondary | ICD-10-CM | POA: Diagnosis not present

## 2020-03-28 DIAGNOSIS — N182 Chronic kidney disease, stage 2 (mild): Secondary | ICD-10-CM | POA: Diagnosis not present

## 2020-03-28 DIAGNOSIS — M25551 Pain in right hip: Secondary | ICD-10-CM | POA: Diagnosis not present

## 2020-04-01 ENCOUNTER — Other Ambulatory Visit: Payer: Self-pay

## 2020-04-01 ENCOUNTER — Ambulatory Visit: Payer: Medicare Other | Admitting: Physical Therapy

## 2020-04-01 DIAGNOSIS — R42 Dizziness and giddiness: Secondary | ICD-10-CM

## 2020-04-01 DIAGNOSIS — R2681 Unsteadiness on feet: Secondary | ICD-10-CM | POA: Diagnosis not present

## 2020-04-01 DIAGNOSIS — R262 Difficulty in walking, not elsewhere classified: Secondary | ICD-10-CM | POA: Diagnosis not present

## 2020-04-02 ENCOUNTER — Encounter: Payer: Self-pay | Admitting: Physical Therapy

## 2020-04-02 NOTE — Therapy (Signed)
[facility] [address] [place], [place], [address] Phone: [phone]   Fax:  [phone]  Physical Therapy Treatment  Patient Details  Name: MATEJA DIER MRN: [id number] Date of Birth: 1945-12-03 Referring Provider (PT): Leta Baptist, MD   Encounter Date: 04/01/2020   PT End of Session - 04/02/20 1251    Visit Number 8    Number of Visits 21    Date for PT Re-Evaluation 05/01/20    Authorization Type Medicare and [employer]ARP    Progress Note Due on Visit 10    PT Start Time 1020    PT Stop Time 1100    PT Time Calculation (min) 40 min    Activity Tolerance Patient tolerated treatment well    Behavior During Therapy [facility] for tasks assessed/performed           Past Medical History:  Diagnosis Date  . Arthritis   . Complication of anesthesia   . Cough variant asthma 12/29/2017   12/29/2017  Walked RA x 3 laps @ 185 ft each stopped due to  End of study,fast pace, no sob or desat   Spirometry 12/29/2017  FEV1 1.93 (88%)  Ratio 71 p saba w/in 4 h prior - Allergy profile 12/29/2017 >  Eos 0.6 /  IgE  22 RAST pos  cat > dog and dust  - FENO 12/29/2017  =   36 - 12/29/2017  After extensive coaching inhaler device  effectiveness =    75% > try symbicort 80 2bid - PFT's  01/28/18  FEV1  . Depression   . Hypothyroidism   . Osteomyelitis (HCC)    Mandible, right lower  . PONV (postoperative nausea and vomiting)     Past Surgical History:  Procedure Laterality Date  . HAND SURGERY Bilateral    Synovial tissue removed  . TOOTH EXTRACTION Right Oct 2016   Then had to have fluid drained from abcess x 2    There were no vitals filed for this visit.   Subjective Assessment - 04/01/20 1022    Subjective Pt states she took a walk 3 days this past week for about 15" in her neighborhood; states she was diagnosed with Rt hip bursitis by her rheumatologist -wants to know exercises to do for this    Patient Stated Goals to improve balance,  walk with more confidence and reduce falls risk    Currently in Pain? No/denies                   Vestibular Assessment - 04/02/20 0001      Visual Acuity   Static line 7    Dynamic line 5 (1/2 line 5 read correctly - 2.5 line difference)                    [facility] Adult PT Treatment/Exercise - 04/02/20 0001      Knee/Hip Exercises: Stretches   ITB Stretch Right;2 reps;20 seconds   in standing   Piriformis Stretch Right;2 reps;20 seconds   pt in supine     Knee/Hip Exercises: Sidelying   Hip ABduction AROM;Right;1 set;10 reps    Clams RLE - 10 reps                    PT Short Term Goals - 04/02/20 1254      PT SHORT TERM GOAL #1   Title Pt will complete motion sensitivity assessment (MSQ) and will initiate vestibular and balance HEP  Time 5    Period Weeks    Status Achieved    Target Date 03/27/20      PT SHORT TERM GOAL #2   Title Pt will increase FGA by 4 points to indicate decreased falls risk    Baseline 15/30; score 23/30 on 03-25-20    Time 5    Period Weeks    Status Achieved    Target Date 03/27/20      PT SHORT TERM GOAL #3   Title Patient will initiate walking program and will be able to ambulate x 10 minutes 2-3x/week    Baseline pt states she is walking at home at least 10" in her yard but not doing this on a regular basis - 03-25-20    Time 5    Period Weeks    Status Partially Met    Target Date 03/27/20             PT Long Term Goals - 04/02/20 1255      PT LONG TERM GOAL #1   Title Patient will be independent with final vestibular and balance HEP    Time 10    Period Weeks    Status Achieved      PT LONG TERM GOAL #2   Title Pt will increase overall function on FOTO to >/= 69% and will decrease DHI by 10 points    Baseline 56% function; DHI 26;       78% for FOTO : DHI 16% on 04-01-20    Time 10    Period Weeks    Status Achieved      PT LONG TERM GOAL #3   Title Pt will demonstrate improved use of VOR as  indicated by a 2-3 line difference on DVA    Baseline 4 line difference (7, 3);  2.5 line difference on 04-02-20    Time 10    Period Weeks    Status Achieved      PT LONG TERM GOAL #4   Title Pt will increase FGA to >/= 23/30    Time 10    Period Weeks    Status Achieved      PT LONG TERM GOAL #5   Title Pt will report being able to perform walking program x 30 minutes, 2-3x/week.    Baseline pt reports she is walking 15" - not 30" to date    Time 10    Period Weeks    Status Partially Met                 Plan - 04/02/20 1255    Clinical Impression Statement Pt has met LTG's #1-4 and partially met LTG #5 as she reports walking 15" (not 30" per stated goal) - pt's DHI has improved from 26% to 16%. Pt states she was diagnosed with Rt hip bursitis last week - pt able to perform stretches for Rt hip with very minimal c/o discomfort and tolerated streteches well.  Pt reports she has been walking for 15' (not 30" per stated LTG) but reports no problems with the walking.  Pt states she is pleased with her current status and is ready for discharge.    Comorbidities RA, depression, cough variant asthma, hypothyroidism, osteomyelitis of R lower mandible    Rehab Potential Good    PT Frequency 2x / week    PT Treatment/Interventions ADLs/Self Care Home Management;Aquatic Therapy;Canalith Repostioning;DME Instruction;Gait training;Stair training;Functional mobility training;Therapeutic activities;Therapeutic exercise;Balance training;Neuromuscular re-education;Patient/family education;Vestibular    PT   Next Visit Plan N/A - D/C on 04-01-20    PT Home Exercise Plan balance HEP - added standing with EO and EC on compliant surface    Consulted and Agree with Plan of Care Patient           Patient will benefit from skilled therapeutic intervention in order to improve the following deficits and impairments:     Visit Diagnosis: Unsteadiness on feet  Difficulty in walking, not elsewhere  classified  Dizziness and giddiness     Problem List Patient Active Problem List   Diagnosis Date Noted  . Cough variant asthma with ? component UACS 12/29/2017  . 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 Rheumatoid arthritis (HCC)  with ? component RA bronchiolitis 03/29/2015  . Anxiety 03/29/2015  . Hypothyroidism 03/29/2015  . Hyperglycemia 03/29/2015     PHYSICAL THERAPY DISCHARGE SUMMARY  Visits from Start of Care: 8  Current functional level related to goals / functional outcomes: See above for progress towards goals - all goals met   Remaining deficits: Pt continues to have difficulty maintaining balance with EC standing on compliant surface - indicative of decreased vestibular input in maintaining balance   Education / Equipment: Pt has been instructed in HEP for balance and vestibular exs. and Rt hip stretches for the bursitis Plan: Patient agrees to discharge.  Patient goals were met. Patient is being discharged due to meeting the stated rehab goals.  ?????         ,  Suzanne, PT 04/02/2020, 1:05 PM  [facility] [address] [place], [place], [address] Phone: [phone]   Fax:  [phone]  Name: Amelda A Muniz MRN: [id number] Date of Birth: 12/26/1945   

## 2020-04-03 ENCOUNTER — Encounter: Payer: Medicare Other | Admitting: Physical Therapy

## 2020-04-08 ENCOUNTER — Encounter: Payer: Medicare Other | Admitting: Physical Therapy

## 2020-04-09 DIAGNOSIS — M810 Age-related osteoporosis without current pathological fracture: Secondary | ICD-10-CM | POA: Diagnosis not present

## 2020-04-10 ENCOUNTER — Encounter: Payer: Medicare Other | Admitting: Physical Therapy

## 2020-04-15 ENCOUNTER — Encounter: Payer: Medicare Other | Admitting: Physical Therapy

## 2020-04-18 ENCOUNTER — Encounter: Payer: Medicare Other | Admitting: Physical Therapy

## 2020-04-22 ENCOUNTER — Encounter: Payer: Medicare Other | Admitting: Physical Therapy

## 2020-04-25 ENCOUNTER — Encounter: Payer: Medicare Other | Admitting: Physical Therapy

## 2020-05-08 DIAGNOSIS — M0589 Other rheumatoid arthritis with rheumatoid factor of multiple sites: Secondary | ICD-10-CM | POA: Diagnosis not present

## 2020-05-27 DIAGNOSIS — Z23 Encounter for immunization: Secondary | ICD-10-CM | POA: Diagnosis not present

## 2020-06-27 DIAGNOSIS — Z79899 Other long term (current) drug therapy: Secondary | ICD-10-CM | POA: Diagnosis not present

## 2020-06-27 DIAGNOSIS — M3500 Sicca syndrome, unspecified: Secondary | ICD-10-CM | POA: Diagnosis not present

## 2020-06-27 DIAGNOSIS — N182 Chronic kidney disease, stage 2 (mild): Secondary | ICD-10-CM | POA: Diagnosis not present

## 2020-06-27 DIAGNOSIS — Z23 Encounter for immunization: Secondary | ICD-10-CM | POA: Diagnosis not present

## 2020-06-27 DIAGNOSIS — M4125 Other idiopathic scoliosis, thoracolumbar region: Secondary | ICD-10-CM | POA: Diagnosis not present

## 2020-06-27 DIAGNOSIS — M150 Primary generalized (osteo)arthritis: Secondary | ICD-10-CM | POA: Diagnosis not present

## 2020-06-27 DIAGNOSIS — M0589 Other rheumatoid arthritis with rheumatoid factor of multiple sites: Secondary | ICD-10-CM | POA: Diagnosis not present

## 2020-06-27 DIAGNOSIS — M810 Age-related osteoporosis without current pathological fracture: Secondary | ICD-10-CM | POA: Diagnosis not present

## 2020-07-03 DIAGNOSIS — M0589 Other rheumatoid arthritis with rheumatoid factor of multiple sites: Secondary | ICD-10-CM | POA: Diagnosis not present

## 2020-07-30 DIAGNOSIS — M0589 Other rheumatoid arthritis with rheumatoid factor of multiple sites: Secondary | ICD-10-CM | POA: Diagnosis not present

## 2020-08-09 DIAGNOSIS — E039 Hypothyroidism, unspecified: Secondary | ICD-10-CM | POA: Diagnosis not present

## 2020-08-09 DIAGNOSIS — Z0001 Encounter for general adult medical examination with abnormal findings: Secondary | ICD-10-CM | POA: Diagnosis not present

## 2020-08-09 DIAGNOSIS — R9431 Abnormal electrocardiogram [ECG] [EKG]: Secondary | ICD-10-CM | POA: Diagnosis not present

## 2020-08-09 DIAGNOSIS — F339 Major depressive disorder, recurrent, unspecified: Secondary | ICD-10-CM | POA: Diagnosis not present

## 2020-08-09 DIAGNOSIS — H9311 Tinnitus, right ear: Secondary | ICD-10-CM | POA: Diagnosis not present

## 2020-08-09 DIAGNOSIS — Z823 Family history of stroke: Secondary | ICD-10-CM | POA: Diagnosis not present

## 2020-08-09 DIAGNOSIS — J309 Allergic rhinitis, unspecified: Secondary | ICD-10-CM | POA: Diagnosis not present

## 2020-08-09 DIAGNOSIS — J45909 Unspecified asthma, uncomplicated: Secondary | ICD-10-CM | POA: Diagnosis not present

## 2020-08-09 DIAGNOSIS — M810 Age-related osteoporosis without current pathological fracture: Secondary | ICD-10-CM | POA: Diagnosis not present

## 2020-08-14 ENCOUNTER — Telehealth: Payer: Self-pay

## 2020-08-14 ENCOUNTER — Other Ambulatory Visit: Payer: Self-pay | Admitting: Internal Medicine

## 2020-08-14 DIAGNOSIS — E039 Hypothyroidism, unspecified: Secondary | ICD-10-CM | POA: Diagnosis not present

## 2020-08-14 DIAGNOSIS — R9431 Abnormal electrocardiogram [ECG] [EKG]: Secondary | ICD-10-CM

## 2020-08-14 NOTE — Telephone Encounter (Signed)
NOTES ON FILE [facility] [phone], SENT REFERRAL TO SCHEDULING 

## 2020-08-16 DIAGNOSIS — R42 Dizziness and giddiness: Secondary | ICD-10-CM | POA: Diagnosis not present

## 2020-08-16 DIAGNOSIS — H9313 Tinnitus, bilateral: Secondary | ICD-10-CM | POA: Diagnosis not present

## 2020-08-16 DIAGNOSIS — H838X3 Other specified diseases of inner ear, bilateral: Secondary | ICD-10-CM | POA: Diagnosis not present

## 2020-08-16 DIAGNOSIS — H903 Sensorineural hearing loss, bilateral: Secondary | ICD-10-CM | POA: Diagnosis not present

## 2020-08-16 DIAGNOSIS — H6983 Other specified disorders of Eustachian tube, bilateral: Secondary | ICD-10-CM | POA: Diagnosis not present

## 2020-08-19 NOTE — Progress Notes (Signed)
Cardiology Office Note:    Date:  08/22/2020   ID:  HONESTII RICHENS, DOB 1945/07/29, MRN [id number]  PCP:  Deland Pretty, MD  [facility] Cardiologist:  Freada Bergeron, MD  [facility] Electrophysiologist:  None   Referring MD: Deland Pretty, MD    History of Present Illness:    [name] is a 75 y.o. female with a hx of depression, RA and hypothyroidism who was referred by Dr. Shelia Media for further evaluation of an abnormal ECG.  Today, the patient states that she has been struggling with a balance issue and has been working with PT with improvement. No exertional chest pain, SOB, palpitations, LE edema, or orthopnea. She is able to do all of her ADLs and walk without issues. Has occasional lightheadedness with balance issues but not exertional in nature. ECG at PCPs office that was obtained at a wellness visit showed NSR with LVH and anteroseptal q-waves. Similar to ECGs dating back to 2012.   Back in 2005, she was diagnosed with pneumonia and triggered "heart failure" at that time. She followed up with Dr. Percival Spanish who recommended she "take a bunch of heart pills" which she did not like. He repeated TTE which reportedly demonstrated LVEF 45% and she was told she no longer needed to take those pills. She did not follow-up regularly with Cardiology after that time.   Past Medical History:  Diagnosis Date  . Arthritis   . Complication of anesthesia   . Cough variant asthma 12/29/2017   12/29/2017  Walked RA x 3 laps @ 185 ft each stopped due to  End of study,fast pace, no sob or desat   Spirometry 12/29/2017  FEV1 1.93 (88%)  Ratio 71 p saba w/in 4 h prior - Allergy profile 12/29/2017 >  Eos 0.6 /  IgE  22 RAST pos  cat > dog and dust  - FENO 12/29/2017  =   36 - 12/29/2017  After extensive coaching inhaler device  effectiveness =    75% > try symbicort 80 2bid - PFT's  01/28/18  FEV1  . Depression   . Hypothyroidism   . Osteomyelitis (HCC)    Mandible, right lower  . PONV  (postoperative nausea and vomiting)     Past Surgical History:  Procedure Laterality Date  . HAND SURGERY Bilateral    Synovial tissue removed  . TOOTH EXTRACTION Right Oct 2016   Then had to have fluid drained from abcess x 2    Current Medications: Current Meds  Medication Sig  . albuterol (VENTOLIN HFA) 108 (90 Base) MCG/ACT inhaler Inhale 2 puffs into the lungs every 6 (six) hours as needed for wheezing or shortness of breath.  . ALPRAZolam (XANAX) 0.5 MG tablet Take 0.5 mg by mouth at bedtime as needed for anxiety.  . Calcium 600-400 MG-UNIT CHEW Chew 2 tablets by mouth daily.  . CEQUA 0.09 % SOLN Apply 1 drop to eye 2 (two) times daily.  . Cetirizine HCl (ZYRTEC ALLERGY) 10 MG CAPS Take 1 capsule by mouth as needed.  . Cholecalciferol (VITAMIN D) 50 MCG (2000 UT) CAPS Take 1 capsule by mouth daily.  . citalopram (CELEXA) 20 MG tablet Take 1 tablet by mouth daily.  Marland Kitchen denosumab (PROLIA) 60 MG/ML SOSY injection 1 Dose by Other route See admin instructions.  . fluconazole (DIFLUCAN) 100 MG tablet Take 1 tablet by mouth daily at 12 noon.  . fluticasone-salmeterol (ADVAIR HFA) 45-21 MCG/ACT inhaler Inhale 2 puffs into the lungs 2 (two)  times daily.  . folic acid (FOLVITE) 371 MCG tablet Take 800 mcg by mouth daily.  Marland Kitchen ibuprofen (ADVIL,MOTRIN) 200 MG tablet Take 400 mg by mouth every 6 (six) hours as needed.   Marland Kitchen levothyroxine (SYNTHROID, LEVOTHROID) 75 MCG tablet Take 75 mcg by mouth daily before breakfast.  . methotrexate 2.5 MG tablet Take 5 tablets by mouth once a week. For 90 days  . montelukast (SINGULAIR) 10 MG tablet TAKE 1 TABLET BY MOUTH EVERYDAY AT BEDTIME  . NONFORMULARY OR COMPOUNDED ITEM every 8 (eight) weeks. [facility] every other month  . [DISCONTINUED] cholecalciferol (VITAMIN D) 1000 units tablet Take 2,000 Units by mouth daily.   . [DISCONTINUED] Melatonin 10 MG CAPS Take 10 mg by mouth at bedtime.   . [DISCONTINUED] Methotrexate 12.5 MG/0.5ML SOSY Inject  into the skin once a week.     Allergies:   Remicade [infliximab]   Social History   Socioeconomic History  . Marital status: Divorced    Spouse name: Not on file  . Number of children: Not on file  . Years of education: Not on file  . Highest education level: Not on file  Occupational History  . Not on file  Tobacco Use  . Smoking status: Never Smoker  . Smokeless tobacco: Never Used  Substance and Sexual Activity  . Alcohol use: No  . Drug use: No  . Sexual activity: Not on file  Other Topics Concern  . Not on file  Social History Narrative  . Not on file   Social Determinants of Health   Financial Resource Strain: Not on file  Food Insecurity: Not on file  Transportation Needs: Not on file  Physical Activity: Not on file  Stress: Not on file  Social Connections: Not on file     Family History: The patient's family history is not on file.  ROS:   Please see the history of present illness.    Review of Systems  Constitutional: Negative for chills and fever.  HENT: Negative for nosebleeds.   Eyes: Negative for blurred vision and redness.  Respiratory: Negative for shortness of breath.   Cardiovascular: Negative for chest pain, palpitations, orthopnea, claudication, leg swelling and PND.  Gastrointestinal: Negative for melena, nausea and vomiting.  Genitourinary: Negative for hematuria.  Musculoskeletal: Positive for joint pain.  Neurological: Negative for dizziness and loss of consciousness.  Endo/Heme/Allergies: Negative for polydipsia.  Psychiatric/Behavioral: Negative for substance abuse.    EKGs/Labs/Other Studies Reviewed:    The following studies were reviewed today: No cardiac studies in our system  EKG:  EKG is  ordered today.  The ekg ordered today demonstrates NSR with anteroseptal q waves, LVH, HR 85  Recent Labs: 08/28/2019: Hemoglobin 13.9; Platelets 240.0  Recent Lipid Panel    Component Value Date/Time   CHOL  10/24/2010 1805    184         ATP III CLASSIFICATION:  <200     mg/dL   Desirable  200-239  mg/dL   Borderline High  >=240    mg/dL   High          TRIG 63 10/24/2010 1805   HDL 63 10/24/2010 1805   CHOLHDL 2.9 10/24/2010 1805   VLDL 13 10/24/2010 1805   LDLCALC (H) 10/24/2010 1805    108        Total Cholesterol/HDL:CHD Risk Coronary Heart Disease Risk Table  Men   Women  1/2 Average Risk   3.4   3.3  Average Risk       5.0   4.4  2 X Average Risk   9.6   7.1  3 X Average Risk  23.4   11.0        Use the calculated Patient Ratio above and the CHD Risk Table to determine the patient's CHD Risk.        ATP III CLASSIFICATION (LDL):  <100     mg/dL   Optimal  100-129  mg/dL   Near or Above                    Optimal  130-159  mg/dL   Borderline  160-189  mg/dL   High  >190     mg/dL   Very High     Physical Exam:    VS:  BP 126/84   Pulse 85   Ht 5\' 3"  (1.6 m)   Wt 135 lb 12.8 oz (61.6 kg)   SpO2 96%   BMI 24.06 kg/m     Wt Readings from Last 3 Encounters:  08/22/20 135 lb 12.8 oz (61.6 kg)  02/26/20 139 lb 14.4 oz (63.5 kg)  08/28/19 137 lb 3.2 oz (62.2 kg)     GEN:  Well nourished, well developed in no acute distress HEENT: Normal NECK: No JVD; No carotid bruits CARDIAC: RRR, no murmurs, rubs, gallops RESPIRATORY:  Clear to auscultation without rales, wheezing or rhonchi  ABDOMEN: Soft, non-tender, non-distended MUSCULOSKELETAL:  No edema; No deformity  SKIN: Warm and dry NEUROLOGIC:  Alert and oriented x 3 PSYCHIATRIC:  Normal affect   ASSESSMENT:    1. Chronic systolic heart failure ([place])   2. Abnormal electrocardiogram   3. Rheumatoid arthritis of hand, unspecified laterality, unspecified whether rheumatoid factor present ([place])   4. Hypothyroidism, unspecified type    PLAN:    In order of problems listed above:  #Anteroseptal Q-waves #Abnormal ECG: Patient noted to have anteroseptal q-waves on ECG obtained during routine visit with PCP. Notably,  this has been present from my review since 2012 on ECGs. Has not had ischemic work-up in the past. In 2005, did have episode of HF when she was diagnosed with pneumonia; EF at that time reportedly 45%. Has not followed regularly with cardiology. No current exertional symptoms, however, given ECG findings, history of HF with mildly reduced EF, will check myoview and TTE for further evaluation. -Check myoview -Check TTE -Planned for coronary calcium score  #History of HF with mildly reduced LVEF 45%: Diagnosed in the setting of pneumonia in 2005. EF at that time per report 45%. Intiially followed with Cardiology but was lost to regular follow-up. No repeat TTE in our system. Currently appears euvolemic on exam. -Repeat TTE as above -Will add medications as needed pending EF  #Rheumatoid arthritis: #Hypothyroidsim: -Management per PCP   Shared Decision Making/Informed Consent The risks [chest pain, shortness of breath, cardiac arrhythmias, dizziness, blood pressure fluctuations, myocardial infarction, stroke/transient ischemic attack, nausea, vomiting, allergic reaction, radiation exposure, metallic taste sensation and life-threatening complications (estimated to be 1 in 10,000)], benefits (risk stratification, diagnosing coronary artery disease, treatment guidance) and alternatives of a nuclear stress test were discussed in detail with Ms. Mcglothlin and she agrees to proceed.       Medication Adjustments/Labs and Tests Ordered: Current medicines are reviewed at length with the patient today.  Concerns regarding medicines are outlined above.  Orders Placed  This Encounter  Procedures  . MYOCARDIAL PERFUSION IMAGING  . EKG 12-Lead  . ECHOCARDIOGRAM COMPLETE   No orders of the defined types were placed in this encounter.   Patient Instructions  Medication Instructions:  Your physician recommends that you continue on your current medications as directed. Please refer to the Current  Medication list given to you today.  *If you need a refill on your cardiac medications before your next appointment, please call your pharmacy*   Lab Work: none If you have labs (blood work) drawn today and your tests are completely normal, you will receive your results only by: Marland Kitchen MyChart Message (if you have MyChart) OR . A paper copy in the mail If you have any lab test that is abnormal or we need to change your treatment, we will call you to review the results.   Testing/Procedures: Your physician has requested that you have an echocardiogram. Echocardiography is a painless test that uses sound waves to create images of your heart. It provides your doctor with information about the size and shape of your heart and how well your heart's chambers and valves are working. This procedure takes approximately one hour. There are no restrictions for this procedure.  Your physician has requested that you have a lexiscan myoview. For further information please visit [email or web address]. Please follow instruction sheet, as given.     Follow-Up: At [facility], you and your health needs are our priority.  As part of our continuing mission to provide you with exceptional heart care, we have created designated Provider Care Teams.  These Care Teams include your primary Cardiologist (physician) and Advanced Practice Providers (APPs -  Physician Assistants and Nurse Practitioners) who all work together to provide you with the care you need, when you need it.  We recommend signing up for the patient portal called "MyChart".  Sign up information is provided on this After Visit Summary.  MyChart is used to connect with patients for Virtual Visits (Telemedicine).  Patients are able to view lab/test results, encounter notes, upcoming appointments, etc.  Non-urgent messages can be sent to your provider as well.   To learn more about what you can do with MyChart, go to [email or web address].    Your next  appointment:   3 month(s)  The format for your next appointment:   In Person  Provider:   You may see Freada Bergeron, MD or one of the following Advanced Practice Providers on your designated Care Team:    Richardson Dopp, PA-C  Robbie Lis, [place]    Other Instructions      Signed, Freada Bergeron, MD  08/22/2020 11:55 AM    [employer]

## 2020-08-22 ENCOUNTER — Other Ambulatory Visit: Payer: Self-pay

## 2020-08-22 ENCOUNTER — Ambulatory Visit (INDEPENDENT_AMBULATORY_CARE_PROVIDER_SITE_OTHER): Payer: Medicare Other | Admitting: Cardiology

## 2020-08-22 ENCOUNTER — Encounter: Payer: Self-pay | Admitting: *Deleted

## 2020-08-22 ENCOUNTER — Encounter: Payer: Self-pay | Admitting: Cardiology

## 2020-08-22 VITALS — BP 126/84 | HR 85 | Ht 63.0 in | Wt 135.8 lb

## 2020-08-22 DIAGNOSIS — R9431 Abnormal electrocardiogram [ECG] [EKG]: Secondary | ICD-10-CM | POA: Diagnosis not present

## 2020-08-22 DIAGNOSIS — M069 Rheumatoid arthritis, unspecified: Secondary | ICD-10-CM | POA: Diagnosis not present

## 2020-08-22 DIAGNOSIS — E039 Hypothyroidism, unspecified: Secondary | ICD-10-CM

## 2020-08-22 DIAGNOSIS — I5022 Chronic systolic (congestive) heart failure: Secondary | ICD-10-CM | POA: Diagnosis not present

## 2020-08-22 NOTE — Patient Instructions (Signed)
Medication Instructions:  Your physician recommends that you continue on your current medications as directed. Please refer to the Current Medication list given to you today.  *If you need a refill on your cardiac medications before your next appointment, please call your pharmacy*   Lab Work: none If you have labs (blood work) drawn today and your tests are completely normal, you will receive your results only by: Marland Kitchen MyChart Message (if you have MyChart) OR . A paper copy in the mail If you have any lab test that is abnormal or we need to change your treatment, we will call you to review the results.   Testing/Procedures: Your physician has requested that you have an echocardiogram. Echocardiography is a painless test that uses sound waves to create images of your heart. It provides your doctor with information about the size and shape of your heart and how well your heart's chambers and valves are working. This procedure takes approximately one hour. There are no restrictions for this procedure.  Your physician has requested that you have a lexiscan myoview. For further information please visit [email or web address]. Please follow instruction sheet, as given.     Follow-Up: At [facility], you and your health needs are our priority.  As part of our continuing mission to provide you with exceptional heart care, we have created designated Provider Care Teams.  These Care Teams include your primary Cardiologist (physician) and Advanced Practice Providers (APPs -  Physician Assistants and Nurse Practitioners) who all work together to provide you with the care you need, when you need it.  We recommend signing up for the patient portal called "MyChart".  Sign up information is provided on this After Visit Summary.  MyChart is used to connect with patients for Virtual Visits (Telemedicine).  Patients are able to view lab/test results, encounter notes, upcoming appointments, etc.  Non-urgent  messages can be sent to your provider as well.   To learn more about what you can do with MyChart, go to [email or web address].    Your next appointment:   3 month(s)  The format for your next appointment:   In Person  Provider:   You may see Freada Bergeron, MD or one of the following Advanced Practice Providers on your designated Care Team:    Richardson Dopp, PA-C  Robbie Lis, [place]    Other Instructions

## 2020-08-30 ENCOUNTER — Ambulatory Visit
Admission: RE | Admit: 2020-08-30 | Discharge: 2020-08-30 | Disposition: A | Discharge status: Home / Self Care | Payer: No Typology Code available for payment source | Source: Ambulatory Visit | Attending: Internal Medicine | Admitting: Internal Medicine

## 2020-08-30 DIAGNOSIS — R9431 Abnormal electrocardiogram [ECG] [EKG]: Secondary | ICD-10-CM

## 2020-09-04 ENCOUNTER — Telehealth (HOSPITAL_COMMUNITY): Payer: Self-pay | Admitting: Cardiology

## 2020-09-04 NOTE — Telephone Encounter (Signed)
Patient called an cancelled echo and Myoview with the Nuclear tech and did not reschedule at this time. Order will be removed from the [facility] and if patient calls back to reschedule we will reinstate the order . Thank you.

## 2020-09-09 DIAGNOSIS — I129 Hypertensive chronic kidney disease with stage 1 through stage 4 chronic kidney disease, or unspecified chronic kidney disease: Secondary | ICD-10-CM | POA: Diagnosis not present

## 2020-09-09 DIAGNOSIS — E039 Hypothyroidism, unspecified: Secondary | ICD-10-CM | POA: Diagnosis not present

## 2020-09-09 DIAGNOSIS — N182 Chronic kidney disease, stage 2 (mild): Secondary | ICD-10-CM | POA: Diagnosis not present

## 2020-09-09 DIAGNOSIS — M810 Age-related osteoporosis without current pathological fracture: Secondary | ICD-10-CM | POA: Diagnosis not present

## 2020-09-13 ENCOUNTER — Other Ambulatory Visit (HOSPITAL_COMMUNITY): Payer: Medicare Other

## 2020-09-13 ENCOUNTER — Encounter (HOSPITAL_COMMUNITY): Payer: Medicare Other

## 2020-10-10 DIAGNOSIS — M810 Age-related osteoporosis without current pathological fracture: Secondary | ICD-10-CM | POA: Diagnosis not present

## 2020-10-15 DIAGNOSIS — B370 Candidal stomatitis: Secondary | ICD-10-CM | POA: Diagnosis not present

## 2020-10-15 DIAGNOSIS — R682 Dry mouth, unspecified: Secondary | ICD-10-CM | POA: Diagnosis not present

## 2020-10-15 DIAGNOSIS — L309 Dermatitis, unspecified: Secondary | ICD-10-CM | POA: Diagnosis not present

## 2020-10-28 DIAGNOSIS — L249 Irritant contact dermatitis, unspecified cause: Secondary | ICD-10-CM | POA: Diagnosis not present

## 2020-10-28 DIAGNOSIS — L308 Other specified dermatitis: Secondary | ICD-10-CM | POA: Diagnosis not present

## 2020-11-06 DIAGNOSIS — N182 Chronic kidney disease, stage 2 (mild): Secondary | ICD-10-CM | POA: Diagnosis not present

## 2020-11-06 DIAGNOSIS — I2510 Atherosclerotic heart disease of native coronary artery without angina pectoris: Secondary | ICD-10-CM | POA: Diagnosis not present

## 2020-11-06 DIAGNOSIS — M150 Primary generalized (osteo)arthritis: Secondary | ICD-10-CM | POA: Diagnosis not present

## 2020-11-06 DIAGNOSIS — Z79899 Other long term (current) drug therapy: Secondary | ICD-10-CM | POA: Diagnosis not present

## 2020-11-06 DIAGNOSIS — M3500 Sicca syndrome, unspecified: Secondary | ICD-10-CM | POA: Diagnosis not present

## 2020-11-06 DIAGNOSIS — M4125 Other idiopathic scoliosis, thoracolumbar region: Secondary | ICD-10-CM | POA: Diagnosis not present

## 2020-11-06 DIAGNOSIS — I2584 Coronary atherosclerosis due to calcified coronary lesion: Secondary | ICD-10-CM | POA: Diagnosis not present

## 2020-11-06 DIAGNOSIS — M810 Age-related osteoporosis without current pathological fracture: Secondary | ICD-10-CM | POA: Diagnosis not present

## 2020-11-06 DIAGNOSIS — M0589 Other rheumatoid arthritis with rheumatoid factor of multiple sites: Secondary | ICD-10-CM | POA: Diagnosis not present

## 2020-11-09 DIAGNOSIS — E039 Hypothyroidism, unspecified: Secondary | ICD-10-CM | POA: Diagnosis not present

## 2020-11-09 DIAGNOSIS — N182 Chronic kidney disease, stage 2 (mild): Secondary | ICD-10-CM | POA: Diagnosis not present

## 2020-11-09 DIAGNOSIS — I129 Hypertensive chronic kidney disease with stage 1 through stage 4 chronic kidney disease, or unspecified chronic kidney disease: Secondary | ICD-10-CM | POA: Diagnosis not present

## 2020-11-09 DIAGNOSIS — M810 Age-related osteoporosis without current pathological fracture: Secondary | ICD-10-CM | POA: Diagnosis not present

## 2020-11-18 DIAGNOSIS — L308 Other specified dermatitis: Secondary | ICD-10-CM | POA: Diagnosis not present

## 2020-11-18 DIAGNOSIS — L249 Irritant contact dermatitis, unspecified cause: Secondary | ICD-10-CM | POA: Diagnosis not present

## 2020-11-21 ENCOUNTER — Ambulatory Visit: Payer: Medicare Other | Admitting: Cardiology

## 2020-11-25 ENCOUNTER — Other Ambulatory Visit: Payer: Self-pay | Admitting: Primary Care

## 2020-11-28 DIAGNOSIS — M4125 Other idiopathic scoliosis, thoracolumbar region: Secondary | ICD-10-CM | POA: Diagnosis not present

## 2020-11-28 DIAGNOSIS — Z79899 Other long term (current) drug therapy: Secondary | ICD-10-CM | POA: Diagnosis not present

## 2020-11-28 DIAGNOSIS — M810 Age-related osteoporosis without current pathological fracture: Secondary | ICD-10-CM | POA: Diagnosis not present

## 2020-11-28 DIAGNOSIS — N182 Chronic kidney disease, stage 2 (mild): Secondary | ICD-10-CM | POA: Diagnosis not present

## 2020-11-28 DIAGNOSIS — M3500 Sicca syndrome, unspecified: Secondary | ICD-10-CM | POA: Diagnosis not present

## 2020-11-28 DIAGNOSIS — M0589 Other rheumatoid arthritis with rheumatoid factor of multiple sites: Secondary | ICD-10-CM | POA: Diagnosis not present

## 2020-11-28 DIAGNOSIS — M150 Primary generalized (osteo)arthritis: Secondary | ICD-10-CM | POA: Diagnosis not present

## 2020-12-02 DIAGNOSIS — M0589 Other rheumatoid arthritis with rheumatoid factor of multiple sites: Secondary | ICD-10-CM | POA: Diagnosis not present

## 2020-12-18 DIAGNOSIS — I10 Essential (primary) hypertension: Secondary | ICD-10-CM | POA: Diagnosis not present

## 2021-01-09 DIAGNOSIS — E039 Hypothyroidism, unspecified: Secondary | ICD-10-CM | POA: Diagnosis not present

## 2021-01-09 DIAGNOSIS — I129 Hypertensive chronic kidney disease with stage 1 through stage 4 chronic kidney disease, or unspecified chronic kidney disease: Secondary | ICD-10-CM | POA: Diagnosis not present

## 2021-01-09 DIAGNOSIS — N182 Chronic kidney disease, stage 2 (mild): Secondary | ICD-10-CM | POA: Diagnosis not present

## 2021-01-09 DIAGNOSIS — M810 Age-related osteoporosis without current pathological fracture: Secondary | ICD-10-CM | POA: Diagnosis not present

## 2021-01-23 DIAGNOSIS — M0589 Other rheumatoid arthritis with rheumatoid factor of multiple sites: Secondary | ICD-10-CM | POA: Diagnosis not present

## 2021-01-27 DIAGNOSIS — M0589 Other rheumatoid arthritis with rheumatoid factor of multiple sites: Secondary | ICD-10-CM | POA: Diagnosis not present

## 2021-02-03 ENCOUNTER — Other Ambulatory Visit: Payer: Self-pay | Admitting: Internal Medicine

## 2021-02-18 DIAGNOSIS — M7070 Other bursitis of hip, unspecified hip: Secondary | ICD-10-CM | POA: Diagnosis not present

## 2021-02-18 DIAGNOSIS — M810 Age-related osteoporosis without current pathological fracture: Secondary | ICD-10-CM | POA: Diagnosis not present

## 2021-02-18 DIAGNOSIS — M150 Primary generalized (osteo)arthritis: Secondary | ICD-10-CM | POA: Diagnosis not present

## 2021-02-18 DIAGNOSIS — Z79899 Other long term (current) drug therapy: Secondary | ICD-10-CM | POA: Diagnosis not present

## 2021-02-18 DIAGNOSIS — M0589 Other rheumatoid arthritis with rheumatoid factor of multiple sites: Secondary | ICD-10-CM | POA: Diagnosis not present

## 2021-02-18 DIAGNOSIS — M4125 Other idiopathic scoliosis, thoracolumbar region: Secondary | ICD-10-CM | POA: Diagnosis not present

## 2021-02-18 DIAGNOSIS — M3500 Sicca syndrome, unspecified: Secondary | ICD-10-CM | POA: Diagnosis not present

## 2021-02-18 DIAGNOSIS — M25551 Pain in right hip: Secondary | ICD-10-CM | POA: Diagnosis not present

## 2021-02-18 DIAGNOSIS — N182 Chronic kidney disease, stage 2 (mild): Secondary | ICD-10-CM | POA: Diagnosis not present

## 2021-02-25 ENCOUNTER — Ambulatory Visit: Payer: Medicare Other | Admitting: Internal Medicine

## 2021-02-28 ENCOUNTER — Encounter: Payer: Self-pay | Admitting: Internal Medicine

## 2021-02-28 ENCOUNTER — Other Ambulatory Visit: Payer: Self-pay

## 2021-02-28 ENCOUNTER — Ambulatory Visit (INDEPENDENT_AMBULATORY_CARE_PROVIDER_SITE_OTHER): Payer: Medicare Other

## 2021-02-28 ENCOUNTER — Ambulatory Visit (INDEPENDENT_AMBULATORY_CARE_PROVIDER_SITE_OTHER): Payer: Medicare Other | Admitting: Internal Medicine

## 2021-02-28 DIAGNOSIS — J45991 Cough variant asthma: Secondary | ICD-10-CM | POA: Diagnosis not present

## 2021-02-28 DIAGNOSIS — J479 Bronchiectasis, uncomplicated: Secondary | ICD-10-CM | POA: Diagnosis not present

## 2021-02-28 DIAGNOSIS — M069 Rheumatoid arthritis, unspecified: Secondary | ICD-10-CM | POA: Diagnosis not present

## 2021-02-28 DIAGNOSIS — J45909 Unspecified asthma, uncomplicated: Secondary | ICD-10-CM | POA: Diagnosis not present

## 2021-02-28 DIAGNOSIS — R059 Cough, unspecified: Secondary | ICD-10-CM | POA: Diagnosis not present

## 2021-02-28 NOTE — Patient Instructions (Signed)
Try off advair and singulair to see if any change in your symtpoms   Only use your albuterol as a rescue medication to be used if you can't catch your breath by resting or doing a relaxed purse lip breathing pattern.  - The less you use it, the better it will work when you need it. - Ok to use up to 2 puffs  every 4 hours if you must but   immediately go back on advair/ singulair    Please remember to go to the  x-ray department  for your tests - we will call you with the results when they are available   Please schedule a follow up visit in 12 months but call sooner if needed

## 2021-02-28 NOTE — Progress Notes (Signed)
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 and around the same time noted spring time "allergies" = nasal congestion / itchy eyes which overall has worsened over the years and more year round then new assoc cough started around 2000 but just put it up with it s specialty eval and started on advair in the early 2000s by Dr Sandi Mariscal did not help and started using albuterol which seemed to help some and taking more regularly since early June   2019 and referred to pulmonary clinic 12/29/2017 by Dr Pharr/ Titus Mould      History of Present Illness  12/29/2017 1st [facility] office visit/ Hiilei Gerst   Chief Complaint  Patient presents with   Pulmonary Consult    Referred by Dr. Deland Pretty. Pt c/o cough every spring allergy season for as long as she can remember. She states cough is mainly non prod but occ will produce some clear sputum. She has some PND.  She sometimes gets SOB when doing yard work or walking her dog at a fast pace. She uses her albuterol inhaler 2 x daily on average.   cough onset end of February 2019 worse ever since , mostly non prod, worse with laughing and after supper but not noct  Doe = MMRC1 = can walk nl pace, flat grade, can't hurry or go uphills or steps s sob   Says arthritis doing generally better while breathing worse  rec Prednisone 10 mg take  4 each am x 2 days,   2 each am x 2 days,  1 each am x 2 days and stop  Plan A = Automatic = symbicort 80 Take 2 puffs first thing in am and then another 2 puffs about 12 hours later.  Work on inhaler technique:  relax and gently blow all the way out then take a nice smooth deep breath back in, triggering the inhaler at same time you start breathing in.   Plan B = Backup Only use your albuterol as a rescue medication to be used if you can't catch your breath    NP  02/07/18  rx Symb > Breo due to insurance    05/02/2018  f/u ov/Shayda Kalka re:  Cough variant asthma /  ? uacs ? RA lung dz  Chief Complaint  Patient presents with   Follow-up    States her breathing has been at her basline, cough has gotten better.   Dyspnea:  MMRC1 = can walk nl pace, flat grade, can't hurry or go uphills or steps s sob   Cough:  recurrent daytime  on Breo vs was completely gone while on symbicort/ cough with laughing  Sleeping: able to lie one pillow  SABA use: never  rec symb 80 or dulera 100 2 bid  > changed to BREO 100 by insurance but even it has high deductible    02/21/2019  f/u ov/Asencion Guisinger re: RA / chronic asthma   - off mtx  Chief Complaint  Patient presents with   Follow-up    Pt states she is now using Breo but doesnt use it consistently due to monthly cost. Pt states she maybe uses it once or twice a week but does notice an improvement in her breathing when she does use it. Pt states she rarely uses her  albuterol. Pt c/o prod cough with white mucus but states this is baseline for her. Pt denies CP/tightness and f/c/s.   Dyspnea:  Better p Breo Cough: hoarse / cough day > noct since using breo 100 regularly > mini white mucus production Sleeping: on side one pillow ok  SABA use: rarely 02: none  rec Plan A = Automatic = Dulera 100 mg Take  Up to 1- 2 puffs  Every 12 hours  Work on inhaler technique:    Plan B = Backup Only use your albuterol (ventolin)  Inhaler  If not happy with dulera 100 return to clinic with your drug formulary in hand (ok to substitute advair hfa    NP eval rec add singulair  08/28/19   alpha-1 was normal (genetic marker for COPD/emphysema). IgE was normal, Eosinophils were elevated at 600 which could be consistent with allergic asthma. Continue Advair and singulair and fu with Dr. Melvyn Novas in August    02/26/2020  f/u ov/Trumaine Wimer re: chronic asthma / flare since 02/14/20 improving  Chief Complaint  Patient presents with   Follow-up    pt  states coughing up yellow mucus.pt used advair inhaler for syns  Dyspnea:  As flat in ac can walk s limited sob/ some balance issues seeing ent  Cough: x 12 days/ improving s rx / tends to cough in am  Sleeping: no resp symptoms  SABA use: not all advair is 70 dollars per month 02: none  Rec Work on inhaler technique:    Check your drug formulary to see if there's something cheaper than Advair  Please schedule a follow up visit in 12 months but call sooner if needed if you want to see an allergist       02/28/2021  f/u ov/Shallyn Constancio re: asthma    maint on advair 45  1-2 up to twice  daily and singulair Chief Complaint  Patient presents with   Follow-up    Patient reports she is here for follow up on cough,    Dyspnea:  more limited by balance than breathing / walks dog twice daily no hills  Cough: first thing in am, couple of coughs clears min mucoid sputum and minimal the rest of the day  Sleeping: flat bed, one pillow  SABA use: none  02: none  Covid status:   vax x 3    No obvious day to day or daytime variability or assoc  purulent sputum or mucus plugs or hemoptysis or cp or chest tightness, subjective wheeze or overt sinus or hb symptoms.   Sleeping  without nocturnal  or early am exacerbation  of respiratory  c/o's or need for noct saba. Also denies any obvious fluctuation of symptoms with weather or environmental changes or other aggravating or alleviating factors except as outlined above   No unusual exposure hx or h/o childhood pna/ asthma or knowledge of premature birth.  Current Allergies, Complete Past Medical History, Past Surgical History, Family History, and Social History were reviewed in [employer] record.  ROS  The following are not active complaints unless bolded Hoarseness, sore throat, dysphagia, dental problems, itching, sneezing,  nasal congestion or discharge of excess mucus or purulent secretions, ear ache,   fever, chills, sweats,  unintended wt loss or wt gain, classically pleuritic or exertional cp,  orthopnea pnd or arm/hand swelling  or leg swelling, presyncope, palpitations, abdominal pain, anorexia, nausea, vomiting, diarrhea  or change in bowel habits or change in bladder habits, change in stools  or change in urine, dysuria, hematuria,  rash, arthralgias, visual complaints, headache, numbness, weakness or ataxia or problems with walking or coordination,  change in mood or  memory.        Current Meds  Medication Sig   albuterol (VENTOLIN HFA) 108 (90 Base) MCG/ACT inhaler Inhale 2 puffs into the lungs every 6 (six) hours as needed for wheezing or shortness of breath.   ALPRAZolam (XANAX) 0.5 MG tablet Take 0.5 mg by mouth at bedtime as needed for anxiety.   Calcium 600-400 MG-UNIT CHEW Chew 2 tablets by mouth daily.   Cetirizine HCl (ZYRTEC ALLERGY) 10 MG CAPS Take 1 capsule by mouth as needed.   Cholecalciferol (VITAMIN D) 50 MCG (2000 UT) CAPS Take 1 capsule by mouth daily.   citalopram (CELEXA) 20 MG tablet Take 1 tablet by mouth daily.   denosumab (PROLIA) 60 MG/ML SOSY injection 1 Dose by Other route See admin instructions.   fluticasone-salmeterol (ADVAIR HFA) 45-21 MCG/ACT inhaler Inhale 2 puffs into the lungs 2 (two) times daily.   folic acid (FOLVITE) Q000111Q MCG tablet Take 800 mcg by mouth daily.   ibuprofen (ADVIL,MOTRIN) 200 MG tablet Take 400 mg by mouth every 6 (six) hours as needed.    levothyroxine (SYNTHROID, LEVOTHROID) 75 MCG tablet Take 75 mcg by mouth daily before breakfast.   methotrexate 2.5 MG tablet Take 5 tablets by mouth once a week. For 90 days   montelukast (SINGULAIR) 10 MG tablet TAKE 1 TABLET BY MOUTH EVERYDAY AT BEDTIME   NONFORMULARY OR COMPOUNDED ITEM every 8 (eight) weeks. [facility] every other month   rosuvastatin (CRESTOR) 5 MG tablet Take 5 mg by mouth every other day.               Objective:   Physical Exam     02/28/2021         135  02/26/2020          139 02/21/2019        144 05/02/2018      140   12/29/17 135 lb (61.2 kg)  09/27/16 130 lb (59 kg)  08/27/15 122 lb (55.3 kg)     Vital signs reviewed  02/28/2021  - Note at rest 02 sats  93% on RA   General appearance:    amb hoarse wf nad   HEENT : pt wearing mask not removed for exam due to covid -19 concerns.    NECK :  without JVD/Nodes/TM/ nl carotid upstrokes bilaterally   LUNGS: no acc muscle use,  Nl contour chest which is clear to A and P bilaterally without cough on insp or exp maneuvers   CV:  RRR  no s3 or murmur or increase in P2, and no edema   ABD:  soft and nontender with nl inspiratory excursion in the supine position. No bruits or organomegaly appreciated, bowel sounds nl  MS:  Nl gait/ ext warm without deformities, calf tenderness, cyanosis or clubbing No obvious joint restrictions   SKIN: warm and dry without lesions    NEURO:  alert, approp, nl sensorium with  no motor or cerebellar deficits apparent.           CXR PA and Lateral:   02/28/2021 :    I personally reviewed images and agree with radiology impression as follows:     Mild bronchial thickening and hyperinflation can be seen with asthma or bronchitis. Bronchiectasis on prior cardiac CT is not well demonstrated by radiograph.  Assessment:

## 2021-03-01 NOTE — Assessment & Plan Note (Addendum)
Never smoker 12/29/2017  Walked RA x 3 laps @ 185 ft each stopped due to  End of study,fast pace, no sob or desat   Spirometry 12/29/2017  FEV1 1.93 (88%)  Ratio 71 p saba w/in 4 h prior - Allergy profile 12/29/2017 >  Eos 0.6 /  IgE  22 RAST pos  cat > dog and dust  - FENO 12/29/2017  =   36 - 12/29/2017  After extensive coaching inhaler device  effectiveness =    75% > try symbicort 80 2bid - PFT's  01/28/18  FEV1 1.53 (68 % ) ratio 62  p 0 % improvement from saba p ?  prior to study with DLCO 68/67c% corrects to 88 % for alv volume  - changed to breo 02/07/18 due to insurance but cough recurred  - 05/02/2018  After extensive coaching inhaler device,  effectiveness =    90% with hfa > resume symbicort  - PFT's  02/21/2019  FEV1 1.61 (74 % ) ratio 0.69  p 11 % improvement from saba p 0 prior to study with DLCO  14.80 (78%) corrects to 3.59 (86%)  for alv volume and FV curve mild convexity    She is not convinced singulair/ advair helping at this point and neither am I   Advised trial off looking for worse cough /doe over next 1-2 week and if so restart.         Each maintenance medication was reviewed in detail including emphasizing most importantly the difference between maintenance and prns and under what circumstances the prns are to be triggered using an action plan format where appropriate.  Total time for H and P, chart review, counseling, reviewing optional hfa  device(s) and generating customized AVS unique to this office visit / same day charting = 22 min

## 2021-03-01 NOTE — Assessment & Plan Note (Signed)
Onset of RA in her 70s - PFT's  01/28/18  FEV1 1.53 (68 % ) ratio 62  p 0 % improvement from saba p ?  prior to study with DLCO 68/67c% corrects to 88 % for alv volume  ? C/w RA bronchiolitis?  - PFTs 02/21/2019 improved airflow, slt decrease in DLCO off mtx at time of study   No clinical or radiographic progression, key to prognosis is to control the underlying problem = systemic RA

## 2021-03-03 ENCOUNTER — Encounter: Payer: Self-pay | Admitting: *Deleted

## 2021-03-03 ENCOUNTER — Encounter: Payer: Self-pay | Admitting: Internal Medicine

## 2021-03-24 DIAGNOSIS — M0589 Other rheumatoid arthritis with rheumatoid factor of multiple sites: Secondary | ICD-10-CM | POA: Diagnosis not present

## 2021-04-16 DIAGNOSIS — H16223 Keratoconjunctivitis sicca, not specified as Sjogren's, bilateral: Secondary | ICD-10-CM | POA: Diagnosis not present

## 2021-04-16 DIAGNOSIS — M810 Age-related osteoporosis without current pathological fracture: Secondary | ICD-10-CM | POA: Diagnosis not present

## 2021-04-16 DIAGNOSIS — H43812 Vitreous degeneration, left eye: Secondary | ICD-10-CM | POA: Diagnosis not present

## 2021-04-16 DIAGNOSIS — Z961 Presence of intraocular lens: Secondary | ICD-10-CM | POA: Diagnosis not present

## 2021-04-16 DIAGNOSIS — H31003 Unspecified chorioretinal scars, bilateral: Secondary | ICD-10-CM | POA: Diagnosis not present

## 2021-04-16 DIAGNOSIS — H35343 Macular cyst, hole, or pseudohole, bilateral: Secondary | ICD-10-CM | POA: Diagnosis not present

## 2021-04-16 DIAGNOSIS — Z9841 Cataract extraction status, right eye: Secondary | ICD-10-CM | POA: Diagnosis not present

## 2021-04-23 DIAGNOSIS — Z79899 Other long term (current) drug therapy: Secondary | ICD-10-CM | POA: Diagnosis not present

## 2021-04-23 DIAGNOSIS — M150 Primary generalized (osteo)arthritis: Secondary | ICD-10-CM | POA: Diagnosis not present

## 2021-04-23 DIAGNOSIS — M25551 Pain in right hip: Secondary | ICD-10-CM | POA: Diagnosis not present

## 2021-04-23 DIAGNOSIS — M810 Age-related osteoporosis without current pathological fracture: Secondary | ICD-10-CM | POA: Diagnosis not present

## 2021-04-23 DIAGNOSIS — M549 Dorsalgia, unspecified: Secondary | ICD-10-CM | POA: Diagnosis not present

## 2021-04-23 DIAGNOSIS — M3500 Sicca syndrome, unspecified: Secondary | ICD-10-CM | POA: Diagnosis not present

## 2021-04-23 DIAGNOSIS — M0589 Other rheumatoid arthritis with rheumatoid factor of multiple sites: Secondary | ICD-10-CM | POA: Diagnosis not present

## 2021-04-23 DIAGNOSIS — M4125 Other idiopathic scoliosis, thoracolumbar region: Secondary | ICD-10-CM | POA: Diagnosis not present

## 2021-04-23 DIAGNOSIS — N182 Chronic kidney disease, stage 2 (mild): Secondary | ICD-10-CM | POA: Diagnosis not present

## 2021-05-03 ENCOUNTER — Other Ambulatory Visit: Payer: Self-pay | Admitting: Internal Medicine

## 2021-05-19 DIAGNOSIS — M0589 Other rheumatoid arthritis with rheumatoid factor of multiple sites: Secondary | ICD-10-CM | POA: Diagnosis not present

## 2021-06-09 ENCOUNTER — Other Ambulatory Visit: Payer: Self-pay

## 2021-06-09 ENCOUNTER — Encounter (HOSPITAL_BASED_OUTPATIENT_CLINIC_OR_DEPARTMENT_OTHER): Payer: Self-pay | Admitting: *Deleted

## 2021-06-09 ENCOUNTER — Emergency Department (HOSPITAL_BASED_OUTPATIENT_CLINIC_OR_DEPARTMENT_OTHER): Payer: Medicare Other

## 2021-06-09 ENCOUNTER — Inpatient Hospital Stay (HOSPITAL_BASED_OUTPATIENT_CLINIC_OR_DEPARTMENT_OTHER)
Admission: EM | Admit: 2021-06-09 | Discharge: 2021-06-16 | DRG: 872 | Disposition: A | Discharge status: Home / Self Care | Payer: Medicare Other | Attending: Internal Medicine | Admitting: Internal Medicine

## 2021-06-09 DIAGNOSIS — K5792 Diverticulitis of intestine, part unspecified, without perforation or abscess without bleeding: Secondary | ICD-10-CM | POA: Diagnosis present

## 2021-06-09 DIAGNOSIS — E039 Hypothyroidism, unspecified: Secondary | ICD-10-CM | POA: Diagnosis not present

## 2021-06-09 DIAGNOSIS — M1990 Unspecified osteoarthritis, unspecified site: Secondary | ICD-10-CM | POA: Diagnosis present

## 2021-06-09 DIAGNOSIS — F32A Depression, unspecified: Secondary | ICD-10-CM | POA: Diagnosis present

## 2021-06-09 DIAGNOSIS — A419 Sepsis, unspecified organism: Principal | ICD-10-CM | POA: Diagnosis present

## 2021-06-09 DIAGNOSIS — R109 Unspecified abdominal pain: Secondary | ICD-10-CM | POA: Diagnosis not present

## 2021-06-09 DIAGNOSIS — R1084 Generalized abdominal pain: Secondary | ICD-10-CM | POA: Diagnosis not present

## 2021-06-09 DIAGNOSIS — K5732 Diverticulitis of large intestine without perforation or abscess without bleeding: Secondary | ICD-10-CM | POA: Diagnosis not present

## 2021-06-09 DIAGNOSIS — F419 Anxiety disorder, unspecified: Secondary | ICD-10-CM | POA: Diagnosis present

## 2021-06-09 DIAGNOSIS — R9431 Abnormal electrocardiogram [ECG] [EKG]: Secondary | ICD-10-CM | POA: Diagnosis not present

## 2021-06-09 DIAGNOSIS — Z888 Allergy status to other drugs, medicaments and biological substances status: Secondary | ICD-10-CM

## 2021-06-09 DIAGNOSIS — Z79899 Other long term (current) drug therapy: Secondary | ICD-10-CM

## 2021-06-09 DIAGNOSIS — Z20822 Contact with and (suspected) exposure to covid-19: Secondary | ICD-10-CM | POA: Diagnosis present

## 2021-06-09 DIAGNOSIS — Z79890 Hormone replacement therapy (postmenopausal): Secondary | ICD-10-CM

## 2021-06-09 DIAGNOSIS — R110 Nausea: Secondary | ICD-10-CM | POA: Diagnosis not present

## 2021-06-09 DIAGNOSIS — R1110 Vomiting, unspecified: Secondary | ICD-10-CM | POA: Diagnosis not present

## 2021-06-09 DIAGNOSIS — M069 Rheumatoid arthritis, unspecified: Secondary | ICD-10-CM | POA: Diagnosis not present

## 2021-06-09 DIAGNOSIS — Z87891 Personal history of nicotine dependence: Secondary | ICD-10-CM

## 2021-06-09 DIAGNOSIS — I10 Essential (primary) hypertension: Secondary | ICD-10-CM | POA: Diagnosis not present

## 2021-06-09 DIAGNOSIS — J45991 Cough variant asthma: Secondary | ICD-10-CM | POA: Diagnosis present

## 2021-06-09 DIAGNOSIS — E785 Hyperlipidemia, unspecified: Secondary | ICD-10-CM | POA: Diagnosis present

## 2021-06-09 DIAGNOSIS — M869 Osteomyelitis, unspecified: Secondary | ICD-10-CM | POA: Diagnosis present

## 2021-06-09 LAB — CBC WITH DIFFERENTIAL/PLATELET
Abs Immature Granulocytes: 0.05 10*3/uL (ref 0.00–0.07)
Basophils Absolute: 0.1 10*3/uL (ref 0.0–0.1)
Basophils Relative: 1 %
Eosinophils Absolute: 0 10*3/uL (ref 0.0–0.5)
Eosinophils Relative: 0 %
HCT: 38 % (ref 36.0–46.0)
Hemoglobin: 12.2 g/dL (ref 12.0–15.0)
Immature Granulocytes: 0 %
Lymphocytes Relative: 10 %
Lymphs Abs: 1.5 10*3/uL (ref 0.7–4.0)
MCH: 26.9 pg (ref 26.0–34.0)
MCHC: 32.1 g/dL (ref 30.0–36.0)
MCV: 83.9 fL (ref 80.0–100.0)
Monocytes Absolute: 0.6 10*3/uL (ref 0.1–1.0)
Monocytes Relative: 4 %
Neutro Abs: 12.5 10*3/uL — ABNORMAL HIGH (ref 1.7–7.7)
Neutrophils Relative %: 85 %
Platelets: 332 10*3/uL (ref 150–400)
RBC: 4.53 MIL/uL (ref 3.87–5.11)
RDW: 15.9 % — ABNORMAL HIGH (ref 11.5–15.5)
WBC: 14.7 10*3/uL — ABNORMAL HIGH (ref 4.0–10.5)
nRBC: 0 % (ref 0.0–0.2)

## 2021-06-09 LAB — URINALYSIS, ROUTINE W REFLEX MICROSCOPIC
Bilirubin Urine: NEGATIVE
Glucose, UA: NEGATIVE mg/dL
Hgb urine dipstick: NEGATIVE
Ketones, ur: 40 mg/dL — AB
Leukocytes,Ua: NEGATIVE
Nitrite: NEGATIVE
Specific Gravity, Urine: 1.039 — ABNORMAL HIGH (ref 1.005–1.030)
pH: 8 (ref 5.0–8.0)

## 2021-06-09 LAB — COMPREHENSIVE METABOLIC PANEL
ALT: 11 U/L (ref 0–44)
AST: 14 U/L — ABNORMAL LOW (ref 15–41)
Albumin: 4.6 g/dL (ref 3.5–5.0)
Alkaline Phosphatase: 45 U/L (ref 38–126)
Anion gap: 14 (ref 5–15)
BUN: 18 mg/dL (ref 8–23)
CO2: 21 mmol/L — ABNORMAL LOW (ref 22–32)
Calcium: 9.3 mg/dL (ref 8.9–10.3)
Chloride: 104 mmol/L (ref 98–111)
Creatinine, Ser: 0.7 mg/dL (ref 0.44–1.00)
GFR, Estimated: >60 mL/min (ref >60)
Glucose, Bld: 147 mg/dL — ABNORMAL HIGH (ref 70–99)
Potassium: 3.7 mmol/L (ref 3.5–5.1)
Sodium: 139 mmol/L (ref 135–145)
Total Bilirubin: 1 mg/dL (ref 0.3–1.2)
Total Protein: 7.2 g/dL (ref 6.5–8.1)

## 2021-06-09 LAB — RESP PANEL BY RT-PCR (FLU A&B, COVID) ARPGX2
Influenza A by PCR: NEGATIVE
Influenza B by PCR: NEGATIVE
SARS Coronavirus 2 by RT PCR: NEGATIVE

## 2021-06-09 LAB — TROPONIN I (HIGH SENSITIVITY)
Troponin I (High Sensitivity): 7 ng/L (ref <18)
Troponin I (High Sensitivity): 6 ng/L (ref <18)

## 2021-06-09 LAB — LIPASE, BLOOD: Lipase: 36 U/L (ref 11–51)

## 2021-06-09 MED ORDER — FENTANYL CITRATE PF 50 MCG/ML IJ SOSY
50.0000 ug | PREFILLED_SYRINGE | Freq: Once | INTRAMUSCULAR | Status: AC
  Administered 2021-06-09: 09:00:00 50 ug via INTRAVENOUS
  Filled 2021-06-09: qty 1

## 2021-06-09 MED ORDER — ONDANSETRON HCL 4 MG PO TABS: 4.0000 mg | ORAL_TABLET | Freq: Four times a day (QID) | ORAL | Status: DC | PRN

## 2021-06-09 MED ORDER — ROSUVASTATIN CALCIUM 5 MG PO TABS
5.0000 mg | ORAL_TABLET | ORAL | Status: DC
  Administered 2021-06-10 – 2021-06-16 (×5): 5 mg via ORAL
  Filled 2021-06-09 (×5): qty 1

## 2021-06-09 MED ORDER — ONDANSETRON HCL 4 MG/2ML IJ SOLN
4.0000 mg | Freq: Once | INTRAMUSCULAR | Status: AC
  Administered 2021-06-09: 09:00:00 4 mg via INTRAVENOUS
  Filled 2021-06-09: qty 2

## 2021-06-09 MED ORDER — CITALOPRAM HYDROBROMIDE 20 MG PO TABS
20.0000 mg | ORAL_TABLET | Freq: Every day | ORAL | Status: DC
  Administered 2021-06-10 – 2021-06-16 (×7): 20 mg via ORAL
  Filled 2021-06-09 (×7): qty 1

## 2021-06-09 MED ORDER — MONTELUKAST SODIUM 10 MG PO TABS
10.0000 mg | ORAL_TABLET | Freq: Every day | ORAL | Status: DC
  Administered 2021-06-09 – 2021-06-15 (×7): 10 mg via ORAL
  Filled 2021-06-09 (×7): qty 1

## 2021-06-09 MED ORDER — PIPERACILLIN-TAZOBACTAM 3.375 G IVPB
3.3750 g | Freq: Three times a day (TID) | INTRAVENOUS | Status: DC
  Administered 2021-06-09 – 2021-06-16 (×20): 3.375 g via INTRAVENOUS
  Filled 2021-06-09 (×18): qty 50

## 2021-06-09 MED ORDER — PROCHLORPERAZINE EDISYLATE 10 MG/2ML IJ SOLN
10.0000 mg | Freq: Once | INTRAMUSCULAR | Status: AC
  Administered 2021-06-09: 14:00:00 10 mg via INTRAVENOUS
  Filled 2021-06-09: qty 2

## 2021-06-09 MED ORDER — LEVOTHYROXINE SODIUM 75 MCG PO TABS
75.0000 ug | ORAL_TABLET | Freq: Every day | ORAL | Status: DC
  Administered 2021-06-10 – 2021-06-16 (×7): 75 ug via ORAL
  Filled 2021-06-09 (×7): qty 1

## 2021-06-09 MED ORDER — FOLIC ACID 0.5 MG HALF TAB
500.0000 ug | ORAL_TABLET | Freq: Every day | ORAL | Status: DC
  Filled 2021-06-09 (×2): qty 1

## 2021-06-09 MED ORDER — IOHEXOL 300 MG/ML  SOLN
100.0000 mL | Freq: Once | INTRAMUSCULAR | Status: AC | PRN
  Administered 2021-06-09: 10:00:00 100 mL via INTRAVENOUS

## 2021-06-09 MED ORDER — MORPHINE SULFATE (PF) 4 MG/ML IV SOLN
4.0000 mg | INTRAVENOUS | Status: DC | PRN
  Administered 2021-06-09 – 2021-06-10 (×3): 4 mg via INTRAVENOUS
  Filled 2021-06-09 (×3): qty 1

## 2021-06-09 MED ORDER — OXYCODONE HCL 5 MG PO TABS
5.0000 mg | ORAL_TABLET | ORAL | Status: DC | PRN
  Administered 2021-06-09 – 2021-06-15 (×13): 5 mg via ORAL
  Filled 2021-06-09 (×13): qty 1

## 2021-06-09 MED ORDER — ONDANSETRON HCL 4 MG/2ML IJ SOLN
4.0000 mg | Freq: Four times a day (QID) | INTRAMUSCULAR | Status: DC | PRN
  Administered 2021-06-09 – 2021-06-15 (×5): 4 mg via INTRAVENOUS
  Filled 2021-06-09 (×5): qty 2

## 2021-06-09 MED ORDER — FENTANYL CITRATE PF 50 MCG/ML IJ SOSY
50.0000 ug | PREFILLED_SYRINGE | Freq: Once | INTRAMUSCULAR | Status: AC
  Administered 2021-06-09: 11:00:00 50 ug via INTRAVENOUS
  Filled 2021-06-09: qty 1

## 2021-06-09 MED ORDER — ONDANSETRON HCL 4 MG/2ML IJ SOLN
4.0000 mg | Freq: Once | INTRAMUSCULAR | Status: AC
  Administered 2021-06-09: 11:00:00 4 mg via INTRAVENOUS
  Filled 2021-06-09: qty 2

## 2021-06-09 MED ORDER — ALBUTEROL SULFATE (2.5 MG/3ML) 0.083% IN NEBU: 3.0000 mL | INHALATION_SOLUTION | Freq: Four times a day (QID) | RESPIRATORY_TRACT | Status: DC | PRN

## 2021-06-09 MED ORDER — ALPRAZOLAM 0.5 MG PO TABS
0.5000 mg | ORAL_TABLET | Freq: Every evening | ORAL | Status: DC | PRN
  Administered 2021-06-13 – 2021-06-15 (×2): 0.5 mg via ORAL
  Filled 2021-06-09 (×2): qty 1

## 2021-06-09 MED ORDER — MOMETASONE FURO-FORMOTEROL FUM 100-5 MCG/ACT IN AERO
2.0000 | INHALATION_SPRAY | Freq: Two times a day (BID) | RESPIRATORY_TRACT | Status: DC
  Administered 2021-06-09 – 2021-06-10 (×2): 2 via RESPIRATORY_TRACT
  Filled 2021-06-09: qty 8.8

## 2021-06-09 MED ORDER — HYDROMORPHONE HCL 1 MG/ML IJ SOLN
0.5000 mg | Freq: Once | INTRAMUSCULAR | Status: AC
  Administered 2021-06-09: 13:00:00 0.5 mg via INTRAVENOUS
  Filled 2021-06-09: qty 1

## 2021-06-09 MED ORDER — PIPERACILLIN-TAZOBACTAM 3.375 G IVPB 30 MIN
3.3750 g | Freq: Once | INTRAVENOUS | Status: AC
  Administered 2021-06-09: 14:00:00 3.375 g via INTRAVENOUS
  Filled 2021-06-09: qty 50

## 2021-06-09 MED ORDER — SODIUM CHLORIDE 0.9 % IV BOLUS
1000.0000 mL | Freq: Once | INTRAVENOUS | Status: AC
  Administered 2021-06-09: 09:00:00 1000 mL via INTRAVENOUS

## 2021-06-09 NOTE — Progress Notes (Signed)
Notified by EDP of need for admission d/t diverticulitis. [facility] accepts patient to med-surg at [facility]. EDP is to remain responsible for orders/medical decisions while patient is holding at [facility]. Upon arrival to [facility] will assume care. Nursing staff will call patient placement to notify them of patient's arrival so that the proper [facility] member may receive the patient. Thank you.

## 2021-06-09 NOTE — Progress Notes (Signed)
Pharmacy Antibiotic Note  [name] is a 75 y.o. female admitted on 06/09/2021 with  intra-abdominal infection .  Pharmacy has been consulted for Zosyn dosing. SCr 0.7 on presentation.  Plan: Zosyn 3.375g IV (22min infusion) x1; then 3.375g IV q8h (4h infusion) Monitor clinical progress, c/s, renal function F/u de-escalation plan/LOT   Height: 5' 2.5" (158.8 cm) Weight: 59.9 kg (132 lb) IBW/kg (Calculated) : 51.25  Temp (24hrs), Avg:97.8 F (36.6 C), Min:97.8 F (36.6 C), Max:97.8 F (36.6 C)  Recent Labs  Lab 06/09/21 0827  WBC 14.7*  CREATININE 0.70    Estimated Creatinine Clearance: 50.7 mL/min (by C-G formula based on SCr of 0.7 mg/dL).    Allergies  Allergen Reactions   Remicade [Infliximab] Shortness Of Breath    Arturo Morton, PharmD, BCPS Please check AMION for all [facility] contact numbers Clinical Pharmacist 06/09/2021 1:31 PM

## 2021-06-09 NOTE — ED Provider Notes (Signed)
[facility] DEPT Provider Note   CSN: [id number] Arrival date & time: 06/09/21  3419     History Chief Complaint  Patient presents with   Abdominal Pain    BRANDALYNN OFALLON is a 75 y.o. female.  The history is provided by the patient.  Abdominal Pain Pain location:  Generalized Pain quality: aching   Pain radiates to:  Does not radiate Pain severity:  Moderate Onset quality:  Gradual Duration:  2 days Timing:  Intermittent Progression:  Waxing and waning Chronicity:  New Context: not previous surgeries   Relieved by:  Nothing Worsened by:  Nothing Ineffective treatments:  None tried Associated symptoms: nausea and vomiting   Associated symptoms: no chest pain, no chills, no cough, no diarrhea, no dysuria, no fever, no hematuria, no shortness of breath and no sore throat   Risk factors: has not had multiple surgeries       Past Medical History:  Diagnosis Date   Arthritis    Complication of anesthesia    Cough variant asthma 12/29/2017   12/29/2017  Walked RA x 3 laps @ 185 ft each stopped due to  End of study,fast pace, no sob or desat   Spirometry 12/29/2017  FEV1 1.93 (88%)  Ratio 71 p saba w/in 4 h prior - Allergy profile 12/29/2017 >  Eos 0.6 /  IgE  22 RAST pos  cat > dog and dust  - FENO 12/29/2017  =   36 - 12/29/2017  After extensive coaching inhaler device  effectiveness =    75% > try symbicort 80 2bid - PFT's  01/28/18  FEV1   Depression    Hypothyroidism    Osteomyelitis (HCC)    Mandible, right lower   PONV (postoperative nausea and vomiting)     Patient Active Problem List   Diagnosis Date Noted   Diverticulitis 06/09/2021   Cough variant asthma with ? 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 component RA bronchiolitis 03/29/2015   Anxiety 03/29/2015   Hypothyroidism 03/29/2015   Hyperglycemia 03/29/2015    Past Surgical History:  Procedure Laterality Date   HAND  SURGERY Bilateral    Synovial tissue removed   TOOTH EXTRACTION Right Oct 2016   Then had to have fluid drained from abcess x 2     OB History   No obstetric history on file.     No family history on file.  Social History   Tobacco Use   Smoking status: Former    Types: Cigarettes   Smokeless tobacco: Never  Vaping Use   Vaping Use: Never used  Substance Use Topics   Alcohol use: No   Drug use: No    Home Medications Prior to Admission medications   Medication Sig Start Date End Date Taking? Authorizing Provider  albuterol (VENTOLIN HFA) 108 (90 Base) MCG/ACT inhaler Inhale 2 puffs into the lungs every 6 (six) hours as needed for wheezing or shortness of breath.    [provider]  ALPRAZolam Duanne Moron) 0.5 MG tablet Take 0.5 mg by mouth at bedtime as needed for anxiety.    [provider]  Calcium 600-400 MG-UNIT CHEW Chew 2 tablets by mouth daily. 08/23/17   [provider]  Cetirizine HCl (ZYRTEC ALLERGY) 10 MG CAPS Take 1 capsule by mouth as needed. 12/08/16   [provider]  Cholecalciferol (VITAMIN D) 50 MCG (2000 UT) CAPS Take 1 capsule by mouth daily.  [provider]  citalopram (CELEXA) 20 MG tablet Take 1 tablet by mouth daily. 12/23/17   [provider]  denosumab (PROLIA) 60 MG/ML SOSY injection 1 Dose by Other route See admin instructions.    [provider]  fluticasone-salmeterol (ADVAIR HFA) [phone] MCG/ACT inhaler Inhale 2 puffs into the lungs 2 (two) times daily. 02/26/20   Tanda Rockers, MD  folic acid (FOLVITE) 440 MCG tablet Take 800 mcg by mouth daily.    [provider]  ibuprofen (ADVIL,MOTRIN) 200 MG tablet Take 400 mg by mouth every 6 (six) hours as needed.     [provider]  levothyroxine (SYNTHROID, LEVOTHROID) 75 MCG tablet Take 75 mcg by mouth daily before breakfast.    [provider]  methotrexate 2.5 MG tablet Take 5 tablets by mouth once a week. For 90  days    [provider]  montelukast (SINGULAIR) 10 MG tablet TAKE 1 TABLET BY MOUTH EVERYDAY AT BEDTIME 05/06/21   Tanda Rockers, MD  NONFORMULARY OR COMPOUNDED ITEM every 8 (eight) weeks. [facility] every other month    [provider]  rosuvastatin (CRESTOR) 5 MG tablet Take 5 mg by mouth every other day. 02/08/21   [provider]    Allergies    Remicade [infliximab]  Review of Systems   Review of Systems  Constitutional:  Negative for chills and fever.  HENT:  Negative for ear pain and sore throat.   Eyes:  Negative for pain and visual disturbance.  Respiratory:  Negative for cough and shortness of breath.   Cardiovascular:  Negative for chest pain and palpitations.  Gastrointestinal:  Positive for abdominal pain, nausea and vomiting. Negative for diarrhea.  Genitourinary:  Negative for dysuria and hematuria.  Musculoskeletal:  Negative for arthralgias and back pain.  Skin:  Negative for color change and rash.  Neurological:  Negative for seizures and syncope.  All other systems reviewed and are negative.  Physical Exam Updated Vital Signs BP (!) 148/93 (BP Location: Right Arm)   Pulse 92   Temp 98 F (36.7 C) (Oral)   Resp 18   Ht 5' 2.5" (1.588 m)   Wt 59.9 kg   SpO2 95%   BMI 23.76 kg/m   Physical Exam Vitals and nursing note reviewed.  Constitutional:      General: She is not in acute distress.    Appearance: She is well-developed. She is not ill-appearing.  HENT:     Head: Normocephalic and atraumatic.  Eyes:     Extraocular Movements: Extraocular movements intact.     Conjunctiva/sclera: Conjunctivae normal.     Pupils: Pupils are equal, round, and reactive to light.  Cardiovascular:     Rate and Rhythm: Normal rate and regular rhythm.     Heart sounds: Normal heart sounds. No murmur heard. Pulmonary:     Effort: Pulmonary effort is normal. No respiratory distress.     Breath sounds: Normal breath sounds.   Abdominal:     General: There is no distension.     Palpations: Abdomen is soft.     Tenderness: There is generalized abdominal tenderness.  Musculoskeletal:        General: No swelling.     Cervical back: Neck supple.  Skin:    General: Skin is warm and dry.     Capillary Refill: Capillary refill takes less than 2 seconds.  Neurological:     General: No focal deficit present.     Mental Status: She  is alert.  Psychiatric:        Mood and Affect: Mood normal.    ED Results / Procedures / Treatments   Labs (all labs ordered are listed, but only abnormal results are displayed) Labs Reviewed  CBC WITH DIFFERENTIAL/PLATELET - Abnormal; Notable for the following components:      Result Value   WBC 14.7 (*)    RDW 15.9 (*)    Neutro Abs 12.5 (*)    All other components within normal limits  COMPREHENSIVE METABOLIC PANEL - Abnormal; Notable for the following components:   CO2 21 (*)    Glucose, Bld 147 (*)    AST 14 (*)    All other components within normal limits  URINALYSIS, ROUTINE W REFLEX MICROSCOPIC - Abnormal; Notable for the following components:   Color, Urine COLORLESS (*)    Specific Gravity, Urine 1.039 (*)    Ketones, ur 40 (*)    Protein, ur TRACE (*)    All other components within normal limits  RESP PANEL BY RT-PCR (FLU A&B, COVID) ARPGX2  LIPASE, BLOOD  TROPONIN I (HIGH SENSITIVITY)  TROPONIN I (HIGH SENSITIVITY)    EKG EKG Interpretation  Date/Time:  Monday June 09 2021 08:31:54 EST Ventricular Rate:  98 PR Interval:  147 QRS Duration: 96 QT Interval:  388 QTC Calculation: 496 R Axis:   63 Text Interpretation: Sinus rhythm Confirmed by Lennice Sites ([id number]) on 06/09/2021 8:37:16 AM  Radiology CT ABDOMEN PELVIS W CONTRAST  Result Date: 06/09/2021 CLINICAL DATA:  Suspected diverticulitis in a 75 year old female. Presenting with mid abdominal pain and nausea and vomiting since yesterday. EXAM: CT ABDOMEN AND PELVIS WITH CONTRAST TECHNIQUE:  Multidetector CT imaging of the abdomen and pelvis was performed using the standard protocol following bolus administration of intravenous contrast. CONTRAST:  167mL OMNIPAQUE IOHEXOL 300 MG/ML  SOLN COMPARISON:  Comparison made with October 24, 2010. FINDINGS: Lower chest: Small pulmonary nodules in the RIGHT lower lobe largest approximately 3 mm (image 3/4) additional nodule approximately 3 mm (image 3/4) no effusion. No consolidative process. Hepatobiliary: Stable mild prominence of the extrahepatic biliary tree without intrahepatic biliary duct distension. Tiny hypodensities in the RIGHT hepatic lobe 1 not definitely present on previous imaging from 20/12 are more likely compatible with small cysts based on density on coronal images portal vein is patent. No pericholecystic stranding. Common bile duct measurement is approximately 7-8 mm but again is stable dating back to 2012. Pancreas: Normal, without mass, inflammation or ductal dilatation. Spleen: Small low-density lesion in the cephalad spleen is indeterminate but more likely benign. Spleen is normal size. Adrenals/Urinary Tract: Adrenal glands are normal. Symmetric enhancement of the bilateral kidneys. Renal cysts of varying size seen in the bilateral kidneys as well more numerous than on previous imaging and slightly increased in size in terms of the dominant cyst in the lower pole the LEFT kidney which measures 2.5 as compared to 1.6 cm. No hydronephrosis or perinephric stranding. The urinary bladder shows a smooth contour. Stomach/Bowel: No sign of bowel obstruction. The appendix is normal. Diverticular changes of the sigmoid with mild adjacent stranding about the mid sigmoid (image 63/2) no signs of free air or abscess. Vascular/Lymphatic: Aortic atherosclerosis. No sign of aneurysm. Smooth contour of the IVC. There is no gastrohepatic or hepatoduodenal ligament lymphadenopathy. No retroperitoneal or mesenteric lymphadenopathy. No pelvic sidewall  lymphadenopathy. Reproductive: 4.3 x 2.6 cm RIGHT adnexal cyst without septation or peripheral nodularity. Reproductive structures are otherwise unremarkable. Other: No abdominal wall hernia or abnormality.  No abdominopelvic ascites. Musculoskeletal: No acute bone finding. No destructive bone process. Spinal degenerative changes. Chronic appearing compression fracture at T11 shows no change. IMPRESSION: Mild uncomplicated acute diverticulitis of the mid sigmoid colon, superimposed on a background of diverticular changes. 4.3 x 2.6 cm RIGHT adnexal cyst without septation or peripheral nodularity. Recommend follow-up US in 6-12 months. Note: This recommendation does not apply to premenarchal patients and to those with increased risk (genetic, family history, elevated tumor markers or other high-risk factors) of ovarian cancer. Reference: JACR 2020 Feb; 17(2):248-254 Small pulmonary nodules in the RIGHT lower lobe largest approximately 3 mm. No follow-up needed if patient is low-risk (and has no known or suspected primary neoplasm). Non-contrast chest CT can be considered in 12 months if patient is high-risk. This recommendation follows the consensus statement: Guidelines for Management of Incidental Pulmonary Nodules Detected on CT Images: From the Fleischner Society 2017; Radiology 2017; 284:228-243. Aortic Atherosclerosis (ICD10-I70.0). Electronically Signed   By: Zetta Bills M.D.   On: 06/09/2021 10:05    Procedures Procedures   Medications Ordered in ED Medications  piperacillin-tazobactam (ZOSYN) IVPB 3.375 g (has no administration in time range)  fentaNYL (SUBLIMAZE) injection 50 mcg (50 mcg Intravenous Given 06/09/21 0852)  sodium chloride 0.9 % bolus 1,000 mL (0 mLs Intravenous Stopped 06/09/21 1118)  ondansetron (ZOFRAN) injection 4 mg (4 mg Intravenous Given 06/09/21 0852)  iohexol (OMNIPAQUE) 300 MG/ML solution 100 mL (100 mLs Intravenous Contrast Given 06/09/21 0932)  fentaNYL (SUBLIMAZE)  injection 50 mcg (50 mcg Intravenous Given 06/09/21 1116)  ondansetron (ZOFRAN) injection 4 mg (4 mg Intravenous Given 06/09/21 1115)  HYDROmorphone (DILAUDID) injection 0.5 mg (0.5 mg Intravenous Given 06/09/21 1235)  piperacillin-tazobactam (ZOSYN) IVPB 3.375 g (3.375 g Intravenous Transfusing/Transfer 06/09/21 1407)  prochlorperazine (COMPAZINE) injection 10 mg (10 mg Intravenous Given 06/09/21 1415)    ED Course  I have reviewed the triage vital signs and the nursing notes.  Pertinent labs & imaging results that were available during my care of the patient were reviewed by me and considered in my medical decision making (see chart for details).    MDM Rules/Calculators/A&P                           EMORI MUMME is here with nausea and vomiting and abdominal pain.  Pain for the last 2 days.  Normal vitals.  No fever.  Appears uncomfortable.  Diffuse tenderness of the abdomen.  Concern for bowel obstruction versus pancreatitis versus cholecystitis versus colitis versus UTI.  Will get lab work including CT scan abdomen pelvis.  Will give fluid bolus, pain medicine, nausea medicine.  CT scan shows acute diverticulitis.  Mild leukocytosis.  Troponin negative.  Viral testing negative.  Patient still with significant discomfort and nausea and vomiting.  Will give IV antibiotics and admit for further pain and nausea control.  Hemodynamically stable throughout my care.  This chart was dictated using voice recognition software.  Despite best efforts to proofread,  errors can occur which can change the documentation meaning.   Final Clinical Impression(s) / ED Diagnoses Final diagnoses:  Diverticulitis of colon    Rx / DC Orders ED Discharge Orders     None        Lennice Sites, DO 06/09/21 1520

## 2021-06-09 NOTE — H&P (Signed)
History and Physical    [name] HFW:[id number] DOB: 06/30/1946 DOA: 06/09/2021  PCP: Deland Pretty, MD  Patient coming from: Home  Chief Complaint: abdominal pain  HPI: [name] is a 75 y.o. female with medical history significant of hypothyroidism, rheumatoid arthritis, osteoarthritis. Presenting with abdominal pain. Symptoms started yesterday w/ epigastric/LUQ aching. It did not radiate. It was episodic. First started in the morning, but calmed down. She tried to eat, but wasn't able to tolerate food. She had some nausea and vomiting. She did not notice and blood. Her stool was normal. The episodes returned in the evening and in greater intensity. By this morning, it was unbearable, so she decided to come to the ED for help. She denies any other aggravating or alleviating factors.   ED Course: CT showed diverticulitis. She was started on zosyn. [facility] was called for admission.   Review of Systems:  Denies CP, dyspnea, palpitations lightheadedness, dizziness, diarrhea, fever. Review of systems is otherwise negative for all not mentioned in HPI.   PMHx Past Medical History:  Diagnosis Date   Arthritis    Complication of anesthesia    Cough variant asthma 12/29/2017   12/29/2017  Walked RA x 3 laps @ 185 ft each stopped due to  End of study,fast pace, no sob or desat   Spirometry 12/29/2017  FEV1 1.93 (88%)  Ratio 71 p saba w/in 4 h prior - Allergy profile 12/29/2017 >  Eos 0.6 /  IgE  22 RAST pos  cat > dog and dust  - FENO 12/29/2017  =   36 - 12/29/2017  After extensive coaching inhaler device  effectiveness =    75% > try symbicort 80 2bid - PFT's  01/28/18  FEV1   Depression    Hypothyroidism    Osteomyelitis (HCC)    Mandible, right lower   PONV (postoperative nausea and vomiting)     PSHx Past Surgical History:  Procedure Laterality Date   HAND SURGERY Bilateral    Synovial tissue removed   TOOTH EXTRACTION Right Oct 2016   Then had to have fluid drained from abcess x 2     SocHx  reports that she has quit smoking. Her smoking use included cigarettes. She has never used smokeless tobacco. She reports that she does not drink alcohol and does not use drugs.  Allergies  Allergen Reactions   Remicade [Infliximab] Shortness Of Breath    FamHx No family history on file.  Prior to Admission medications   Medication Sig Start Date End Date Taking? Authorizing Provider  albuterol (VENTOLIN HFA) 108 (90 Base) MCG/ACT inhaler Inhale 2 puffs into the lungs every 6 (six) hours as needed for wheezing or shortness of breath.    [provider]  ALPRAZolam Duanne Moron) 0.5 MG tablet Take 0.5 mg by mouth at bedtime as needed for anxiety.    [provider]  Calcium 600-400 MG-UNIT CHEW Chew 2 tablets by mouth daily. 08/23/17   [provider]  Cetirizine HCl (ZYRTEC ALLERGY) 10 MG CAPS Take 1 capsule by mouth as needed. 12/08/16   [provider]  Cholecalciferol (VITAMIN D) 50 MCG (2000 UT) CAPS Take 1 capsule by mouth daily.    [provider]  citalopram (CELEXA) 20 MG tablet Take 1 tablet by mouth daily. 12/23/17   [provider]  denosumab (PROLIA) 60 MG/ML SOSY injection 1 Dose by Other route See admin instructions.    [provider]  fluticasone-salmeterol (ADVAIR HFA) [phone] MCG/ACT inhaler  Inhale 2 puffs into the lungs 2 (two) times daily. 02/26/20   Tanda Rockers, MD  folic acid (FOLVITE) 462 MCG tablet Take 800 mcg by mouth daily.    [provider]  ibuprofen (ADVIL,MOTRIN) 200 MG tablet Take 400 mg by mouth every 6 (six) hours as needed.     [provider]  levothyroxine (SYNTHROID, LEVOTHROID) 75 MCG tablet Take 75 mcg by mouth daily before breakfast.    [provider]  methotrexate 2.5 MG tablet Take 5 tablets by mouth once a week. For 90 days    [provider]  montelukast (SINGULAIR) 10 MG tablet TAKE 1 TABLET BY MOUTH EVERYDAY AT BEDTIME 05/06/21   Tanda Rockers, MD  NONFORMULARY OR COMPOUNDED ITEM every 8 (eight) weeks. [facility] every other month    [provider]  rosuvastatin (CRESTOR) 5 MG tablet Take 5 mg by mouth every other day. 02/08/21   [provider]    Physical Exam: Vitals:   06/09/21 1300 06/09/21 1330 06/09/21 1400 06/09/21 1454  BP: (!) 169/98 (!) 162/89 (!) 164/88 (!) 148/93  Pulse: 82 96  92  Resp: (!) 30 15  18   Temp:    98 F (36.7 C)  TempSrc:    Oral  SpO2: 94% 95%  95%  Weight:      Height:        General: 75 y.o. female resting in bed in NAD Eyes: PERRL, normal sclera ENMT: Nares patent w/o discharge, orophaynx clear, dentition normal, ears w/o discharge/lesions/ulcers Neck: Supple, trachea midline Cardiovascular: RRR, +S1, S2, no m/g/r, equal pulses throughout Respiratory: CTABL, no w/r/r, normal WOB GI: BS+, ND, TTP LUQ/LLQ, no masses noted, no organomegaly noted MSK: No e/c/c Neuro: A&O x 3, no focal deficits Psyc: Appropriate interaction and affect, calm/cooperative  Labs on Admission: I have personally reviewed following labs and imaging studies  CBC: Recent Labs  Lab 06/09/21 0827  WBC 14.7*  NEUTROABS 12.5*  HGB 12.2  HCT 38.0  MCV 83.9  PLT 703   Basic Metabolic Panel: Recent Labs  Lab 06/09/21 0827  NA 139  K 3.7  CL 104  CO2 21*  GLUCOSE 147*  BUN 18  CREATININE 0.70  CALCIUM 9.3   GFR: Estimated Creatinine Clearance: 50.7 mL/min (by C-G formula based on SCr of 0.7 mg/dL). Liver Function Tests: Recent Labs  Lab 06/09/21 0827  AST 14*  ALT 11  ALKPHOS 45  BILITOT 1.0  PROT 7.2  ALBUMIN 4.6   Recent Labs  Lab 06/09/21 0827  LIPASE 36   No results for input(s): AMMONIA in the last 168 hours. Coagulation Profile: No results for input(s): INR, PROTIME in the last 168 hours. Cardiac Enzymes: No results for input(s): CKTOTAL, CKMB, CKMBINDEX, TROPONINI in the last 168 hours. BNP (last 3 results) No results for input(s): PROBNP  in the last 8760 hours. HbA1C: No results for input(s): HGBA1C in the last 72 hours. CBG: No results for input(s): GLUCAP in the last 168 hours. Lipid Profile: No results for input(s): CHOL, HDL, LDLCALC, TRIG, CHOLHDL, LDLDIRECT in the last 72 hours. Thyroid Function Tests: No results for input(s): TSH, T4TOTAL, FREET4, T3FREE, THYROIDAB in the last 72 hours. Anemia Panel: No results for input(s): VITAMINB12, FOLATE, FERRITIN, TIBC, IRON, RETICCTPCT in the last 72 hours. Urine analysis:    Component Value Date/Time   COLORURINE COLORLESS (A) 06/09/2021 0827   APPEARANCEUR CLEAR 06/09/2021 0827   LABSPEC 1.039 (H) 06/09/2021 0827   PHURINE 8.0  06/09/2021 0827   GLUCOSEU NEGATIVE 06/09/2021 0827   HGBUR NEGATIVE 06/09/2021 0827   BILIRUBINUR NEGATIVE 06/09/2021 0827   KETONESUR 40 (A) 06/09/2021 0827   PROTEINUR TRACE (A) 06/09/2021 0827   UROBILINOGEN 0.2 10/24/2010 1748   NITRITE NEGATIVE 06/09/2021 0827   LEUKOCYTESUR NEGATIVE 06/09/2021 0827    Radiological Exams on Admission: CT ABDOMEN PELVIS W CONTRAST  Result Date: 06/09/2021 CLINICAL DATA:  Suspected diverticulitis in a 75 year old female. Presenting with mid abdominal pain and nausea and vomiting since yesterday. EXAM: CT ABDOMEN AND PELVIS WITH CONTRAST TECHNIQUE: Multidetector CT imaging of the abdomen and pelvis was performed using the standard protocol following bolus administration of intravenous contrast. CONTRAST:  167mL OMNIPAQUE IOHEXOL 300 MG/ML  SOLN COMPARISON:  Comparison made with October 24, 2010. FINDINGS: Lower chest: Small pulmonary nodules in the RIGHT lower lobe largest approximately 3 mm (image 3/4) additional nodule approximately 3 mm (image 3/4) no effusion. No consolidative process. Hepatobiliary: Stable mild prominence of the extrahepatic biliary tree without intrahepatic biliary duct distension. Tiny hypodensities in the RIGHT hepatic lobe 1 not definitely present on previous imaging from 20/12 are  more likely compatible with small cysts based on density on coronal images portal vein is patent. No pericholecystic stranding. Common bile duct measurement is approximately 7-8 mm but again is stable dating back to 2012. Pancreas: Normal, without mass, inflammation or ductal dilatation. Spleen: Small low-density lesion in the cephalad spleen is indeterminate but more likely benign. Spleen is normal size. Adrenals/Urinary Tract: Adrenal glands are normal. Symmetric enhancement of the bilateral kidneys. Renal cysts of varying size seen in the bilateral kidneys as well more numerous than on previous imaging and slightly increased in size in terms of the dominant cyst in the lower pole the LEFT kidney which measures 2.5 as compared to 1.6 cm. No hydronephrosis or perinephric stranding. The urinary bladder shows a smooth contour. Stomach/Bowel: No sign of bowel obstruction. The appendix is normal. Diverticular changes of the sigmoid with mild adjacent stranding about the mid sigmoid (image 63/2) no signs of free air or abscess. Vascular/Lymphatic: Aortic atherosclerosis. No sign of aneurysm. Smooth contour of the IVC. There is no gastrohepatic or hepatoduodenal ligament lymphadenopathy. No retroperitoneal or mesenteric lymphadenopathy. No pelvic sidewall lymphadenopathy. Reproductive: 4.3 x 2.6 cm RIGHT adnexal cyst without septation or peripheral nodularity. Reproductive structures are otherwise unremarkable. Other: No abdominal wall hernia or abnormality. No abdominopelvic ascites. Musculoskeletal: No acute bone finding. No destructive bone process. Spinal degenerative changes. Chronic appearing compression fracture at T11 shows no change. IMPRESSION: Mild uncomplicated acute diverticulitis of the mid sigmoid colon, superimposed on a background of diverticular changes. 4.3 x 2.6 cm RIGHT adnexal cyst without septation or peripheral nodularity. Recommend follow-up US in 6-12 months. Note: This recommendation does not  apply to premenarchal patients and to those with increased risk (genetic, family history, elevated tumor markers or other high-risk factors) of ovarian cancer. Reference: JACR 2020 Feb; 17(2):248-254 Small pulmonary nodules in the RIGHT lower lobe largest approximately 3 mm. No follow-up needed if patient is low-risk (and has no known or suspected primary neoplasm). Non-contrast chest CT can be considered in 12 months if patient is high-risk. This recommendation follows the consensus statement: Guidelines for Management of Incidental Pulmonary Nodules Detected on CT Images: From the Fleischner Society 2017; Radiology 2017; 284:228-243. Aortic Atherosclerosis (ICD10-I70.0). Electronically Signed   By: Zetta Bills M.D.   On: 06/09/2021 10:05    EKG: Independently reviewed. Sinus, no st elevations  Assessment/Plan Diverticulitis     -  place in obs, med-surg     - continue zosyn     - may have ice chips, sips; NPO otherwise     - fluids, antiemetics, pain control  Hypothyroidism     - continue home synthroid  Anxiety     - continue home citalopram/xanax  Rheumatoid arthritis Osteoarthritis     - hold MTX for now  Cough variant asthma     - continue home regimen  HLD     - continue home regimen  DVT prophylaxis: SCDs  Code Status: DNI  Family Communication: None at bedside  Consults called: None   Status is: Observation  The patient remains OBS appropriate and will d/c before 2 midnights.  Jonnie Finner DO [facility]  If 7PM-7AM, please contact night-coverage [email or web address]  06/09/2021, 3:38 PM

## 2021-06-09 NOTE — Discharge Instructions (Signed)
Overall pain is from inflammation in your colon.  Please follow-up with your primary care doctor to discuss incidental findings in your lung and pelvic area.  These results are below.   IMPRESSION:  Mild uncomplicated acute diverticulitis of the mid sigmoid colon,  superimposed on a background of diverticular changes.   4.3 x 2.6 cm RIGHT adnexal cyst without septation or peripheral  nodularity. Recommend follow-up US in 6-12 months. Note: This  recommendation does not apply to premenarchal patients and to those  with increased risk (genetic, family history, elevated tumor markers  or other high-risk factors) of ovarian cancer. Reference: JACR 2020  Feb; 17(2):248-254   Small pulmonary nodules in the RIGHT lower lobe largest  approximately 3 mm. No follow-up needed if patient is low-risk (and  has no known or suspected primary neoplasm). Non-contrast chest CT  can be considered in 12 months if patient is high-risk. This  recommendation follows the consensus statement: Guidelines for  Management of Incidental Pulmonary Nodules Detected on CT Images:  From the Fleischner Society 2017; Radiology 2017; 284:228-243.   Aortic Atherosclerosis (ICD10-I70.0).

## 2021-06-09 NOTE — ED Notes (Signed)
Called lab to add on Trop

## 2021-06-09 NOTE — ED Triage Notes (Signed)
BIB EMS with mid abd pain since yesterday, N/V yesterday, today dry heaves. Feels light headed, has not been eating or drinking.

## 2021-06-10 DIAGNOSIS — K5792 Diverticulitis of intestine, part unspecified, without perforation or abscess without bleeding: Secondary | ICD-10-CM | POA: Diagnosis not present

## 2021-06-10 DIAGNOSIS — Z79899 Other long term (current) drug therapy: Secondary | ICD-10-CM | POA: Diagnosis not present

## 2021-06-10 DIAGNOSIS — A419 Sepsis, unspecified organism: Secondary | ICD-10-CM | POA: Diagnosis present

## 2021-06-10 DIAGNOSIS — Z87891 Personal history of nicotine dependence: Secondary | ICD-10-CM | POA: Diagnosis not present

## 2021-06-10 DIAGNOSIS — M1990 Unspecified osteoarthritis, unspecified site: Secondary | ICD-10-CM | POA: Diagnosis present

## 2021-06-10 DIAGNOSIS — Z20822 Contact with and (suspected) exposure to covid-19: Secondary | ICD-10-CM | POA: Diagnosis present

## 2021-06-10 DIAGNOSIS — F419 Anxiety disorder, unspecified: Secondary | ICD-10-CM | POA: Diagnosis present

## 2021-06-10 DIAGNOSIS — Z888 Allergy status to other drugs, medicaments and biological substances status: Secondary | ICD-10-CM | POA: Diagnosis not present

## 2021-06-10 DIAGNOSIS — M869 Osteomyelitis, unspecified: Secondary | ICD-10-CM | POA: Diagnosis present

## 2021-06-10 DIAGNOSIS — Z79890 Hormone replacement therapy (postmenopausal): Secondary | ICD-10-CM | POA: Diagnosis not present

## 2021-06-10 DIAGNOSIS — K5732 Diverticulitis of large intestine without perforation or abscess without bleeding: Secondary | ICD-10-CM | POA: Diagnosis present

## 2021-06-10 DIAGNOSIS — F32A Depression, unspecified: Secondary | ICD-10-CM | POA: Diagnosis present

## 2021-06-10 DIAGNOSIS — E039 Hypothyroidism, unspecified: Secondary | ICD-10-CM | POA: Diagnosis present

## 2021-06-10 DIAGNOSIS — R109 Unspecified abdominal pain: Secondary | ICD-10-CM | POA: Diagnosis not present

## 2021-06-10 DIAGNOSIS — M069 Rheumatoid arthritis, unspecified: Secondary | ICD-10-CM | POA: Diagnosis present

## 2021-06-10 DIAGNOSIS — J45991 Cough variant asthma: Secondary | ICD-10-CM | POA: Diagnosis present

## 2021-06-10 DIAGNOSIS — E785 Hyperlipidemia, unspecified: Secondary | ICD-10-CM | POA: Diagnosis present

## 2021-06-10 LAB — COMPREHENSIVE METABOLIC PANEL
ALT: 17 U/L (ref 0–44)
AST: 18 U/L (ref 15–41)
Albumin: 4.2 g/dL (ref 3.5–5.0)
Alkaline Phosphatase: 43 U/L (ref 38–126)
Anion gap: 9 (ref 5–15)
BUN: 20 mg/dL (ref 8–23)
CO2: 22 mmol/L (ref 22–32)
Calcium: 7.8 mg/dL — ABNORMAL LOW (ref 8.9–10.3)
Chloride: 105 mmol/L (ref 98–111)
Creatinine, Ser: 0.73 mg/dL (ref 0.44–1.00)
GFR, Estimated: >60 mL/min (ref >60)
Glucose, Bld: 125 mg/dL — ABNORMAL HIGH (ref 70–99)
Potassium: 3 mmol/L — ABNORMAL LOW (ref 3.5–5.1)
Sodium: 136 mmol/L (ref 135–145)
Total Bilirubin: 1.2 mg/dL (ref 0.3–1.2)
Total Protein: 7 g/dL (ref 6.5–8.1)

## 2021-06-10 LAB — CBC
HCT: 36.9 % (ref 36.0–46.0)
Hemoglobin: 11.5 g/dL — ABNORMAL LOW (ref 12.0–15.0)
MCH: 26.7 pg (ref 26.0–34.0)
MCHC: 31.2 g/dL (ref 30.0–36.0)
MCV: 85.6 fL (ref 80.0–100.0)
Platelets: 282 10*3/uL (ref 150–400)
RBC: 4.31 MIL/uL (ref 3.87–5.11)
RDW: 16.3 % — ABNORMAL HIGH (ref 11.5–15.5)
WBC: 12.1 10*3/uL — ABNORMAL HIGH (ref 4.0–10.5)
nRBC: 0 % (ref 0.0–0.2)

## 2021-06-10 MED ORDER — SODIUM CHLORIDE 0.9 % IV SOLN: INTRAVENOUS | Status: DC

## 2021-06-10 MED ORDER — HYDROMORPHONE HCL 1 MG/ML IJ SOLN
INTRAMUSCULAR | Status: AC
  Filled 2021-06-10: qty 1

## 2021-06-10 MED ORDER — PROMETHAZINE HCL 25 MG PO TABS
12.5000 mg | ORAL_TABLET | Freq: Four times a day (QID) | ORAL | Status: DC | PRN
  Administered 2021-06-10 – 2021-06-13 (×5): 12.5 mg via ORAL
  Filled 2021-06-10 (×4): qty 1

## 2021-06-10 MED ORDER — PROMETHAZINE HCL 12.5 MG RE SUPP
12.5000 mg | Freq: Four times a day (QID) | RECTAL | Status: DC | PRN
  Filled 2021-06-10: qty 1

## 2021-06-10 MED ORDER — POTASSIUM CHLORIDE 10 MEQ/100ML IV SOLN
10.0000 meq | INTRAVENOUS | Status: AC
  Administered 2021-06-10 (×5): 10 meq via INTRAVENOUS
  Filled 2021-06-10: qty 100

## 2021-06-10 MED ORDER — SODIUM CHLORIDE 0.9 % IV SOLN
6.2500 mg | Freq: Four times a day (QID) | INTRAVENOUS | Status: DC | PRN
  Filled 2021-06-10: qty 0.25

## 2021-06-10 MED ORDER — ENOXAPARIN SODIUM 40 MG/0.4ML IJ SOSY
40.0000 mg | PREFILLED_SYRINGE | INTRAMUSCULAR | Status: DC
  Administered 2021-06-10 – 2021-06-15 (×6): 40 mg via SUBCUTANEOUS
  Filled 2021-06-10 (×5): qty 0.4

## 2021-06-10 MED ORDER — PROMETHAZINE HCL 25 MG PO TABS
ORAL_TABLET | ORAL | Status: AC
  Filled 2021-06-10: qty 1

## 2021-06-10 MED ORDER — HYDROMORPHONE HCL 1 MG/ML IJ SOLN
1.0000 mg | INTRAMUSCULAR | Status: DC | PRN
  Administered 2021-06-10 – 2021-06-13 (×9): 1 mg via INTRAVENOUS
  Filled 2021-06-10 (×8): qty 1

## 2021-06-10 MED ORDER — MAGNESIUM SULFATE 2 GM/50ML IV SOLN
2.0000 g | Freq: Once | INTRAVENOUS | Status: AC
  Administered 2021-06-10: 2 g via INTRAVENOUS

## 2021-06-10 MED ORDER — ENOXAPARIN SODIUM 40 MG/0.4ML IJ SOSY
PREFILLED_SYRINGE | INTRAMUSCULAR | Status: AC
  Filled 2021-06-10: qty 0.4

## 2021-06-10 MED ORDER — MAGNESIUM SULFATE 2 GM/50ML IV SOLN
INTRAVENOUS | Status: AC
  Filled 2021-06-10: qty 50

## 2021-06-10 MED ORDER — POTASSIUM CHLORIDE 10 MEQ/100ML IV SOLN
INTRAVENOUS | Status: AC
  Filled 2021-06-10: qty 100

## 2021-06-10 NOTE — Progress Notes (Signed)
[facility] PROGRESS NOTE    Progress Note  [name]  JJK:[id number] DOB: [birth year] DOA: 06/09/2021 PCP: Deland Pretty, MD     Brief Narrative:   [name] is an 75 y.o. female past medical history significant for hypothyroidism, rheumatoid arthritis comes in for abdominal pain that started the day prior to admission with epigastric and right lower quadrant CT scan of the abdomen and pelvis in the ED showed acute diverticulitis was started on Zosyn.    Assessment/Plan:   Sepsis due to acute Diverticulitis Admit to inpatient. Agree with IV Zosyn. Still complaining of pain, will DC morphine and change her to IV hydromorphone. Add Phenergan for nausea as she has been dry heaving several times when I was in the room. No rebound or guarding right lower quadrant still tender.  Hypothyroidism: Continue Synthroid.  Anxiety:  Continue citalopram and Xanax  Rheumatoid arthritis/osteoarthritis: Hold methotrexate for now.  Cough variant asthma: Continue current home regimen.   DVT prophylaxis: lovenox Family Communication:none Status is: Observation  The patient will require care spanning > 2 midnights and should be moved to inpatient because: Acute diverticulitis with nausea and vomiting and not able to tolerate anything currently NPO.       Code Status:     Code Status Orders  (From admission, onward)           Start     Ordered   06/09/21 1555  Limited resuscitation (code)  Continuous       Question Answer Comment  In the event of cardiac or respiratory ARREST: Initiate Code Blue, Call Rapid Response Yes   In the event of cardiac or respiratory ARREST: Perform CPR Yes   In the event of cardiac or respiratory ARREST: Perform Intubation/Mechanical Ventilation No   In the event of cardiac or respiratory ARREST: Use NIPPV/BiPAp only if indicated Yes   In the event of cardiac or respiratory ARREST: Administer ACLS medications if indicated Yes   In  the event of cardiac or respiratory ARREST: Perform Defibrillation or Cardioversion if indicated Yes   Comments DNI      06/09/21 1555           Code Status History     This patient has a current code status but no historical code status.      Advance Directive Documentation    Flowsheet Row Most Recent Value  Type of Advance Directive Healthcare Power of Attorney, Living will  Pre-existing out of facility DNR order (yellow form or pink MOST form) --  "MOST" Form in Place? --         IV Access:   Peripheral IV   Procedures and diagnostic studies:   CT ABDOMEN PELVIS W CONTRAST  Result Date: 06/09/2021 CLINICAL DATA:  Suspected diverticulitis in a 75 year old female. Presenting with mid abdominal pain and nausea and vomiting since yesterday. EXAM: CT ABDOMEN AND PELVIS WITH CONTRAST TECHNIQUE: Multidetector CT imaging of the abdomen and pelvis was performed using the standard protocol following bolus administration of intravenous contrast. CONTRAST:  114mL OMNIPAQUE IOHEXOL 300 MG/ML  SOLN COMPARISON:  Comparison made with October 24, 2010. FINDINGS: Lower chest: Small pulmonary nodules in the RIGHT lower lobe largest approximately 3 mm (image 3/4) additional nodule approximately 3 mm (image 3/4) no effusion. No consolidative process. Hepatobiliary: Stable mild prominence of the extrahepatic biliary tree without intrahepatic biliary duct distension. Tiny hypodensities in the RIGHT hepatic lobe 1 not definitely present on previous imaging from 20/12 are more likely compatible  with small cysts based on density on coronal images portal vein is patent. No pericholecystic stranding. Common bile duct measurement is approximately 7-8 mm but again is stable dating back to 2012. Pancreas: Normal, without mass, inflammation or ductal dilatation. Spleen: Small low-density lesion in the cephalad spleen is indeterminate but more likely benign. Spleen is normal size. Adrenals/Urinary Tract:  Adrenal glands are normal. Symmetric enhancement of the bilateral kidneys. Renal cysts of varying size seen in the bilateral kidneys as well more numerous than on previous imaging and slightly increased in size in terms of the dominant cyst in the lower pole the LEFT kidney which measures 2.5 as compared to 1.6 cm. No hydronephrosis or perinephric stranding. The urinary bladder shows a smooth contour. Stomach/Bowel: No sign of bowel obstruction. The appendix is normal. Diverticular changes of the sigmoid with mild adjacent stranding about the mid sigmoid (image 63/2) no signs of free air or abscess. Vascular/Lymphatic: Aortic atherosclerosis. No sign of aneurysm. Smooth contour of the IVC. There is no gastrohepatic or hepatoduodenal ligament lymphadenopathy. No retroperitoneal or mesenteric lymphadenopathy. No pelvic sidewall lymphadenopathy. Reproductive: 4.3 x 2.6 cm RIGHT adnexal cyst without septation or peripheral nodularity. Reproductive structures are otherwise unremarkable. Other: No abdominal wall hernia or abnormality. No abdominopelvic ascites. Musculoskeletal: No acute bone finding. No destructive bone process. Spinal degenerative changes. Chronic appearing compression fracture at T11 shows no change. IMPRESSION: Mild uncomplicated acute diverticulitis of the mid sigmoid colon, superimposed on a background of diverticular changes. 4.3 x 2.6 cm RIGHT adnexal cyst without septation or peripheral nodularity. Recommend follow-up US in 6-12 months. Note: This recommendation does not apply to premenarchal patients and to those with increased risk (genetic, family history, elevated tumor markers or other high-risk factors) of ovarian cancer. Reference: JACR 2020 Feb; 17(2):248-254 Small pulmonary nodules in the RIGHT lower lobe largest approximately 3 mm. No follow-up needed if patient is low-risk (and has no known or suspected primary neoplasm). Non-contrast chest CT can be considered in 12 months if patient  is high-risk. This recommendation follows the consensus statement: Guidelines for Management of Incidental Pulmonary Nodules Detected on CT Images: From the Fleischner Society 2017; Radiology 2017; 284:228-243. Aortic Atherosclerosis (ICD10-I70.0). Electronically Signed   By: Zetta Bills M.D.   On: 06/09/2021 10:05     Medical Consultants:   None.   Subjective:    [name] still complaining of abdominal pain is intermittent comes and goes at baseline is a 6.  Nauseated no appetite with dry heaving  Objective:    Vitals:   06/09/21 2013 06/09/21 2157 06/10/21 0119 06/10/21 0557  BP:  (!) 152/84 (!) 153/80 (!) 152/82  Pulse:  86 98 (!) 101  Resp:  18 18 18   Temp:  98.4 F (36.9 C) 98.1 F (36.7 C) 98.1 F (36.7 C)  TempSrc:  Oral Oral Oral  SpO2: 96% 92% 99% 97%  Weight:      Height:       SpO2: 97 %   Intake/Output Summary (Last 24 hours) at 06/10/2021 0838 Last data filed at 06/10/2021 0600 Gross per 24 hour  Intake 1360.26 ml  Output 600 ml  Net 760.26 ml   Filed Weights   06/09/21 0827  Weight: 59.9 kg    Exam: General exam: In no acute distress. Respiratory system: Good air movement and clear to auscultation. Cardiovascular system: S1 & S2 heard, RRR. No JVD. Gastrointestinal system: Positive bowel sounds soft left lower quadrant tenderness no rebound or guarding Extremities: No pedal edema.  Skin: No rashes, lesions or ulcers Psychiatry: Judgement and insight appear normal. Mood & affect appropriate.    Data Reviewed:    Labs: Basic Metabolic Panel: Recent Labs  Lab 06/09/21 0827 06/10/21 0420  NA 139 136  K 3.7 3.0*  CL 104 105  CO2 21* 22  GLUCOSE 147* 125*  BUN 18 20  CREATININE 0.70 0.73  CALCIUM 9.3 7.8*   GFR Estimated Creatinine Clearance: 50.7 mL/min (by C-G formula based on SCr of 0.73 mg/dL). Liver Function Tests: Recent Labs  Lab 06/09/21 0827 06/10/21 0420  AST 14* 18  ALT 11 17  ALKPHOS 45 43  BILITOT 1.0 1.2   PROT 7.2 7.0  ALBUMIN 4.6 4.2   Recent Labs  Lab 06/09/21 0827  LIPASE 36   No results for input(s): AMMONIA in the last 168 hours. Coagulation profile No results for input(s): INR, PROTIME in the last 168 hours. COVID-19 Labs  No results for input(s): DDIMER, FERRITIN, LDH, CRP in the last 72 hours.  Lab Results  Component Value Date   SARSCOV2NAA NEGATIVE 06/09/2021   [place] Not Detected 02/17/2019    CBC: Recent Labs  Lab 06/09/21 0827 06/10/21 0420  WBC 14.7* 12.1*  NEUTROABS 12.5*  --   HGB 12.2 11.5*  HCT 38.0 36.9  MCV 83.9 85.6  PLT 332 282   Cardiac Enzymes: No results for input(s): CKTOTAL, CKMB, CKMBINDEX, TROPONINI in the last 168 hours. BNP (last 3 results) No results for input(s): PROBNP in the last 8760 hours. CBG: No results for input(s): GLUCAP in the last 168 hours. D-Dimer: No results for input(s): DDIMER in the last 72 hours. Hgb A1c: No results for input(s): HGBA1C in the last 72 hours. Lipid Profile: No results for input(s): CHOL, HDL, LDLCALC, TRIG, CHOLHDL, LDLDIRECT in the last 72 hours. Thyroid function studies: No results for input(s): TSH, T4TOTAL, T3FREE, THYROIDAB in the last 72 hours.  Invalid input(s): FREET3 Anemia work up: No results for input(s): VITAMINB12, FOLATE, FERRITIN, TIBC, IRON, RETICCTPCT in the last 72 hours. Sepsis Labs: Recent Labs  Lab 06/09/21 0827 06/10/21 0420  WBC 14.7* 12.1*   Microbiology Recent Results (from the past 240 hour(s))  Resp Panel by RT-PCR (Flu A&B, Covid) Nasopharyngeal Swab     Status: None   Collection Time: 06/09/21  8:37 AM   Specimen: Nasopharyngeal Swab; Nasopharyngeal(NP) swabs in vial transport medium  Result Value Ref Range Status   SARS Coronavirus 2 by RT PCR NEGATIVE NEGATIVE Final    Comment: (NOTE) SARS-CoV-2 target nucleic acids are NOT DETECTED.  The SARS-CoV-2 RNA is generally detectable in upper respiratory specimens during the acute phase of infection.  The lowest concentration of SARS-CoV-2 viral copies this assay can detect is 138 copies/mL. A negative result does not preclude SARS-Cov-2 infection and should not be used as the sole basis for treatment or other patient management decisions. A negative result may occur with  improper specimen collection/handling, submission of specimen other than nasopharyngeal swab, presence of viral mutation(s) within the areas targeted by this assay, and inadequate number of viral copies(<138 copies/mL). A negative result must be combined with clinical observations, patient history, and epidemiological information. The expected result is Negative.  Fact Sheet for Patients:  EntrepreneurPulse.com.au  Fact Sheet for Healthcare Providers:  IncredibleEmployment.be  This test is no t yet approved or cleared by the [place] FDA and  has been authorized for detection and/or diagnosis of SARS-CoV-2 by FDA under an Emergency Use Authorization (EUA). This EUA will remain  in effect (meaning this test can be used) for the duration of the COVID-19 declaration under Section 564(b)(1) of the Act, 21 U.S.C.section 360bbb-3(b)(1), unless the authorization is terminated  or revoked sooner.       Influenza A by PCR NEGATIVE NEGATIVE Final   Influenza B by PCR NEGATIVE NEGATIVE Final    Comment: (NOTE) The Xpert Xpress SARS-CoV-2/FLU/RSV plus assay is intended as an aid in the diagnosis of influenza from Nasopharyngeal swab specimens and should not be used as a sole basis for treatment. Nasal washings and aspirates are unacceptable for Xpert Xpress SARS-CoV-2/FLU/RSV testing.  Fact Sheet for Patients: EntrepreneurPulse.com.au  Fact Sheet for Healthcare Providers: IncredibleEmployment.be  This test is not yet approved or cleared by the [place] FDA and has been authorized for detection and/or diagnosis of SARS-CoV-2 by FDA  under an Emergency Use Authorization (EUA). This EUA will remain in effect (meaning this test can be used) for the duration of the COVID-19 declaration under Section 564(b)(1) of the Act, 21 U.S.C. section 360bbb-3(b)(1), unless the authorization is terminated or revoked.  Performed at [facility], [place], [place] [address]      Medications:    citalopram  20 mg Oral Daily   folic acid  820 mcg Oral Daily   levothyroxine  75 mcg Oral Q0600   mometasone-formoterol  2 puff Inhalation BID   montelukast  10 mg Oral QHS   rosuvastatin  5 mg Oral QODAY   Continuous Infusions:  piperacillin-tazobactam (ZOSYN)  IV 3.375 g (06/10/21 0523)      LOS: 0 days   Charlynne Cousins  [facility]  06/10/2021, 8:38 AM

## 2021-06-10 NOTE — Progress Notes (Signed)
PHARMACY NOTE -  zosyn  Pharmacy has been assisting with dosing of zosyn for diverticulitis. Dosage remains stable at 3.375 gm IV q8h (infuse over 4 hrs) and need for further dosage adjustment appears unlikely at present.    Will sign off at this time.  Please reconsult if a change in clinical status warrants re-evaluation of dosage.  Dia Sitter, PharmD, BCPS 06/10/2021 10:49 AM

## 2021-06-11 DIAGNOSIS — K5732 Diverticulitis of large intestine without perforation or abscess without bleeding: Secondary | ICD-10-CM

## 2021-06-11 MED ORDER — FOLIC ACID 1 MG PO TABS
0.5000 mg | ORAL_TABLET | Freq: Every day | ORAL | Status: DC
  Administered 2021-06-11 – 2021-06-16 (×6): 0.5 mg via ORAL
  Filled 2021-06-11 (×6): qty 1

## 2021-06-11 NOTE — Progress Notes (Signed)
   06/11/21 1200  Mobility  Activity Ambulated in hall  Level of Assistance Standby assist, set-up cues, supervision of patient - no hands on  Assistive Device Front wheel walker  Distance Ambulated (ft) 200 ft  Mobility Ambulated with assistance in hallway  Mobility Response Tolerated well  Mobility performed by Mobility specialist  $Mobility charge 1 Mobility   Pt agreeable to mobilize this morning. Ambulated about 244ft in all with RW, tolerated well. Pt stated that she was experiencing some upper abdominal cramping (4/10 pain). Otherwise, no complaints. Let pt in bed with call bell at side. RN notified of session.   [place] [facility] Re[facility] Services Office: [phone]

## 2021-06-11 NOTE — Progress Notes (Signed)
PROGRESS NOTE    [name]  UTM:[id number] DOB: [birth year] DOA: 06/09/2021 PCP: Deland Pretty, MD    Brief Narrative:  75 y.o. female past medical history significant for hypothyroidism, rheumatoid arthritis comes in for abdominal pain that started the day prior to admission with epigastric and right lower quadrant CT scan of the abdomen and pelvis in the ED showed acute diverticulitis was started on Zosyn.  Assessment & Plan:   Principal Problem:   Diverticulitis Active Problems:   Acute diverticulitis  Sepsis due to acute Diverticulitis present on admit Has been continued on IV zosyn WBC trended down to 12.1k today from 14.7k Pt continues with abd pain, continue with analgesia and antiemetic as needed Continue with bowel rest and hydration   Hypothyroidism: Continue Synthroid as tolerated Most recent TSH from 2012 of 3.702  Anxiety:  Continue citalopram and Xanax   Rheumatoid arthritis/osteoarthritis: Hold methotrexate for now.  Cough variant asthma: Continue current home regimen. On minimal O2 support, no audible wheezintg   DVT prophylaxis: Lovenox subq Code Status: Partial Family Communication: Pt in room, family not at bedside  Status is: Inpatient  Remains inpatient appropriate because: Severity of illness, remains NPO   Consultants:    Procedures:    Antimicrobials: Anti-infectives (From admission, onward)    Start     Dose/Rate Route Frequency Ordered Stop   06/09/21 2200  piperacillin-tazobactam (ZOSYN) IVPB 3.375 g        3.375 g 12.5 mL/hr over 240 Minutes Intravenous Every 8 hours 06/09/21 1335     06/09/21 1345  piperacillin-tazobactam (ZOSYN) IVPB 3.375 g        3.375 g 100 mL/hr over 30 Minutes Intravenous  Once 06/09/21 1335 06/09/21 1426       Subjective: Still complaining of abd pain, doesn't feel ready to advance diet  Objective: Vitals:   06/10/21 2324 06/11/21 0539 06/11/21 0749 06/11/21 1251  BP: (!) 142/74 138/76  (!)  159/94  Pulse: 91 85 90 85  Resp: 18 18  18   Temp: 98.7 F (37.1 C) 98.2 F (36.8 C)  98 F (36.7 C)  TempSrc: Oral Oral    SpO2: 94% 97% 96% 96%  Weight:      Height:        Intake/Output Summary (Last 24 hours) at 06/11/2021 1449 Last data filed at 06/11/2021 1000 Gross per 24 hour  Intake 2442.68 ml  Output 700 ml  Net 1742.68 ml   Filed Weights   06/09/21 0827  Weight: 59.9 kg    Examination: General exam: Awake, laying in bed, in nad Respiratory system: Normal respiratory effort, no wheezing Cardiovascular system: regular rate, s1, s2 Gastrointestinal system: Soft, nondistended, pos BS Central nervous system: CN2-12 grossly intact, strength intact Extremities: Perfused, no clubbing Skin: Normal skin turgor, no notable skin lesions seen Psychiatry: Mood normal // no visual hallucinations   Data Reviewed: I have personally reviewed following labs and imaging studies  CBC: Recent Labs  Lab 06/09/21 0827 06/10/21 0420  WBC 14.7* 12.1*  NEUTROABS 12.5*  --   HGB 12.2 11.5*  HCT 38.0 36.9  MCV 83.9 85.6  PLT 332 568   Basic Metabolic Panel: Recent Labs  Lab 06/09/21 0827 06/10/21 0420  NA 139 136  K 3.7 3.0*  CL 104 105  CO2 21* 22  GLUCOSE 147* 125*  BUN 18 20  CREATININE 0.70 0.73  CALCIUM 9.3 7.8*   GFR: Estimated Creatinine Clearance: 50.7 mL/min (by C-G formula based on SCr of 0.73  mg/dL). Liver Function Tests: Recent Labs  Lab 06/09/21 0827 06/10/21 0420  AST 14* 18  ALT 11 17  ALKPHOS 45 43  BILITOT 1.0 1.2  PROT 7.2 7.0  ALBUMIN 4.6 4.2   Recent Labs  Lab 06/09/21 0827  LIPASE 36   No results for input(s): AMMONIA in the last 168 hours. Coagulation Profile: No results for input(s): INR, PROTIME in the last 168 hours. Cardiac Enzymes: No results for input(s): CKTOTAL, CKMB, CKMBINDEX, TROPONINI in the last 168 hours. BNP (last 3 results) No results for input(s): PROBNP in the last 8760 hours. HbA1C: No results for  input(s): HGBA1C in the last 72 hours. CBG: No results for input(s): GLUCAP in the last 168 hours. Lipid Profile: No results for input(s): CHOL, HDL, LDLCALC, TRIG, CHOLHDL, LDLDIRECT in the last 72 hours. Thyroid Function Tests: No results for input(s): TSH, T4TOTAL, FREET4, T3FREE, THYROIDAB in the last 72 hours. Anemia Panel: No results for input(s): VITAMINB12, FOLATE, FERRITIN, TIBC, IRON, RETICCTPCT in the last 72 hours. Sepsis Labs: No results for input(s): PROCALCITON, LATICACIDVEN in the last 168 hours.  Recent Results (from the past 240 hour(s))  Resp Panel by RT-PCR (Flu A&B, Covid) Nasopharyngeal Swab     Status: None   Collection Time: 06/09/21  8:37 AM   Specimen: Nasopharyngeal Swab; Nasopharyngeal(NP) swabs in vial transport medium  Result Value Ref Range Status   SARS Coronavirus 2 by RT PCR NEGATIVE NEGATIVE Final    Comment: (NOTE) SARS-CoV-2 target nucleic acids are NOT DETECTED.  The SARS-CoV-2 RNA is generally detectable in upper respiratory specimens during the acute phase of infection. The lowest concentration of SARS-CoV-2 viral copies this assay can detect is 138 copies/mL. A negative result does not preclude SARS-Cov-2 infection and should not be used as the sole basis for treatment or other patient management decisions. A negative result may occur with  improper specimen collection/handling, submission of specimen other than nasopharyngeal swab, presence of viral mutation(s) within the areas targeted by this assay, and inadequate number of viral copies(<138 copies/mL). A negative result must be combined with clinical observations, patient history, and epidemiological information. The expected result is Negative.  Fact Sheet for Patients:  EntrepreneurPulse.com.au  Fact Sheet for Healthcare Providers:  IncredibleEmployment.be  This test is no t yet approved or cleared by the [place] FDA and  has been  authorized for detection and/or diagnosis of SARS-CoV-2 by FDA under an Emergency Use Authorization (EUA). This EUA will remain  in effect (meaning this test can be used) for the duration of the COVID-19 declaration under Section 564(b)(1) of the Act, 21 U.S.C.section 360bbb-3(b)(1), unless the authorization is terminated  or revoked sooner.       Influenza A by PCR NEGATIVE NEGATIVE Final   Influenza B by PCR NEGATIVE NEGATIVE Final    Comment: (NOTE) The Xpert Xpress SARS-CoV-2/FLU/RSV plus assay is intended as an aid in the diagnosis of influenza from Nasopharyngeal swab specimens and should not be used as a sole basis for treatment. Nasal washings and aspirates are unacceptable for Xpert Xpress SARS-CoV-2/FLU/RSV testing.  Fact Sheet for Patients: EntrepreneurPulse.com.au  Fact Sheet for Healthcare Providers: IncredibleEmployment.be  This test is not yet approved or cleared by the [place] FDA and has been authorized for detection and/or diagnosis of SARS-CoV-2 by FDA under an Emergency Use Authorization (EUA). This EUA will remain in effect (meaning this test can be used) for the duration of the COVID-19 declaration under Section 564(b)(1) of the Act, 21 U.S.C. section 360bbb-3(b)(1),  unless the authorization is terminated or revoked.  Performed at [facility], [address] [place], [place] [address]      Radiology Studies: No results found.  Scheduled Meds:  citalopram  20 mg Oral Daily   enoxaparin (LOVENOX) injection  40 mg Subcutaneous G40N   folic acid  0.5 mg Oral Daily   levothyroxine  75 mcg Oral Q0600   mometasone-formoterol  2 puff Inhalation BID   montelukast  10 mg Oral QHS   rosuvastatin  5 mg Oral QODAY   Continuous Infusions:  sodium chloride 100 mL/hr at 06/11/21 1041   piperacillin-tazobactam (ZOSYN)  IV 3.375 g (06/11/21 1403)   promethazine (PHENERGAN) injection (IM or IVPB)        LOS: 1 day   Marylu Lund, MD [facility] Pager On Amion  If 7PM-7AM, please contact night-coverage 06/11/2021, 2:49 PM

## 2021-06-12 LAB — COMPREHENSIVE METABOLIC PANEL
ALT: 17 U/L (ref 0–44)
AST: 18 U/L (ref 15–41)
Albumin: 3.7 g/dL (ref 3.5–5.0)
Alkaline Phosphatase: 35 U/L — ABNORMAL LOW (ref 38–126)
Anion gap: 11 (ref 5–15)
BUN: 15 mg/dL (ref 8–23)
CO2: 18 mmol/L — ABNORMAL LOW (ref 22–32)
Calcium: 6.5 mg/dL — ABNORMAL LOW (ref 8.9–10.3)
Chloride: 108 mmol/L (ref 98–111)
Creatinine, Ser: 0.67 mg/dL (ref 0.44–1.00)
GFR, Estimated: >60 mL/min (ref >60)
Glucose, Bld: 59 mg/dL — ABNORMAL LOW (ref 70–99)
Potassium: 3.3 mmol/L — ABNORMAL LOW (ref 3.5–5.1)
Sodium: 137 mmol/L (ref 135–145)
Total Bilirubin: 1.3 mg/dL — ABNORMAL HIGH (ref 0.3–1.2)
Total Protein: 6.5 g/dL (ref 6.5–8.1)

## 2021-06-12 NOTE — Progress Notes (Signed)
PROGRESS NOTE    [name]  FUX:[id number] DOB: [birth year] DOA: 06/09/2021 PCP: Deland Pretty, MD    Brief Narrative:  75 y.o. female past medical history significant for hypothyroidism, rheumatoid arthritis comes in for abdominal pain that started the day prior to admission with epigastric and right lower quadrant CT scan of the abdomen and pelvis in the ED showed acute diverticulitis was started on Zosyn.  Assessment & Plan:   Principal Problem:   Diverticulitis Active Problems:   Acute diverticulitis  Sepsis due to acute Diverticulitis present on admit Has been continued on IV zosyn WBC trended down to 12.1k  11/29 from 14.7k Lower quadrant pain is now improving, will begin trial of clears Pt is reporting abd distension over epigastric region. Abd is mildly distended, tympanic Pos BS Encourage ambulation as tolerated   Hypothyroidism: Continue Synthroid as tolerated Most recent TSH from 2012 of 3.702  Anxiety:  Continue citalopram and Xanax   Rheumatoid arthritis/osteoarthritis: Methotrexate remains on hold for now.  Cough variant asthma: Continue current home regimen. On minimal O2 support, no audible wheezintg   DVT prophylaxis: Lovenox subq Code Status: Partial Family Communication: Pt in room, family not at bedside  Status is: Inpatient  Remains inpatient appropriate because: Severity of illness, remains NPO   Consultants:    Procedures:    Antimicrobials: Anti-infectives (From admission, onward)    Start     Dose/Rate Route Frequency Ordered Stop   06/09/21 2200  piperacillin-tazobactam (ZOSYN) IVPB 3.375 g        3.375 g 12.5 mL/hr over 240 Minutes Intravenous Every 8 hours 06/09/21 1335     06/09/21 1345  piperacillin-tazobactam (ZOSYN) IVPB 3.375 g        3.375 g 100 mL/hr over 30 Minutes Intravenous  Once 06/09/21 1335 06/09/21 1426       Subjective: Reports continued abd discomfort, mainly in epigastric region. Lower quadrant  discomfort improving  Objective: Vitals:   06/11/21 1251 06/11/21 2144 06/12/21 0542 06/12/21 1216  BP: (!) 159/94 (!) 147/82 (!) 150/82 121/76  Pulse: 85 93 94 84  Resp: 18 18 19 16   Temp: 98 F (36.7 C) 98.7 F (37.1 C) 98.8 F (37.1 C) 97.6 F (36.4 C)  TempSrc:  Oral Oral Oral  SpO2: 96% 94% 95% 91%  Weight:      Height:        Intake/Output Summary (Last 24 hours) at 06/12/2021 1625 Last data filed at 06/12/2021 1400 Gross per 24 hour  Intake 1562.11 ml  Output 150 ml  Net 1412.11 ml    Filed Weights   06/09/21 0827  Weight: 59.9 kg    Examination: General exam: Conversant, in no acute distress Respiratory system: normal chest rise, clear, no audible wheezing Cardiovascular system: regular rhythm, s1-s2 Gastrointestinal system: Mildly distended, nontender, pos BS Central nervous system: No seizures, no tremors Extremities: No cyanosis, no joint deformities Skin: No rashes, no pallor Psychiatry: Affect normal // no auditory hallucinations   Data Reviewed: I have personally reviewed following labs and imaging studies  CBC: Recent Labs  Lab 06/09/21 0827 06/10/21 0420  WBC 14.7* 12.1*  NEUTROABS 12.5*  --   HGB 12.2 11.5*  HCT 38.0 36.9  MCV 83.9 85.6  PLT 332 025    Basic Metabolic Panel: Recent Labs  Lab 06/09/21 0827 06/10/21 0420 06/12/21 0404  NA 139 136 137  K 3.7 3.0* 3.3*  CL 104 105 108  CO2 21* 22 18*  GLUCOSE 147* 125* 59*  BUN 18 20 15   CREATININE 0.70 0.73 0.67  CALCIUM 9.3 7.8* 6.5*    GFR: Estimated Creatinine Clearance: 50.7 mL/min (by C-G formula based on SCr of 0.67 mg/dL). Liver Function Tests: Recent Labs  Lab 06/09/21 0827 06/10/21 0420 06/12/21 0404  AST 14* 18 18  ALT 11 17 17   ALKPHOS 45 43 35*  BILITOT 1.0 1.2 1.3*  PROT 7.2 7.0 6.5  ALBUMIN 4.6 4.2 3.7    Recent Labs  Lab 06/09/21 0827  LIPASE 36    No results for input(s): AMMONIA in the last 168 hours. Coagulation Profile: No results for  input(s): INR, PROTIME in the last 168 hours. Cardiac Enzymes: No results for input(s): CKTOTAL, CKMB, CKMBINDEX, TROPONINI in the last 168 hours. BNP (last 3 results) No results for input(s): PROBNP in the last 8760 hours. HbA1C: No results for input(s): HGBA1C in the last 72 hours. CBG: No results for input(s): GLUCAP in the last 168 hours. Lipid Profile: No results for input(s): CHOL, HDL, LDLCALC, TRIG, CHOLHDL, LDLDIRECT in the last 72 hours. Thyroid Function Tests: No results for input(s): TSH, T4TOTAL, FREET4, T3FREE, THYROIDAB in the last 72 hours. Anemia Panel: No results for input(s): VITAMINB12, FOLATE, FERRITIN, TIBC, IRON, RETICCTPCT in the last 72 hours. Sepsis Labs: No results for input(s): PROCALCITON, LATICACIDVEN in the last 168 hours.  Recent Results (from the past 240 hour(s))  Resp Panel by RT-PCR (Flu A&B, Covid) Nasopharyngeal Swab     Status: None   Collection Time: 06/09/21  8:37 AM   Specimen: Nasopharyngeal Swab; Nasopharyngeal(NP) swabs in vial transport medium  Result Value Ref Range Status   SARS Coronavirus 2 by RT PCR NEGATIVE NEGATIVE Final    Comment: (NOTE) SARS-CoV-2 target nucleic acids are NOT DETECTED.  The SARS-CoV-2 RNA is generally detectable in upper respiratory specimens during the acute phase of infection. The lowest concentration of SARS-CoV-2 viral copies this assay can detect is 138 copies/mL. A negative result does not preclude SARS-Cov-2 infection and should not be used as the sole basis for treatment or other patient management decisions. A negative result may occur with  improper specimen collection/handling, submission of specimen other than nasopharyngeal swab, presence of viral mutation(s) within the areas targeted by this assay, and inadequate number of viral copies(<138 copies/mL). A negative result must be combined with clinical observations, patient history, and epidemiological information. The expected result is  Negative.  Fact Sheet for Patients:  EntrepreneurPulse.com.au  Fact Sheet for Healthcare Providers:  IncredibleEmployment.be  This test is no t yet approved or cleared by the [place] FDA and  has been authorized for detection and/or diagnosis of SARS-CoV-2 by FDA under an Emergency Use Authorization (EUA). This EUA will remain  in effect (meaning this test can be used) for the duration of the COVID-19 declaration under Section 564(b)(1) of the Act, 21 U.S.C.section 360bbb-3(b)(1), unless the authorization is terminated  or revoked sooner.       Influenza A by PCR NEGATIVE NEGATIVE Final   Influenza B by PCR NEGATIVE NEGATIVE Final    Comment: (NOTE) The Xpert Xpress SARS-CoV-2/FLU/RSV plus assay is intended as an aid in the diagnosis of influenza from Nasopharyngeal swab specimens and should not be used as a sole basis for treatment. Nasal washings and aspirates are unacceptable for Xpert Xpress SARS-CoV-2/FLU/RSV testing.  Fact Sheet for Patients: EntrepreneurPulse.com.au  Fact Sheet for Healthcare Providers: IncredibleEmployment.be  This test is not yet approved or cleared by the [place] FDA and has been authorized for detection  and/or diagnosis of SARS-CoV-2 by FDA under an Emergency Use Authorization (EUA). This EUA will remain in effect (meaning this test can be used) for the duration of the COVID-19 declaration under Section 564(b)(1) of the Act, 21 U.S.C. section 360bbb-3(b)(1), unless the authorization is terminated or revoked.  Performed at [facility], [address] [place], [place] [address]       Radiology Studies: No results found.  Scheduled Meds:  citalopram  20 mg Oral Daily   enoxaparin (LOVENOX) injection  40 mg Subcutaneous G62I   folic acid  0.5 mg Oral Daily   levothyroxine  75 mcg Oral Q0600   mometasone-formoterol  2 puff Inhalation BID    montelukast  10 mg Oral QHS   rosuvastatin  5 mg Oral QODAY   Continuous Infusions:  sodium chloride 100 mL/hr at 06/12/21 1311   piperacillin-tazobactam (ZOSYN)  IV 3.375 g (06/12/21 1311)   promethazine (PHENERGAN) injection (IM or IVPB)       LOS: 2 days   Marylu Lund, MD [facility] Pager On Amion  If 7PM-7AM, please contact night-coverage 06/12/2021, 4:25 PM

## 2021-06-12 NOTE — Progress Notes (Signed)
Mobility Specialist - Progress Note    06/12/21 1107  Mobility  Activity Ambulated in hall  Level of Assistance Modified independent, requires aide device or extra time  Assistive Device Other (Comment) (IV Pole)  Distance Ambulated (ft) 225 ft  Mobility Ambulated independently in hallway  Mobility Response Tolerated well  Mobility performed by Mobility specialist  $Mobility charge 1 Mobility   Pt agreeable to mobilize this morning and pushed IV pole while walking 225 ft in hallway. No complaints made during session. Pt returned to room after ambulating and was left sitting EOB and call bell at side.   [place] Specialist Acute Rehabilitation Services Phone: [phone] 06/12/21, 11:09 AM

## 2021-06-13 LAB — COMPREHENSIVE METABOLIC PANEL
ALT: 17 U/L (ref 0–44)
AST: 18 U/L (ref 15–41)
Albumin: 3.6 g/dL (ref 3.5–5.0)
Alkaline Phosphatase: 35 U/L — ABNORMAL LOW (ref 38–126)
Anion gap: 10 (ref 5–15)
BUN: 10 mg/dL (ref 8–23)
CO2: 17 mmol/L — ABNORMAL LOW (ref 22–32)
Calcium: 6.2 mg/dL — CL (ref 8.9–10.3)
Chloride: 110 mmol/L (ref 98–111)
Creatinine, Ser: 0.54 mg/dL (ref 0.44–1.00)
GFR, Estimated: >60 mL/min (ref >60)
Glucose, Bld: 73 mg/dL (ref 70–99)
Potassium: 3.2 mmol/L — ABNORMAL LOW (ref 3.5–5.1)
Sodium: 137 mmol/L (ref 135–145)
Total Bilirubin: 1 mg/dL (ref 0.3–1.2)
Total Protein: 6.2 g/dL — ABNORMAL LOW (ref 6.5–8.1)

## 2021-06-13 LAB — CBC
HCT: 34.1 % — ABNORMAL LOW (ref 36.0–46.0)
Hemoglobin: 10.6 g/dL — ABNORMAL LOW (ref 12.0–15.0)
MCH: 26.7 pg (ref 26.0–34.0)
MCHC: 31.1 g/dL (ref 30.0–36.0)
MCV: 85.9 fL (ref 80.0–100.0)
Platelets: 211 10*3/uL (ref 150–400)
RBC: 3.97 MIL/uL (ref 3.87–5.11)
RDW: 15.9 % — ABNORMAL HIGH (ref 11.5–15.5)
WBC: 7.8 10*3/uL (ref 4.0–10.5)
nRBC: 0 % (ref 0.0–0.2)

## 2021-06-13 MED ORDER — POTASSIUM CHLORIDE CRYS ER 20 MEQ PO TBCR
40.0000 meq | EXTENDED_RELEASE_TABLET | Freq: Two times a day (BID) | ORAL | Status: AC
  Administered 2021-06-13 (×2): 40 meq via ORAL
  Filled 2021-06-13 (×2): qty 2

## 2021-06-13 MED ORDER — CALCIUM GLUCONATE-NACL 1-0.675 GM/50ML-% IV SOLN
1.0000 g | Freq: Once | INTRAVENOUS | Status: AC
  Administered 2021-06-13: 1000 mg via INTRAVENOUS
  Filled 2021-06-13: qty 50

## 2021-06-13 NOTE — Progress Notes (Signed)
   06/13/21 1352  Mobility  Activity Ambulated in hall  Level of Assistance Standby assist, set-up cues, supervision of patient - no hands on  Assistive Device Other (Comment) (IV pole)  Distance Ambulated (ft) 400 ft  Mobility Ambulated with assistance in hallway  Mobility Response Tolerated well  Mobility performed by Mobility specialist  $Mobility charge 1 Mobility   Pt agreeable to mobilizing this afternoon. Ambulated about 461ft in hall with IV pole. No complaints. Took 2 standing rest breaks during session. Left pt in bed with call bell at side.   [place] Specialist Acute Rehab Services Office: [phone]

## 2021-06-13 NOTE — Care Management Important Message (Signed)
Important Message  Patient Details IM Letter given to the Patient. Name: [name] MRN: [id number] Date of Birth: September 04, 1945   Medicare Important Message Given:  Yes     Kerin Salen 06/13/2021, 11:52 AM

## 2021-06-13 NOTE — Progress Notes (Addendum)
PROGRESS NOTE    [name]  WLS:[id number] DOB: [birth year] DOA: 06/09/2021 PCP: Deland Pretty, MD    Brief Narrative:  75 y.o. female past medical history significant for hypothyroidism, rheumatoid arthritis comes in for abdominal pain that started the day prior to admission with epigastric and right lower quadrant CT scan of the abdomen and pelvis in the ED showed acute diverticulitis was started on Zosyn.  Assessment & Plan:   Principal Problem:   Diverticulitis Active Problems:   Acute diverticulitis  Sepsis due to acute Diverticulitis present on admit Has been continued on IV zosyn WBC trended down to 7.8  from peak of 14.7k Lower quadrant pain continues to improve Pt still complains of epigastric pain, improved with BM and passing flatus overnight Pos BS Encourage ambulation as tolerated   Hypothyroidism: Continue Synthroid as tolerated Most recent TSH from 2012 of 3.702  Anxiety:  Continue citalopram and Xanax as tolerated   Rheumatoid arthritis/osteoarthritis: Methotrexate remains on hold for now.  Cough variant asthma: Continue current home regimen. On minimal O2 support, no wheezing auscultated  Hypocalcemia -Ca noted to be 6.2 today -Will give IV Ca, repeat lytes in AM -Will check PTH   DVT prophylaxis: Lovenox subq Code Status: Partial Family Communication: Pt in room, family not at bedside  Status is: Inpatient  Remains inpatient appropriate because: Severity of illness, remains NPO   Consultants:    Procedures:    Antimicrobials: Anti-infectives (From admission, onward)    Start     Dose/Rate Route Frequency Ordered Stop   06/09/21 2200  piperacillin-tazobactam (ZOSYN) IVPB 3.375 g        3.375 g 12.5 mL/hr over 240 Minutes Intravenous Every 8 hours 06/09/21 1335     06/09/21 1345  piperacillin-tazobactam (ZOSYN) IVPB 3.375 g        3.375 g 100 mL/hr over 30 Minutes Intravenous  Once 06/09/21 1335 06/09/21 1426        Subjective: Still reporting epigastric pain. Lower quadrant discomfort improved. Also reports feeling anxious overnight  Objective: Vitals:   06/12/21 1216 06/12/21 2153 06/13/21 0535 06/13/21 1317  BP: 121/76 (!) 153/89 (!) 147/70 139/81  Pulse: 84 83 80 92  Resp: 16  18 18   Temp: 97.6 F (36.4 C) (!) 97.5 F (36.4 C) 97.8 F (36.6 C) 97.7 F (36.5 C)  TempSrc: Oral Oral Oral Oral  SpO2: 91% 97% 97% 99%  Weight:      Height:        Intake/Output Summary (Last 24 hours) at 06/13/2021 1547 Last data filed at 06/13/2021 1502 Gross per 24 hour  Intake 3859.76 ml  Output 0 ml  Net 3859.76 ml    Filed Weights   06/09/21 0827  Weight: 59.9 kg    Examination: General exam: Awake, laying in bed, in nad Respiratory system: Normal respiratory effort, no wheezing Cardiovascular system: regular rate, s1, s2 Gastrointestinal system: Soft, nondistended, positive BS Central nervous system: CN2-12 grossly intact, strength intact Extremities: Perfused, no clubbing Skin: Normal skin turgor, no notable skin lesions seen Psychiatry: Mood normal // no visual hallucinations   Data Reviewed: I have personally reviewed following labs and imaging studies  CBC: Recent Labs  Lab 06/09/21 0827 06/10/21 0420 06/13/21 0854  WBC 14.7* 12.1* 7.8  NEUTROABS 12.5*  --   --   HGB 12.2 11.5* 10.6*  HCT 38.0 36.9 34.1*  MCV 83.9 85.6 85.9  PLT 332 282 811    Basic Metabolic Panel: Recent Labs  Lab 06/09/21 0827  06/10/21 0420 06/12/21 0404 06/13/21 0854  NA 139 136 137 137  K 3.7 3.0* 3.3* 3.2*  CL 104 105 108 110  CO2 21* 22 18* 17*  GLUCOSE 147* 125* 59* 73  BUN 18 20 15 10   CREATININE 0.70 0.73 0.67 0.54  CALCIUM 9.3 7.8* 6.5* 6.2*    GFR: Estimated Creatinine Clearance: 50.7 mL/min (by C-G formula based on SCr of 0.54 mg/dL). Liver Function Tests: Recent Labs  Lab 06/09/21 0827 06/10/21 0420 06/12/21 0404 06/13/21 0854  AST 14* 18 18 18   ALT 11 17 17 17    ALKPHOS 45 43 35* 35*  BILITOT 1.0 1.2 1.3* 1.0  PROT 7.2 7.0 6.5 6.2*  ALBUMIN 4.6 4.2 3.7 3.6    Recent Labs  Lab 06/09/21 0827  LIPASE 36    No results for input(s): AMMONIA in the last 168 hours. Coagulation Profile: No results for input(s): INR, PROTIME in the last 168 hours. Cardiac Enzymes: No results for input(s): CKTOTAL, CKMB, CKMBINDEX, TROPONINI in the last 168 hours. BNP (last 3 results) No results for input(s): PROBNP in the last 8760 hours. HbA1C: No results for input(s): HGBA1C in the last 72 hours. CBG: No results for input(s): GLUCAP in the last 168 hours. Lipid Profile: No results for input(s): CHOL, HDL, LDLCALC, TRIG, CHOLHDL, LDLDIRECT in the last 72 hours. Thyroid Function Tests: No results for input(s): TSH, T4TOTAL, FREET4, T3FREE, THYROIDAB in the last 72 hours. Anemia Panel: No results for input(s): VITAMINB12, FOLATE, FERRITIN, TIBC, IRON, RETICCTPCT in the last 72 hours. Sepsis Labs: No results for input(s): PROCALCITON, LATICACIDVEN in the last 168 hours.  Recent Results (from the past 240 hour(s))  Resp Panel by RT-PCR (Flu A&B, Covid) Nasopharyngeal Swab     Status: None   Collection Time: 06/09/21  8:37 AM   Specimen: Nasopharyngeal Swab; Nasopharyngeal(NP) swabs in vial transport medium  Result Value Ref Range Status   SARS Coronavirus 2 by RT PCR NEGATIVE NEGATIVE Final    Comment: (NOTE) SARS-CoV-2 target nucleic acids are NOT DETECTED.  The SARS-CoV-2 RNA is generally detectable in upper respiratory specimens during the acute phase of infection. The lowest concentration of SARS-CoV-2 viral copies this assay can detect is 138 copies/mL. A negative result does not preclude SARS-Cov-2 infection and should not be used as the sole basis for treatment or other patient management decisions. A negative result may occur with  improper specimen collection/handling, submission of specimen other than nasopharyngeal swab, presence of viral  mutation(s) within the areas targeted by this assay, and inadequate number of viral copies(<138 copies/mL). A negative result must be combined with clinical observations, patient history, and epidemiological information. The expected result is Negative.  Fact Sheet for Patients:  EntrepreneurPulse.com.au  Fact Sheet for Healthcare Providers:  IncredibleEmployment.be  This test is no t yet approved or cleared by the [place] FDA and  has been authorized for detection and/or diagnosis of SARS-CoV-2 by FDA under an Emergency Use Authorization (EUA). This EUA will remain  in effect (meaning this test can be used) for the duration of the COVID-19 declaration under Section 564(b)(1) of the Act, 21 U.S.C.section 360bbb-3(b)(1), unless the authorization is terminated  or revoked sooner.       Influenza A by PCR NEGATIVE NEGATIVE Final   Influenza B by PCR NEGATIVE NEGATIVE Final    Comment: (NOTE) The Xpert Xpress SARS-CoV-2/FLU/RSV plus assay is intended as an aid in the diagnosis of influenza from Nasopharyngeal swab specimens and should not be used as a  sole basis for treatment. Nasal washings and aspirates are unacceptable for Xpert Xpress SARS-CoV-2/FLU/RSV testing.  Fact Sheet for Patients: EntrepreneurPulse.com.au  Fact Sheet for Healthcare Providers: IncredibleEmployment.be  This test is not yet approved or cleared by the [place] FDA and has been authorized for detection and/or diagnosis of SARS-CoV-2 by FDA under an Emergency Use Authorization (EUA). This EUA will remain in effect (meaning this test can be used) for the duration of the COVID-19 declaration under Section 564(b)(1) of the Act, 21 U.S.C. section 360bbb-3(b)(1), unless the authorization is terminated or revoked.  Performed at [facility], [address], [place], [place] [address]       Radiology  Studies: No results found.  Scheduled Meds:  citalopram  20 mg Oral Daily   enoxaparin (LOVENOX) injection  40 mg Subcutaneous Z12O   folic acid  0.5 mg Oral Daily   levothyroxine  75 mcg Oral Q0600   mometasone-formoterol  2 puff Inhalation BID   montelukast  10 mg Oral QHS   potassium chloride  40 mEq Oral BID   rosuvastatin  5 mg Oral QODAY   Continuous Infusions:  sodium chloride 100 mL/hr at 06/12/21 1311   piperacillin-tazobactam (ZOSYN)  IV 3.375 g (06/13/21 1358)   promethazine (PHENERGAN) injection (IM or IVPB)       LOS: 3 days   Marylu Lund, MD [facility] Pager On Amion  If 7PM-7AM, please contact night-coverage 06/13/2021, 3:47 PM

## 2021-06-14 LAB — COMPREHENSIVE METABOLIC PANEL
ALT: 18 U/L (ref 0–44)
AST: 21 U/L (ref 15–41)
Albumin: 3.4 g/dL — ABNORMAL LOW (ref 3.5–5.0)
Alkaline Phosphatase: 33 U/L — ABNORMAL LOW (ref 38–126)
Anion gap: 2 — ABNORMAL LOW (ref 5–15)
BUN: <5 mg/dL — ABNORMAL LOW (ref 8–23)
CO2: 22 mmol/L (ref 22–32)
Calcium: 6.6 mg/dL — ABNORMAL LOW (ref 8.9–10.3)
Chloride: 115 mmol/L — ABNORMAL HIGH (ref 98–111)
Creatinine, Ser: 0.63 mg/dL (ref 0.44–1.00)
GFR, Estimated: >60 mL/min (ref >60)
Glucose, Bld: 90 mg/dL (ref 70–99)
Potassium: 3.8 mmol/L (ref 3.5–5.1)
Sodium: 139 mmol/L (ref 135–145)
Total Bilirubin: 0.9 mg/dL (ref 0.3–1.2)
Total Protein: 5.8 g/dL — ABNORMAL LOW (ref 6.5–8.1)

## 2021-06-14 LAB — PTH, INTACT AND CALCIUM
Calcium, Total (PTH): 6.1 mg/dL (ref 8.7–10.3)
PTH: 76 pg/mL — ABNORMAL HIGH (ref 15–65)

## 2021-06-14 LAB — CBC
HCT: 33 % — ABNORMAL LOW (ref 36.0–46.0)
Hemoglobin: 10.6 g/dL — ABNORMAL LOW (ref 12.0–15.0)
MCH: 27 pg (ref 26.0–34.0)
MCHC: 32.1 g/dL (ref 30.0–36.0)
MCV: 84 fL (ref 80.0–100.0)
Platelets: 222 10*3/uL (ref 150–400)
RBC: 3.93 MIL/uL (ref 3.87–5.11)
RDW: 16.3 % — ABNORMAL HIGH (ref 11.5–15.5)
WBC: 8.5 10*3/uL (ref 4.0–10.5)
nRBC: 0 % (ref 0.0–0.2)

## 2021-06-14 MED ORDER — CALCIUM GLUCONATE-NACL 1-0.675 GM/50ML-% IV SOLN
1.0000 g | Freq: Once | INTRAVENOUS | Status: AC
  Administered 2021-06-14: 1000 mg via INTRAVENOUS
  Filled 2021-06-14: qty 50

## 2021-06-14 MED ORDER — SIMETHICONE 40 MG/0.6ML PO SUSP
40.0000 mg | Freq: Four times a day (QID) | ORAL | Status: DC | PRN
  Filled 2021-06-14: qty 0.6

## 2021-06-14 NOTE — Progress Notes (Signed)
PROGRESS NOTE    [name]  OZD:[id number] DOB: 1945-12-04 DOA: 06/09/2021 PCP: Deland Pretty, MD    Brief Narrative:  75 y.o. female past medical history significant for hypothyroidism, rheumatoid arthritis comes in for abdominal pain that started the day prior to admission with epigastric and right lower quadrant CT scan of the abdomen and pelvis in the ED showed acute diverticulitis was started on Zosyn.  Assessment & Plan:   Principal Problem:   Diverticulitis Active Problems:   Acute diverticulitis  Sepsis due to acute Diverticulitis present on admit Has been continued on IV zosyn WBC stable at 8.5, down from peak of 14.7k Lower quadrant pain continues to improve Still complaining of gas pains, but is improving Encourage ambulation as tolerated Will advance diet as tolerated   Hypothyroidism: Continue Synthroid as tolerated Most recent TSH from 2012 of 3.702  Anxiety:  Continue citalopram and Xanax as tolerated   Rheumatoid arthritis/osteoarthritis: Methotrexate remains on hold for now.  Cough variant asthma: Continue current home regimen. On minimal O2 support, no wheezing auscultated  Hypocalcemia -Ca noted to be 6.6 today -Will give IV Ca, repeat lytes in AM -PTH pending   DVT prophylaxis: Lovenox subq Code Status: Partial Family Communication: Pt in room, family not at bedside  Status is: Inpatient  Remains inpatient appropriate because: Severity of illness, remains NPO   Consultants:    Procedures:    Antimicrobials: Anti-infectives (From admission, onward)    Start     Dose/Rate Route Frequency Ordered Stop   06/09/21 2200  piperacillin-tazobactam (ZOSYN) IVPB 3.375 g        3.375 g 12.5 mL/hr over 240 Minutes Intravenous Every 8 hours 06/09/21 1335     06/09/21 1345  piperacillin-tazobactam (ZOSYN) IVPB 3.375 g        3.375 g 100 mL/hr over 30 Minutes Intravenous  Once 06/09/21 1335 06/09/21 1426       Subjective: Still with  epigastric discomfort, improving  Objective: Vitals:   06/13/21 0535 06/13/21 1317 06/13/21 2125 06/14/21 0543  BP: (!) 147/70 139/81 (!) 142/94 (!) 146/80  Pulse: 80 92 85 98  Resp: 18 18 18    Temp: 97.8 F (36.6 C) 97.7 F (36.5 C) 98.1 F (36.7 C) 98 F (36.7 C)  TempSrc: Oral Oral Oral Oral  SpO2: 97% 99% 99% 98%  Weight:      Height:        Intake/Output Summary (Last 24 hours) at 06/14/2021 1422 Last data filed at 06/14/2021 1000 Gross per 24 hour  Intake 3067.24 ml  Output 0 ml  Net 3067.24 ml    Filed Weights   06/09/21 0827  Weight: 59.9 kg    Examination: General exam: Conversant, in no acute distress Respiratory system: normal chest rise, clear, no audible wheezing Cardiovascular system: regular rhythm, s1-s2 Gastrointestinal system: Nondistended, nontender, pos BS Central nervous system: No seizures, no tremors Extremities: No cyanosis, no joint deformities Skin: No rashes, no pallor Psychiatry: Affect normal // no auditory hallucinations   Data Reviewed: I have personally reviewed following labs and imaging studies  CBC: Recent Labs  Lab 06/09/21 0827 06/10/21 0420 06/13/21 0854 06/14/21 0518  WBC 14.7* 12.1* 7.8 8.5  NEUTROABS 12.5*  --   --   --   HGB 12.2 11.5* 10.6* 10.6*  HCT 38.0 36.9 34.1* 33.0*  MCV 83.9 85.6 85.9 84.0  PLT 332 282 211 259    Basic Metabolic Panel: Recent Labs  Lab 06/09/21 0827 06/10/21 0420 06/12/21 0404 06/13/21  1443 06/14/21 0518  NA 139 136 137 137 139  K 3.7 3.0* 3.3* 3.2* 3.8  CL 104 105 108 110 115*  CO2 21* 22 18* 17* 22  GLUCOSE 147* 125* 59* 73 90  BUN 18 20 15 10  <5*  CREATININE 0.70 0.73 0.67 0.54 0.63  CALCIUM 9.3 7.8* 6.5* 6.2*  6.1* 6.6*    GFR: Estimated Creatinine Clearance: 50.7 mL/min (by C-G formula based on SCr of 0.63 mg/dL). Liver Function Tests: Recent Labs  Lab 06/09/21 0827 06/10/21 0420 06/12/21 0404 06/13/21 0854 06/14/21 0518  AST 14* 18 18 18 21   ALT 11 17 17 17  18   ALKPHOS 45 43 35* 35* 33*  BILITOT 1.0 1.2 1.3* 1.0 0.9  PROT 7.2 7.0 6.5 6.2* 5.8*  ALBUMIN 4.6 4.2 3.7 3.6 3.4*    Recent Labs  Lab 06/09/21 0827  LIPASE 36    No results for input(s): AMMONIA in the last 168 hours. Coagulation Profile: No results for input(s): INR, PROTIME in the last 168 hours. Cardiac Enzymes: No results for input(s): CKTOTAL, CKMB, CKMBINDEX, TROPONINI in the last 168 hours. BNP (last 3 results) No results for input(s): PROBNP in the last 8760 hours. HbA1C: No results for input(s): HGBA1C in the last 72 hours. CBG: No results for input(s): GLUCAP in the last 168 hours. Lipid Profile: No results for input(s): CHOL, HDL, LDLCALC, TRIG, CHOLHDL, LDLDIRECT in the last 72 hours. Thyroid Function Tests: No results for input(s): TSH, T4TOTAL, FREET4, T3FREE, THYROIDAB in the last 72 hours. Anemia Panel: No results for input(s): VITAMINB12, FOLATE, FERRITIN, TIBC, IRON, RETICCTPCT in the last 72 hours. Sepsis Labs: No results for input(s): PROCALCITON, LATICACIDVEN in the last 168 hours.  Recent Results (from the past 240 hour(s))  Resp Panel by RT-PCR (Flu A&B, Covid) Nasopharyngeal Swab     Status: None   Collection Time: 06/09/21  8:37 AM   Specimen: Nasopharyngeal Swab; Nasopharyngeal(NP) swabs in vial transport medium  Result Value Ref Range Status   SARS Coronavirus 2 by RT PCR NEGATIVE NEGATIVE Final    Comment: (NOTE) SARS-CoV-2 target nucleic acids are NOT DETECTED.  The SARS-CoV-2 RNA is generally detectable in upper respiratory specimens during the acute phase of infection. The lowest concentration of SARS-CoV-2 viral copies this assay can detect is 138 copies/mL. A negative result does not preclude SARS-Cov-2 infection and should not be used as the sole basis for treatment or other patient management decisions. A negative result may occur with  improper specimen collection/handling, submission of specimen other than nasopharyngeal swab,  presence of viral mutation(s) within the areas targeted by this assay, and inadequate number of viral copies(<138 copies/mL). A negative result must be combined with clinical observations, patient history, and epidemiological information. The expected result is Negative.  Fact Sheet for Patients:  EntrepreneurPulse.com.au  Fact Sheet for Healthcare Providers:  IncredibleEmployment.be  This test is no t yet approved or cleared by the [place] FDA and  has been authorized for detection and/or diagnosis of SARS-CoV-2 by FDA under an Emergency Use Authorization (EUA). This EUA will remain  in effect (meaning this test can be used) for the duration of the COVID-19 declaration under Section 564(b)(1) of the Act, 21 U.S.C.section 360bbb-3(b)(1), unless the authorization is terminated  or revoked sooner.       Influenza A by PCR NEGATIVE NEGATIVE Final   Influenza B by PCR NEGATIVE NEGATIVE Final    Comment: (NOTE) The Xpert Xpress SARS-CoV-2/FLU/RSV plus assay is intended as an aid in  the diagnosis of influenza from Nasopharyngeal swab specimens and should not be used as a sole basis for treatment. Nasal washings and aspirates are unacceptable for Xpert Xpress SARS-CoV-2/FLU/RSV testing.  Fact Sheet for Patients: EntrepreneurPulse.com.au  Fact Sheet for Healthcare Providers: IncredibleEmployment.be  This test is not yet approved or cleared by the [place] FDA and has been authorized for detection and/or diagnosis of SARS-CoV-2 by FDA under an Emergency Use Authorization (EUA). This EUA will remain in effect (meaning this test can be used) for the duration of the COVID-19 declaration under Section 564(b)(1) of the Act, 21 U.S.C. section 360bbb-3(b)(1), unless the authorization is terminated or revoked.  Performed at [facility], [address] [place], [place] [address]        Radiology Studies: No results found.  Scheduled Meds:  citalopram  20 mg Oral Daily   enoxaparin (LOVENOX) injection  40 mg Subcutaneous E36O   folic acid  0.5 mg Oral Daily   levothyroxine  75 mcg Oral Q0600   mometasone-formoterol  2 puff Inhalation BID   montelukast  10 mg Oral QHS   rosuvastatin  5 mg Oral QODAY   Continuous Infusions:  sodium chloride 100 mL/hr at 06/14/21 0900   piperacillin-tazobactam (ZOSYN)  IV 3.375 g (06/14/21 1336)   promethazine (PHENERGAN) injection (IM or IVPB)       LOS: 4 days   Marylu Lund, MD [facility] Pager On Amion  If 7PM-7AM, please contact night-coverage 06/14/2021, 2:22 PM

## 2021-06-14 NOTE — Progress Notes (Signed)
Mobility Specialist - Progress Note    06/14/21 1056  Mobility  Activity Ambulated in hall  Level of Concepcion Other (Comment) (IV Pole)  Distance Ambulated (ft) 500 ft  Mobility Ambulated independently in hallway  Mobility Response Tolerated well  Mobility performed by Mobility specialist  $Mobility charge 1 Mobility  Upon entry pt was agreeable to mobilize and pushed IV pole while ambulaing 500 ft in hallway. No complains made during session. Pt returned to room after session and was left in bed with call bell at side.   [place] Specialist [facility] Phone: [phone] 06/14/21, 10:58 AM

## 2021-06-15 ENCOUNTER — Inpatient Hospital Stay (HOSPITAL_COMMUNITY): Payer: Medicare Other

## 2021-06-15 LAB — CBC
HCT: 32.2 % — ABNORMAL LOW (ref 36.0–46.0)
Hemoglobin: 10.3 g/dL — ABNORMAL LOW (ref 12.0–15.0)
MCH: 27.1 pg (ref 26.0–34.0)
MCHC: 32 g/dL (ref 30.0–36.0)
MCV: 84.7 fL (ref 80.0–100.0)
Platelets: 187 10*3/uL (ref 150–400)
RBC: 3.8 MIL/uL — ABNORMAL LOW (ref 3.87–5.11)
RDW: 16.7 % — ABNORMAL HIGH (ref 11.5–15.5)
WBC: 6.7 10*3/uL (ref 4.0–10.5)
nRBC: 0 % (ref 0.0–0.2)

## 2021-06-15 LAB — COMPREHENSIVE METABOLIC PANEL
ALT: 20 U/L (ref 0–44)
AST: 22 U/L (ref 15–41)
Albumin: 3.3 g/dL — ABNORMAL LOW (ref 3.5–5.0)
Alkaline Phosphatase: 30 U/L — ABNORMAL LOW (ref 38–126)
Anion gap: 5 (ref 5–15)
BUN: <5 mg/dL — ABNORMAL LOW (ref 8–23)
CO2: 24 mmol/L (ref 22–32)
Calcium: 7.2 mg/dL — ABNORMAL LOW (ref 8.9–10.3)
Chloride: 111 mmol/L (ref 98–111)
Creatinine, Ser: 0.66 mg/dL (ref 0.44–1.00)
GFR, Estimated: >60 mL/min (ref >60)
Glucose, Bld: 107 mg/dL — ABNORMAL HIGH (ref 70–99)
Potassium: 3.6 mmol/L (ref 3.5–5.1)
Sodium: 140 mmol/L (ref 135–145)
Total Bilirubin: 0.9 mg/dL (ref 0.3–1.2)
Total Protein: 5.7 g/dL — ABNORMAL LOW (ref 6.5–8.1)

## 2021-06-15 MED ORDER — PANTOPRAZOLE SODIUM 40 MG PO TBEC
40.0000 mg | DELAYED_RELEASE_TABLET | Freq: Every day | ORAL | Status: DC
  Administered 2021-06-15 – 2021-06-16 (×2): 40 mg via ORAL
  Filled 2021-06-15 (×2): qty 1

## 2021-06-15 MED ORDER — CALCIUM CARBONATE 1250 (500 CA) MG PO TABS
1.0000 | ORAL_TABLET | Freq: Two times a day (BID) | ORAL | Status: DC
  Administered 2021-06-15 – 2021-06-16 (×2): 500 mg via ORAL
  Filled 2021-06-15 (×2): qty 1

## 2021-06-15 NOTE — Progress Notes (Signed)
PROGRESS NOTE    [name]  OQH:[id number] DOB: [birth year] DOA: 06/09/2021 PCP: Deland Pretty, MD    Brief Narrative:  75 y.o. female past medical history significant for hypothyroidism, rheumatoid arthritis comes in for abdominal pain that started the day prior to admission with epigastric and right lower quadrant CT scan of the abdomen and pelvis in the ED showed acute diverticulitis was started on Zosyn.  Assessment & Plan:   Principal Problem:   Diverticulitis Active Problems:   Acute diverticulitis  Sepsis due to acute Diverticulitis present on admit Has been continued on IV zosyn, to complete one more day WBC stable at 6.7, down from peak of 14.7k Lower quadrant pain continues to improve Still complaining of epigastric pains, see below Encourage ambulation as tolerated Advance to soft diet   Hypothyroidism: Continue Synthroid as tolerated Most recent TSH from 2012 of 3.702  Anxiety:  Continue citalopram and Xanax as tolerated Seems stable at this time   Rheumatoid arthritis/osteoarthritis: Methotrexate continues to be on hold for now.  Cough variant asthma: Continue current home regimen. On minimal O2 support, no wheezing auscultated  Hypocalcemia -Ca noted to be 7.2 today -Given IV Ca this visit -continue on scheduled PO calcium -PTH elevated at 76 with Ca 6.1 -Will check vit D level  Epigastric pain -Persistent epigastric tenderness thus far this visit -Pt and family reports remote hx of "two ulcers" seen on EGD 5-50yrs ago. Pt known to [facility] -Ordered and reviewed abd xray, unremarkable with no evidence of obstruction -Had discussed with GI over phone who recommends trial of PPI. If no improvement, then trial of addition of carafate. If still no improvement, then formal GI eval    DVT prophylaxis: Lovenox subq Code Status: Partial Family Communication: Pt in room, family not at bedside  Status is: Inpatient  Remains inpatient  appropriate because: Severity of illness, remains NPO   Consultants:    Procedures:    Antimicrobials: Anti-infectives (From admission, onward)    Start     Dose/Rate Route Frequency Ordered Stop   06/09/21 2200  piperacillin-tazobactam (ZOSYN) IVPB 3.375 g        3.375 g 12.5 mL/hr over 240 Minutes Intravenous Every 8 hours 06/09/21 1335 06/16/21 2159   06/09/21 1345  piperacillin-tazobactam (ZOSYN) IVPB 3.375 g        3.375 g 100 mL/hr over 30 Minutes Intravenous  Once 06/09/21 1335 06/09/21 1426       Subjective: Complaining of epigastric pains  Objective: Vitals:   06/14/21 1438 06/14/21 2052 06/15/21 0455 06/15/21 1425  BP: (!) 149/80 (!) 145/80 137/70 136/76  Pulse: 84 91 79 90  Resp: 14 18 18  (!) 22  Temp: 98.5 F (36.9 C) 97.6 F (36.4 C) 97.7 F (36.5 C) 98.5 F (36.9 C)  TempSrc: Oral Oral Oral Oral  SpO2: 99% 98% 99% 97%  Weight:      Height:        Intake/Output Summary (Last 24 hours) at 06/15/2021 1518 Last data filed at 06/15/2021 1400 Gross per 24 hour  Intake 1872.52 ml  Output 0 ml  Net 1872.52 ml    Filed Weights   06/09/21 0827  Weight: 59.9 kg    Examination: General exam: Awake, laying in bed, in nad Respiratory system: Normal respiratory effort, no wheezing Cardiovascular system: regular rate, s1, s2 Gastrointestinal system: Soft, nondistended, positive BS Central nervous system: CN2-12 grossly intact, strength intact Extremities: Perfused, no clubbing Skin: Normal skin turgor, no notable skin lesions  seen Psychiatry: Mood normal // no visual hallucinations   Data Reviewed: I have personally reviewed following labs and imaging studies  CBC: Recent Labs  Lab 06/09/21 0827 06/10/21 0420 06/13/21 0854 06/14/21 0518 06/15/21 0440  WBC 14.7* 12.1* 7.8 8.5 6.7  NEUTROABS 12.5*  --   --   --   --   HGB 12.2 11.5* 10.6* 10.6* 10.3*  HCT 38.0 36.9 34.1* 33.0* 32.2*  MCV 83.9 85.6 85.9 84.0 84.7  PLT 332 282 211 222 187     Basic Metabolic Panel: Recent Labs  Lab 06/10/21 0420 06/12/21 0404 06/13/21 0854 06/14/21 0518 06/15/21 0440  NA 136 137 137 139 140  K 3.0* 3.3* 3.2* 3.8 3.6  CL 105 108 110 115* 111  CO2 22 18* 17* 22 24  GLUCOSE 125* 59* 73 90 107*  BUN 20 15 10  <5* <5*  CREATININE 0.73 0.67 0.54 0.63 0.66  CALCIUM 7.8* 6.5* 6.2*  6.1 6.6* 7.2*    GFR: Estimated Creatinine Clearance: 50.7 mL/min (by C-G formula based on SCr of 0.66 mg/dL). Liver Function Tests: Recent Labs  Lab 06/10/21 0420 06/12/21 0404 06/13/21 0854 06/14/21 0518 06/15/21 0440  AST 18 18 18 21 22   ALT 17 17 17 18 20   ALKPHOS 43 35* 35* 33* 30*  BILITOT 1.2 1.3* 1.0 0.9 0.9  PROT 7.0 6.5 6.2* 5.8* 5.7*  ALBUMIN 4.2 3.7 3.6 3.4* 3.3*    Recent Labs  Lab 06/09/21 0827  LIPASE 36    No results for input(s): AMMONIA in the last 168 hours. Coagulation Profile: No results for input(s): INR, PROTIME in the last 168 hours. Cardiac Enzymes: No results for input(s): CKTOTAL, CKMB, CKMBINDEX, TROPONINI in the last 168 hours. BNP (last 3 results) No results for input(s): PROBNP in the last 8760 hours. HbA1C: No results for input(s): HGBA1C in the last 72 hours. CBG: No results for input(s): GLUCAP in the last 168 hours. Lipid Profile: No results for input(s): CHOL, HDL, LDLCALC, TRIG, CHOLHDL, LDLDIRECT in the last 72 hours. Thyroid Function Tests: No results for input(s): TSH, T4TOTAL, FREET4, T3FREE, THYROIDAB in the last 72 hours. Anemia Panel: No results for input(s): VITAMINB12, FOLATE, FERRITIN, TIBC, IRON, RETICCTPCT in the last 72 hours. Sepsis Labs: No results for input(s): PROCALCITON, LATICACIDVEN in the last 168 hours.  Recent Results (from the past 240 hour(s))  Resp Panel by RT-PCR (Flu A&B, Covid) Nasopharyngeal Swab     Status: None   Collection Time: 06/09/21  8:37 AM   Specimen: Nasopharyngeal Swab; Nasopharyngeal(NP) swabs in vial transport medium  Result Value Ref Range Status    SARS Coronavirus 2 by RT PCR NEGATIVE NEGATIVE Final    Comment: (NOTE) SARS-CoV-2 target nucleic acids are NOT DETECTED.  The SARS-CoV-2 RNA is generally detectable in upper respiratory specimens during the acute phase of infection. The lowest concentration of SARS-CoV-2 viral copies this assay can detect is 138 copies/mL. A negative result does not preclude SARS-Cov-2 infection and should not be used as the sole basis for treatment or other patient management decisions. A negative result may occur with  improper specimen collection/handling, submission of specimen other than nasopharyngeal swab, presence of viral mutation(s) within the areas targeted by this assay, and inadequate number of viral copies(<138 copies/mL). A negative result must be combined with clinical observations, patient history, and epidemiological information. The expected result is Negative.  Fact Sheet for Patients:  EntrepreneurPulse.com.au  Fact Sheet for Healthcare Providers:  IncredibleEmployment.be  This test is no t yet  approved or cleared by the [place] and  has been authorized for detection and/or diagnosis of SARS-CoV-2 by FDA under an Emergency Use Authorization (EUA). This EUA will remain  in effect (meaning this test can be used) for the duration of the COVID-19 declaration under Section 564(b)(1) of the Act, 21 U.S.C.section 360bbb-3(b)(1), unless the authorization is terminated  or revoked sooner.       Influenza A by PCR NEGATIVE NEGATIVE Final   Influenza B by PCR NEGATIVE NEGATIVE Final    Comment: (NOTE) The Xpert Xpress SARS-CoV-2/FLU/RSV plus assay is intended as an aid in the diagnosis of influenza from Nasopharyngeal swab specimens and should not be used as a sole basis for treatment. Nasal washings and aspirates are unacceptable for Xpert Xpress SARS-CoV-2/FLU/RSV testing.  Fact Sheet for  Patients: EntrepreneurPulse.com.au  Fact Sheet for Healthcare Providers: IncredibleEmployment.be  This test is not yet approved or cleared by the [place] FDA and has been authorized for detection and/or diagnosis of SARS-CoV-2 by FDA under an Emergency Use Authorization (EUA). This EUA will remain in effect (meaning this test can be used) for the duration of the COVID-19 declaration under Section 564(b)(1) of the Act, 21 U.S.C. section 360bbb-3(b)(1), unless the authorization is terminated or revoked.  Performed at [facility], [address], [place], [place] [address]       Radiology Studies: No results found.  Scheduled Meds:  citalopram  20 mg Oral Daily   enoxaparin (LOVENOX) injection  40 mg Subcutaneous C94B   folic acid  0.5 mg Oral Daily   levothyroxine  75 mcg Oral Q0600   mometasone-formoterol  2 puff Inhalation BID   montelukast  10 mg Oral QHS   pantoprazole  40 mg Oral Daily   rosuvastatin  5 mg Oral QODAY   Continuous Infusions:  sodium chloride 100 mL/hr at 06/15/21 1310   piperacillin-tazobactam (ZOSYN)  IV 3.375 g (06/15/21 1310)   promethazine (PHENERGAN) injection (IM or IVPB)       LOS: 5 days   Marylu Lund, MD [facility] Pager On Amion  If 7PM-7AM, please contact night-coverage 06/15/2021, 3:18 PM

## 2021-06-16 LAB — MAGNESIUM: Magnesium: 1.6 mg/dL — ABNORMAL LOW (ref 1.7–2.4)

## 2021-06-16 LAB — COMPREHENSIVE METABOLIC PANEL
ALT: 21 U/L (ref 0–44)
AST: 22 U/L (ref 15–41)
Albumin: 3 g/dL — ABNORMAL LOW (ref 3.5–5.0)
Alkaline Phosphatase: 31 U/L — ABNORMAL LOW (ref 38–126)
Anion gap: 3 — ABNORMAL LOW (ref 5–15)
BUN: <5 mg/dL — ABNORMAL LOW (ref 8–23)
CO2: 25 mmol/L (ref 22–32)
Calcium: 6.8 mg/dL — ABNORMAL LOW (ref 8.9–10.3)
Chloride: 110 mmol/L (ref 98–111)
Creatinine, Ser: 0.67 mg/dL (ref 0.44–1.00)
GFR, Estimated: >60 mL/min (ref >60)
Glucose, Bld: 91 mg/dL (ref 70–99)
Potassium: 3.3 mmol/L — ABNORMAL LOW (ref 3.5–5.1)
Sodium: 138 mmol/L (ref 135–145)
Total Bilirubin: 0.7 mg/dL (ref 0.3–1.2)
Total Protein: 5.2 g/dL — ABNORMAL LOW (ref 6.5–8.1)

## 2021-06-16 LAB — VITAMIN D 25 HYDROXY (VIT D DEFICIENCY, FRACTURES): Vit D, 25-Hydroxy: 50.91 ng/mL (ref 30–100)

## 2021-06-16 MED ORDER — CALCIUM GLUCONATE-NACL 1-0.675 GM/50ML-% IV SOLN
1.0000 g | Freq: Once | INTRAVENOUS | Status: AC
  Administered 2021-06-16: 1000 mg via INTRAVENOUS
  Filled 2021-06-16: qty 50

## 2021-06-16 MED ORDER — MAGNESIUM SULFATE 4 GM/100ML IV SOLN
4.0000 g | Freq: Once | INTRAVENOUS | Status: AC
  Administered 2021-06-16: 4 g via INTRAVENOUS
  Filled 2021-06-16: qty 100

## 2021-06-16 MED ORDER — NYSTATIN 100000 UNIT/ML MT SUSP: 5.0000 mL | Freq: Four times a day (QID) | OROMUCOSAL | Status: DC

## 2021-06-16 MED ORDER — POTASSIUM CHLORIDE CRYS ER 20 MEQ PO TBCR
40.0000 meq | EXTENDED_RELEASE_TABLET | ORAL | Status: AC
  Administered 2021-06-16: 40 meq via ORAL
  Filled 2021-06-16: qty 2

## 2021-06-16 MED ORDER — DIPHENHYDRAMINE HCL 50 MG/ML IJ SOLN
25.0000 mg | Freq: Four times a day (QID) | INTRAMUSCULAR | Status: DC | PRN
  Administered 2021-06-16: 25 mg via INTRAVENOUS
  Filled 2021-06-16: qty 1

## 2021-06-16 MED ORDER — PANTOPRAZOLE SODIUM 40 MG PO TBEC: 40.0000 mg | DELAYED_RELEASE_TABLET | Freq: Every day | ORAL | 0 refills | Status: DC

## 2021-06-16 NOTE — Progress Notes (Signed)
Discharge instructions discussed with patient, verbalized agreement and understanding 

## 2021-06-16 NOTE — Discharge Summary (Signed)
Physician Discharge Summary  [name]:[id number] DOB: [birth year] DOA: 06/09/2021  PCP: Deland Pretty, MD  Admit date: 06/09/2021 Discharge date: 06/16/2021  Admitted From: Home Disposition:  Home  Recommendations for Outpatient Follow-up:  Follow up with PCP in 1-2 weeks Follow up with GI in 4-6 weeks or sooner as needed  Discharge Condition:Improved CODE STATUS:Full Diet recommendation: Regular   Brief/Interim Summary: 75 y.o. female past medical history significant for hypothyroidism, rheumatoid arthritis comes in for abdominal pain that started the day prior to admission with epigastric and right lower quadrant CT scan of the abdomen and pelvis in the ED showed acute diverticulitis was started on Zosyn.  Discharge Diagnoses:  Principal Problem:   Diverticulitis Active Problems:   Acute diverticulitis  Sepsis due to acute Diverticulitis present on admit Has been continued on IV zosyn, to complete one more day WBC normalized, down from peak of 14.7k Lower quadrant pain improved Noted to have epigastric pains this visit, see below Advanced to soft diet Recommend f/u referral to GI for colonoscopy 6 weeks from now   Hypothyroidism: Continue Synthroid as tolerated Most recent TSH from 2012 of 3.702  Anxiety:  Continue citalopram and Xanax as tolerated Seems stable at this time   Rheumatoid arthritis/osteoarthritis: Methotrexate continues to be on hold for now. Defer resuming the medications to pt's Rheumatologist  Cough variant asthma: Continue current home regimen. On minimal O2 support, no wheezing auscultated   Hypocalcemia -Given IV Ca this visit -continue on scheduled PO calcium -PTH elevated at 76 with Ca 6.1 -vit D level normal -Recommend cont home Ca supplementation -Pt advised to f/u with PCP for continued work up   Epigastric pain -Persistent epigastric tenderness thus far this visit -Pt and family reports remote hx of "two ulcers" seen on  EGD 5-33yrs ago. Pt known to [facility] -Ordered and reviewed abd xray, unremarkable with no evidence of obstruction -Had discussed with GI over phone who recommends trial of PPI.  -Following day, epigastric pain resolved -Will prescribe Protonix on d/c -Recommend f/u with [facility] as needed    Discharge Instructions   Allergies as of 06/16/2021       Reactions   Remicade [infliximab] Shortness Of Breath        Medication List     STOP taking these medications    methotrexate 2.5 MG tablet   Prolia 60 MG/ML Sosy injection Generic drug: denosumab   SIMPONI ARIA IV       TAKE these medications    Advair HFA 45-21 MCG/ACT inhaler Generic drug: fluticasone-salmeterol Inhale 2 puffs into the lungs 2 (two) times daily. What changed:  when to take this reasons to take this   albuterol 108 (90 Base) MCG/ACT inhaler Commonly known as: VENTOLIN HFA Inhale 2 puffs into the lungs every 6 (six) hours as needed for wheezing or shortness of breath.   ALPRAZolam 0.5 MG tablet Commonly known as: XANAX Take 0.5 mg by mouth at bedtime.   BIOTIN PO Take 2,000 mcg by mouth every morning.   CALCIUM+D3 PO Take 1 capsule by mouth every morning. Calcium 1200 mg, vitamin D 25 mcg/1000 units   citalopram 20 MG tablet Commonly known as: CELEXA Take 20 mg by mouth every morning.   folic acid 503 MCG tablet Commonly known as: FOLVITE Take 800 mcg by mouth every morning.   ibuprofen 200 MG tablet Commonly known as: ADVIL Take 400 mg by mouth every 6 (six) hours as needed for fever or headache (pain).  levothyroxine 75 MCG tablet Commonly known as: SYNTHROID Take 75 mcg by mouth daily before breakfast.   montelukast 10 MG tablet Commonly known as: SINGULAIR TAKE 1 TABLET BY MOUTH EVERYDAY AT BEDTIME What changed: See the new instructions.   pantoprazole 40 MG tablet Commonly known as: PROTONIX Take 1 tablet (40 mg total) by mouth daily. Start taking on: June 17, 2021   polyvinyl alcohol 1.4 % ophthalmic solution Commonly known as: LIQUIFILM TEARS Place 1 drop into both eyes daily as needed for dry eyes.   rosuvastatin 5 MG tablet Commonly known as: CRESTOR Take 5 mg by mouth every other day.   Vitamin D 50 MCG (2000 UT) Caps Take 2,000 Units by mouth every morning.   ZyrTEC Allergy 10 MG Caps Generic drug: Cetirizine HCl Take 10 mg by mouth daily as needed (seasonal allergies).        Follow-up Information     Deland Pretty, MD Follow up in 1 week(s).   Specialty: Internal Medicine Why: Hospital follow up Contact information: [address] [place] [address] [phone]         Freada Bergeron, MD .   Specialties: Cardiology, Radiology Contact information: [address] [place] [address] [phone]         Gastroenterology, [facility]. Call.   Why: As needed Contact information: [address] [place] [address] [phone]                Allergies  Allergen Reactions   Remicade [Infliximab] Shortness Of Breath    Consultations: Discussed case with [facility]  Procedures/Studies: DG Abd 1 View  Result Date: 06/15/2021 CLINICAL DATA:  Abdominal pain EXAM: ABDOMEN - 1 VIEW COMPARISON:  None. FINDINGS: Nonobstructive bowel gas pattern. Visualized osseous structures are within normal limits. IMPRESSION: Negative. Electronically Signed   By: Julian Hy M.D.   On: 06/15/2021 20:35   CT ABDOMEN PELVIS W CONTRAST  Result Date: 06/09/2021 CLINICAL DATA:  Suspected diverticulitis in a 76 year old female. Presenting with mid abdominal pain and nausea and vomiting since yesterday. EXAM: CT ABDOMEN AND PELVIS WITH CONTRAST TECHNIQUE: Multidetector CT imaging of the abdomen and pelvis was performed using the standard protocol following bolus administration of intravenous contrast. CONTRAST:  146mL OMNIPAQUE IOHEXOL 300 MG/ML  SOLN COMPARISON:   Comparison made with October 24, 2010. FINDINGS: Lower chest: Small pulmonary nodules in the RIGHT lower lobe largest approximately 3 mm (image 3/4) additional nodule approximately 3 mm (image 3/4) no effusion. No consolidative process. Hepatobiliary: Stable mild prominence of the extrahepatic biliary tree without intrahepatic biliary duct distension. Tiny hypodensities in the RIGHT hepatic lobe 1 not definitely present on previous imaging from 20/12 are more likely compatible with small cysts based on density on coronal images portal vein is patent. No pericholecystic stranding. Common bile duct measurement is approximately 7-8 mm but again is stable dating back to 2012. Pancreas: Normal, without mass, inflammation or ductal dilatation. Spleen: Small low-density lesion in the cephalad spleen is indeterminate but more likely benign. Spleen is normal size. Adrenals/Urinary Tract: Adrenal glands are normal. Symmetric enhancement of the bilateral kidneys. Renal cysts of varying size seen in the bilateral kidneys as well more numerous than on previous imaging and slightly increased in size in terms of the dominant cyst in the lower pole the LEFT kidney which measures 2.5 as compared to 1.6 cm. No hydronephrosis or perinephric stranding. The urinary bladder shows a smooth contour. Stomach/Bowel: No sign  of bowel obstruction. The appendix is normal. Diverticular changes of the sigmoid with mild adjacent stranding about the mid sigmoid (image 63/2) no signs of free air or abscess. Vascular/Lymphatic: Aortic atherosclerosis. No sign of aneurysm. Smooth contour of the IVC. There is no gastrohepatic or hepatoduodenal ligament lymphadenopathy. No retroperitoneal or mesenteric lymphadenopathy. No pelvic sidewall lymphadenopathy. Reproductive: 4.3 x 2.6 cm RIGHT adnexal cyst without septation or peripheral nodularity. Reproductive structures are otherwise unremarkable. Other: No abdominal wall hernia or abnormality. No  abdominopelvic ascites. Musculoskeletal: No acute bone finding. No destructive bone process. Spinal degenerative changes. Chronic appearing compression fracture at T11 shows no change. IMPRESSION: Mild uncomplicated acute diverticulitis of the mid sigmoid colon, superimposed on a background of diverticular changes. 4.3 x 2.6 cm RIGHT adnexal cyst without septation or peripheral nodularity. Recommend follow-up US in 6-12 months. Note: This recommendation does not apply to premenarchal patients and to those with increased risk (genetic, family history, elevated tumor markers or other high-risk factors) of ovarian cancer. Reference: JACR 2020 Feb; 17(2):248-254 Small pulmonary nodules in the RIGHT lower lobe largest approximately 3 mm. No follow-up needed if patient is low-risk (and has no known or suspected primary neoplasm). Non-contrast chest CT can be considered in 12 months if patient is high-risk. This recommendation follows the consensus statement: Guidelines for Management of Incidental Pulmonary Nodules Detected on CT Images: From the Fleischner Society 2017; Radiology 2017; 284:228-243. Aortic Atherosclerosis (ICD10-I70.0). Electronically Signed   By: Zetta Bills M.D.   On: 06/09/2021 10:05    Subjective: Eager to go home today  Discharge Exam: Vitals:   06/16/21 0515 06/16/21 1350  BP: 123/76 125/68  Pulse: 86 89  Resp: 15 18  Temp: 99.2 F (37.3 C) 98.3 F (36.8 C)  SpO2: 95% 97%   Vitals:   06/15/21 1425 06/15/21 2113 06/16/21 0515 06/16/21 1350  BP: 136/76 (!) 142/73 123/76 125/68  Pulse: 90 93 86 89  Resp: (!) 22 16 15 18   Temp: 98.5 F (36.9 C) 98.1 F (36.7 C) 99.2 F (37.3 C) 98.3 F (36.8 C)  TempSrc: Oral     SpO2: 97% 97% 95% 97%  Weight:      Height:        General: Pt is alert, awake, not in acute distress Cardiovascular: RRR, S1/S2  Respiratory: CTA bilaterally, no wheezing, no rhonchi Abdominal: Soft, NT, ND, bowel sounds + Extremities: no edema, no  cyanosis   The results of significant diagnostics from this hospitalization (including imaging, microbiology, ancillary and laboratory) are listed below for reference.     Microbiology: Recent Results (from the past 240 hour(s))  Resp Panel by RT-PCR (Flu A&B, Covid) Nasopharyngeal Swab     Status: None   Collection Time: 06/09/21  8:37 AM   Specimen: Nasopharyngeal Swab; Nasopharyngeal(NP) swabs in vial transport medium  Result Value Ref Range Status   SARS Coronavirus 2 by RT PCR NEGATIVE NEGATIVE Final    Comment: (NOTE) SARS-CoV-2 target nucleic acids are NOT DETECTED.  The SARS-CoV-2 RNA is generally detectable in upper respiratory specimens during the acute phase of infection. The lowest concentration of SARS-CoV-2 viral copies this assay can detect is 138 copies/mL. A negative result does not preclude SARS-Cov-2 infection and should not be used as the sole basis for treatment or other patient management decisions. A negative result may occur with  improper specimen collection/handling, submission of specimen other than nasopharyngeal swab, presence of viral mutation(s) within the areas targeted by this assay, and inadequate number of viral copies(<138  copies/mL). A negative result must be combined with clinical observations, patient history, and epidemiological information. The expected result is Negative.  Fact Sheet for Patients:  EntrepreneurPulse.com.au  Fact Sheet for Healthcare Providers:  IncredibleEmployment.be  This test is no t yet approved or cleared by the [place] FDA and  has been authorized for detection and/or diagnosis of SARS-CoV-2 by FDA under an Emergency Use Authorization (EUA). This EUA will remain  in effect (meaning this test can be used) for the duration of the COVID-19 declaration under Section 564(b)(1) of the Act, 21 U.S.C.section 360bbb-3(b)(1), unless the authorization is terminated  or revoked  sooner.       Influenza A by PCR NEGATIVE NEGATIVE Final   Influenza B by PCR NEGATIVE NEGATIVE Final    Comment: (NOTE) The Xpert Xpress SARS-CoV-2/FLU/RSV plus assay is intended as an aid in the diagnosis of influenza from Nasopharyngeal swab specimens and should not be used as a sole basis for treatment. Nasal washings and aspirates are unacceptable for Xpert Xpress SARS-CoV-2/FLU/RSV testing.  Fact Sheet for Patients: EntrepreneurPulse.com.au  Fact Sheet for Healthcare Providers: IncredibleEmployment.be  This test is not yet approved or cleared by the [place] FDA and has been authorized for detection and/or diagnosis of SARS-CoV-2 by FDA under an Emergency Use Authorization (EUA). This EUA will remain in effect (meaning this test can be used) for the duration of the COVID-19 declaration under Section 564(b)(1) of the Act, 21 U.S.C. section 360bbb-3(b)(1), unless the authorization is terminated or revoked.  Performed at [facility], [address], [place], [place] [address]      Labs: BNP (last 3 results) No results for input(s): BNP in the last 8760 hours. Basic Metabolic Panel: Recent Labs  Lab 06/12/21 0404 06/13/21 0854 06/14/21 0518 06/15/21 0440 06/16/21 0416  NA 137 137 139 140 138  K 3.3* 3.2* 3.8 3.6 3.3*  CL 108 110 115* 111 110  CO2 18* 17* 22 24 25   GLUCOSE 59* 73 90 107* 91  BUN 15 10 <5* <5* <5*  CREATININE 0.67 0.54 0.63 0.66 0.67  CALCIUM 6.5* 6.2*  6.1 6.6* 7.2* 6.8*  MG  --   --   --   --  1.6*   Liver Function Tests: Recent Labs  Lab 06/12/21 0404 06/13/21 0854 06/14/21 0518 06/15/21 0440 06/16/21 0416  AST 18 18 21 22 22   ALT 17 17 18 20 21   ALKPHOS 35* 35* 33* 30* 31*  BILITOT 1.3* 1.0 0.9 0.9 0.7  PROT 6.5 6.2* 5.8* 5.7* 5.2*  ALBUMIN 3.7 3.6 3.4* 3.3* 3.0*   No results for input(s): LIPASE, AMYLASE in the last 168 hours. No results for input(s): AMMONIA in the  last 168 hours. CBC: Recent Labs  Lab 06/10/21 0420 06/13/21 0854 06/14/21 0518 06/15/21 0440  WBC 12.1* 7.8 8.5 6.7  HGB 11.5* 10.6* 10.6* 10.3*  HCT 36.9 34.1* 33.0* 32.2*  MCV 85.6 85.9 84.0 84.7  PLT 282 211 222 187   Cardiac Enzymes: No results for input(s): CKTOTAL, CKMB, CKMBINDEX, TROPONINI in the last 168 hours. BNP: Invalid input(s): POCBNP CBG: No results for input(s): GLUCAP in the last 168 hours. D-Dimer No results for input(s): DDIMER in the last 72 hours. Hgb A1c No results for input(s): HGBA1C in the last 72 hours. Lipid Profile No results for input(s): CHOL, HDL, LDLCALC, TRIG, CHOLHDL, LDLDIRECT in the last 72 hours. Thyroid function studies No results for input(s): TSH, T4TOTAL, T3FREE, THYROIDAB in the last 72 hours.  Invalid input(s):  FREET3 Anemia work up No results for input(s): VITAMINB12, FOLATE, FERRITIN, TIBC, IRON, RETICCTPCT in the last 72 hours. Urinalysis    Component Value Date/Time   COLORURINE COLORLESS (A) 06/09/2021 0827   APPEARANCEUR CLEAR 06/09/2021 0827   LABSPEC 1.039 (H) 06/09/2021 0827   PHURINE 8.0 06/09/2021 0827   GLUCOSEU NEGATIVE 06/09/2021 0827   HGBUR NEGATIVE 06/09/2021 0827   BILIRUBINUR NEGATIVE 06/09/2021 0827   KETONESUR 40 (A) 06/09/2021 0827   PROTEINUR TRACE (A) 06/09/2021 0827   UROBILINOGEN 0.2 10/24/2010 1748   NITRITE NEGATIVE 06/09/2021 0827   LEUKOCYTESUR NEGATIVE 06/09/2021 0827   Sepsis Labs Invalid input(s): PROCALCITONIN,  WBC,  LACTICIDVEN Microbiology Recent Results (from the past 240 hour(s))  Resp Panel by RT-PCR (Flu A&B, Covid) Nasopharyngeal Swab     Status: None   Collection Time: 06/09/21  8:37 AM   Specimen: Nasopharyngeal Swab; Nasopharyngeal(NP) swabs in vial transport medium  Result Value Ref Range Status   SARS Coronavirus 2 by RT PCR NEGATIVE NEGATIVE Final    Comment: (NOTE) SARS-CoV-2 target nucleic acids are NOT DETECTED.  The SARS-CoV-2 RNA is generally detectable in  upper respiratory specimens during the acute phase of infection. The lowest concentration of SARS-CoV-2 viral copies this assay can detect is 138 copies/mL. A negative result does not preclude SARS-Cov-2 infection and should not be used as the sole basis for treatment or other patient management decisions. A negative result may occur with  improper specimen collection/handling, submission of specimen other than nasopharyngeal swab, presence of viral mutation(s) within the areas targeted by this assay, and inadequate number of viral copies(<138 copies/mL). A negative result must be combined with clinical observations, patient history, and epidemiological information. The expected result is Negative.  Fact Sheet for Patients:  EntrepreneurPulse.com.au  Fact Sheet for Healthcare Providers:  IncredibleEmployment.be  This test is no t yet approved or cleared by the [place] FDA and  has been authorized for detection and/or diagnosis of SARS-CoV-2 by FDA under an Emergency Use Authorization (EUA). This EUA will remain  in effect (meaning this test can be used) for the duration of the COVID-19 declaration under Section 564(b)(1) of the Act, 21 U.S.C.section 360bbb-3(b)(1), unless the authorization is terminated  or revoked sooner.       Influenza A by PCR NEGATIVE NEGATIVE Final   Influenza B by PCR NEGATIVE NEGATIVE Final    Comment: (NOTE) The Xpert Xpress SARS-CoV-2/FLU/RSV plus assay is intended as an aid in the diagnosis of influenza from Nasopharyngeal swab specimens and should not be used as a sole basis for treatment. Nasal washings and aspirates are unacceptable for Xpert Xpress SARS-CoV-2/FLU/RSV testing.  Fact Sheet for Patients: EntrepreneurPulse.com.au  Fact Sheet for Healthcare Providers: IncredibleEmployment.be  This test is not yet approved or cleared by the [place] FDA and has been  authorized for detection and/or diagnosis of SARS-CoV-2 by FDA under an Emergency Use Authorization (EUA). This EUA will remain in effect (meaning this test can be used) for the duration of the COVID-19 declaration under Section 564(b)(1) of the Act, 21 U.S.C. section 360bbb-3(b)(1), unless the authorization is terminated or revoked.  Performed at [facility], [address] [place], [place] [address]    Time spent: 2min  SIGNED:   Marylu Lund, MD  [facility] 06/16/2021, 5:22 PM  If 7PM-7AM, please contact night-coverage

## 2021-06-19 DIAGNOSIS — K5792 Diverticulitis of intestine, part unspecified, without perforation or abscess without bleeding: Secondary | ICD-10-CM | POA: Diagnosis not present

## 2021-06-19 DIAGNOSIS — R918 Other nonspecific abnormal finding of lung field: Secondary | ICD-10-CM | POA: Diagnosis not present

## 2021-06-19 DIAGNOSIS — N949 Unspecified condition associated with female genital organs and menstrual cycle: Secondary | ICD-10-CM | POA: Diagnosis not present

## 2021-07-08 DIAGNOSIS — E211 Secondary hyperparathyroidism, not elsewhere classified: Secondary | ICD-10-CM | POA: Diagnosis not present

## 2021-07-08 DIAGNOSIS — E039 Hypothyroidism, unspecified: Secondary | ICD-10-CM | POA: Diagnosis not present

## 2021-07-08 DIAGNOSIS — Z8639 Personal history of other endocrine, nutritional and metabolic disease: Secondary | ICD-10-CM | POA: Diagnosis not present

## 2021-07-08 DIAGNOSIS — M810 Age-related osteoporosis without current pathological fracture: Secondary | ICD-10-CM | POA: Diagnosis not present

## 2021-07-08 DIAGNOSIS — I2584 Coronary atherosclerosis due to calcified coronary lesion: Secondary | ICD-10-CM | POA: Diagnosis not present

## 2021-07-08 DIAGNOSIS — Z8781 Personal history of (healed) traumatic fracture: Secondary | ICD-10-CM | POA: Diagnosis not present

## 2021-07-08 DIAGNOSIS — Z6823 Body mass index (BMI) 23.0-23.9, adult: Secondary | ICD-10-CM | POA: Diagnosis not present

## 2021-07-08 DIAGNOSIS — M0589 Other rheumatoid arthritis with rheumatoid factor of multiple sites: Secondary | ICD-10-CM | POA: Diagnosis not present

## 2021-07-11 DIAGNOSIS — I129 Hypertensive chronic kidney disease with stage 1 through stage 4 chronic kidney disease, or unspecified chronic kidney disease: Secondary | ICD-10-CM | POA: Diagnosis not present

## 2021-07-11 DIAGNOSIS — N182 Chronic kidney disease, stage 2 (mild): Secondary | ICD-10-CM | POA: Diagnosis not present

## 2021-07-11 DIAGNOSIS — E039 Hypothyroidism, unspecified: Secondary | ICD-10-CM | POA: Diagnosis not present

## 2021-07-11 DIAGNOSIS — M810 Age-related osteoporosis without current pathological fracture: Secondary | ICD-10-CM | POA: Diagnosis not present

## 2021-07-15 DIAGNOSIS — M0589 Other rheumatoid arthritis with rheumatoid factor of multiple sites: Secondary | ICD-10-CM | POA: Diagnosis not present

## 2021-07-24 ENCOUNTER — Other Ambulatory Visit: Payer: Self-pay | Admitting: Physician Assistant

## 2021-07-24 DIAGNOSIS — R1013 Epigastric pain: Secondary | ICD-10-CM | POA: Diagnosis not present

## 2021-07-24 DIAGNOSIS — R1310 Dysphagia, unspecified: Secondary | ICD-10-CM

## 2021-07-24 DIAGNOSIS — K5792 Diverticulitis of intestine, part unspecified, without perforation or abscess without bleeding: Secondary | ICD-10-CM | POA: Diagnosis not present

## 2021-07-30 ENCOUNTER — Ambulatory Visit
Admission: RE | Admit: 2021-07-30 | Discharge: 2021-07-30 | Disposition: A | Discharge status: Home / Self Care | Payer: Medicare Other | Source: Ambulatory Visit | Attending: Physician Assistant | Admitting: Physician Assistant

## 2021-07-30 ENCOUNTER — Other Ambulatory Visit: Payer: Self-pay | Admitting: Physician Assistant

## 2021-07-30 DIAGNOSIS — R1310 Dysphagia, unspecified: Secondary | ICD-10-CM

## 2021-07-30 DIAGNOSIS — K449 Diaphragmatic hernia without obstruction or gangrene: Secondary | ICD-10-CM | POA: Diagnosis not present

## 2021-08-14 DIAGNOSIS — Z0000 Encounter for general adult medical examination without abnormal findings: Secondary | ICD-10-CM | POA: Diagnosis not present

## 2021-08-14 DIAGNOSIS — Z8639 Personal history of other endocrine, nutritional and metabolic disease: Secondary | ICD-10-CM | POA: Diagnosis not present

## 2021-08-14 DIAGNOSIS — I10 Essential (primary) hypertension: Secondary | ICD-10-CM | POA: Diagnosis not present

## 2021-08-18 DIAGNOSIS — R911 Solitary pulmonary nodule: Secondary | ICD-10-CM | POA: Diagnosis not present

## 2021-08-18 DIAGNOSIS — E211 Secondary hyperparathyroidism, not elsewhere classified: Secondary | ICD-10-CM | POA: Diagnosis not present

## 2021-08-18 DIAGNOSIS — M0589 Other rheumatoid arthritis with rheumatoid factor of multiple sites: Secondary | ICD-10-CM | POA: Diagnosis not present

## 2021-08-18 DIAGNOSIS — I10 Essential (primary) hypertension: Secondary | ICD-10-CM | POA: Diagnosis not present

## 2021-08-18 DIAGNOSIS — D849 Immunodeficiency, unspecified: Secondary | ICD-10-CM | POA: Diagnosis not present

## 2021-08-18 DIAGNOSIS — Z0000 Encounter for general adult medical examination without abnormal findings: Secondary | ICD-10-CM | POA: Diagnosis not present

## 2021-08-18 DIAGNOSIS — E039 Hypothyroidism, unspecified: Secondary | ICD-10-CM | POA: Diagnosis not present

## 2021-08-18 DIAGNOSIS — J309 Allergic rhinitis, unspecified: Secondary | ICD-10-CM | POA: Diagnosis not present

## 2021-08-18 DIAGNOSIS — J4520 Mild intermittent asthma, uncomplicated: Secondary | ICD-10-CM | POA: Diagnosis not present

## 2021-08-18 DIAGNOSIS — I2510 Atherosclerotic heart disease of native coronary artery without angina pectoris: Secondary | ICD-10-CM | POA: Diagnosis not present

## 2021-08-18 DIAGNOSIS — M810 Age-related osteoporosis without current pathological fracture: Secondary | ICD-10-CM | POA: Diagnosis not present

## 2021-08-18 DIAGNOSIS — F329 Major depressive disorder, single episode, unspecified: Secondary | ICD-10-CM | POA: Diagnosis not present

## 2021-08-21 DIAGNOSIS — J4520 Mild intermittent asthma, uncomplicated: Secondary | ICD-10-CM | POA: Diagnosis not present

## 2021-08-25 DIAGNOSIS — M150 Primary generalized (osteo)arthritis: Secondary | ICD-10-CM | POA: Diagnosis not present

## 2021-08-25 DIAGNOSIS — M0589 Other rheumatoid arthritis with rheumatoid factor of multiple sites: Secondary | ICD-10-CM | POA: Diagnosis not present

## 2021-08-25 DIAGNOSIS — M810 Age-related osteoporosis without current pathological fracture: Secondary | ICD-10-CM | POA: Diagnosis not present

## 2021-08-25 DIAGNOSIS — N182 Chronic kidney disease, stage 2 (mild): Secondary | ICD-10-CM | POA: Diagnosis not present

## 2021-08-25 DIAGNOSIS — M3500 Sicca syndrome, unspecified: Secondary | ICD-10-CM | POA: Diagnosis not present

## 2021-08-25 DIAGNOSIS — M549 Dorsalgia, unspecified: Secondary | ICD-10-CM | POA: Diagnosis not present

## 2021-08-25 DIAGNOSIS — M7552 Bursitis of left shoulder: Secondary | ICD-10-CM | POA: Diagnosis not present

## 2021-08-25 DIAGNOSIS — M4125 Other idiopathic scoliosis, thoracolumbar region: Secondary | ICD-10-CM | POA: Diagnosis not present

## 2021-08-25 DIAGNOSIS — Z79899 Other long term (current) drug therapy: Secondary | ICD-10-CM | POA: Diagnosis not present

## 2021-08-28 DIAGNOSIS — K5792 Diverticulitis of intestine, part unspecified, without perforation or abscess without bleeding: Secondary | ICD-10-CM | POA: Diagnosis not present

## 2021-08-28 DIAGNOSIS — R1013 Epigastric pain: Secondary | ICD-10-CM | POA: Diagnosis not present

## 2021-09-09 DIAGNOSIS — M0589 Other rheumatoid arthritis with rheumatoid factor of multiple sites: Secondary | ICD-10-CM | POA: Diagnosis not present

## 2021-10-16 DIAGNOSIS — M810 Age-related osteoporosis without current pathological fracture: Secondary | ICD-10-CM | POA: Diagnosis not present

## 2021-10-22 DIAGNOSIS — K5730 Diverticulosis of large intestine without perforation or abscess without bleeding: Secondary | ICD-10-CM | POA: Diagnosis not present

## 2021-10-22 DIAGNOSIS — K2930 Chronic superficial gastritis without bleeding: Secondary | ICD-10-CM | POA: Diagnosis not present

## 2021-10-22 DIAGNOSIS — R1310 Dysphagia, unspecified: Secondary | ICD-10-CM | POA: Diagnosis not present

## 2021-10-22 DIAGNOSIS — R1013 Epigastric pain: Secondary | ICD-10-CM | POA: Diagnosis not present

## 2021-10-22 DIAGNOSIS — K621 Rectal polyp: Secondary | ICD-10-CM | POA: Diagnosis not present

## 2021-10-22 DIAGNOSIS — R933 Abnormal findings on diagnostic imaging of other parts of digestive tract: Secondary | ICD-10-CM | POA: Diagnosis not present

## 2021-10-22 DIAGNOSIS — K2289 Other specified disease of esophagus: Secondary | ICD-10-CM | POA: Diagnosis not present

## 2021-10-30 DIAGNOSIS — K621 Rectal polyp: Secondary | ICD-10-CM | POA: Diagnosis not present

## 2021-10-30 DIAGNOSIS — K2289 Other specified disease of esophagus: Secondary | ICD-10-CM | POA: Diagnosis not present

## 2021-10-30 DIAGNOSIS — K2930 Chronic superficial gastritis without bleeding: Secondary | ICD-10-CM | POA: Diagnosis not present

## 2021-11-04 DIAGNOSIS — M0589 Other rheumatoid arthritis with rheumatoid factor of multiple sites: Secondary | ICD-10-CM | POA: Diagnosis not present

## 2021-12-10 DIAGNOSIS — M150 Primary generalized (osteo)arthritis: Secondary | ICD-10-CM | POA: Diagnosis not present

## 2021-12-10 DIAGNOSIS — Z79899 Other long term (current) drug therapy: Secondary | ICD-10-CM | POA: Diagnosis not present

## 2021-12-10 DIAGNOSIS — N182 Chronic kidney disease, stage 2 (mild): Secondary | ICD-10-CM | POA: Diagnosis not present

## 2021-12-10 DIAGNOSIS — M549 Dorsalgia, unspecified: Secondary | ICD-10-CM | POA: Diagnosis not present

## 2021-12-10 DIAGNOSIS — M0589 Other rheumatoid arthritis with rheumatoid factor of multiple sites: Secondary | ICD-10-CM | POA: Diagnosis not present

## 2021-12-10 DIAGNOSIS — M810 Age-related osteoporosis without current pathological fracture: Secondary | ICD-10-CM | POA: Diagnosis not present

## 2021-12-10 DIAGNOSIS — M4125 Other idiopathic scoliosis, thoracolumbar region: Secondary | ICD-10-CM | POA: Diagnosis not present

## 2021-12-10 DIAGNOSIS — M3500 Sicca syndrome, unspecified: Secondary | ICD-10-CM | POA: Diagnosis not present

## 2021-12-30 DIAGNOSIS — M0589 Other rheumatoid arthritis with rheumatoid factor of multiple sites: Secondary | ICD-10-CM | POA: Diagnosis not present

## 2022-01-19 DIAGNOSIS — W57XXXA Bitten or stung by nonvenomous insect and other nonvenomous arthropods, initial encounter: Secondary | ICD-10-CM | POA: Diagnosis not present

## 2022-01-19 DIAGNOSIS — S80861A Insect bite (nonvenomous), right lower leg, initial encounter: Secondary | ICD-10-CM | POA: Diagnosis not present

## 2022-01-30 ENCOUNTER — Other Ambulatory Visit: Payer: Self-pay

## 2022-01-30 ENCOUNTER — Emergency Department (HOSPITAL_BASED_OUTPATIENT_CLINIC_OR_DEPARTMENT_OTHER)
Admission: EM | Admit: 2022-01-30 | Discharge: 2022-01-30 | Disposition: A | Discharge status: Home / Self Care | Payer: Medicare Other | Attending: Emergency Medicine | Admitting: Emergency Medicine

## 2022-01-30 ENCOUNTER — Encounter (HOSPITAL_BASED_OUTPATIENT_CLINIC_OR_DEPARTMENT_OTHER): Payer: Self-pay | Admitting: Emergency Medicine

## 2022-01-30 ENCOUNTER — Emergency Department (HOSPITAL_BASED_OUTPATIENT_CLINIC_OR_DEPARTMENT_OTHER): Payer: Medicare Other

## 2022-01-30 DIAGNOSIS — R1032 Left lower quadrant pain: Secondary | ICD-10-CM | POA: Insufficient documentation

## 2022-01-30 DIAGNOSIS — K469 Unspecified abdominal hernia without obstruction or gangrene: Secondary | ICD-10-CM | POA: Diagnosis not present

## 2022-01-30 DIAGNOSIS — K5792 Diverticulitis of intestine, part unspecified, without perforation or abscess without bleeding: Secondary | ICD-10-CM | POA: Diagnosis not present

## 2022-01-30 DIAGNOSIS — K529 Noninfective gastroenteritis and colitis, unspecified: Secondary | ICD-10-CM | POA: Diagnosis not present

## 2022-01-30 DIAGNOSIS — K6389 Other specified diseases of intestine: Secondary | ICD-10-CM | POA: Diagnosis not present

## 2022-01-30 DIAGNOSIS — K5730 Diverticulosis of large intestine without perforation or abscess without bleeding: Secondary | ICD-10-CM | POA: Diagnosis not present

## 2022-01-30 LAB — URINALYSIS, ROUTINE W REFLEX MICROSCOPIC
Bilirubin Urine: NEGATIVE
Glucose, UA: NEGATIVE mg/dL
Hgb urine dipstick: NEGATIVE
Ketones, ur: 15 mg/dL — AB
Nitrite: NEGATIVE
Protein, ur: NEGATIVE mg/dL
Specific Gravity, Urine: 1.006 (ref 1.005–1.030)
pH: 7 (ref 5.0–8.0)

## 2022-01-30 LAB — COMPREHENSIVE METABOLIC PANEL
ALT: 10 U/L (ref 0–44)
AST: 14 U/L — ABNORMAL LOW (ref 15–41)
Albumin: 4.3 g/dL (ref 3.5–5.0)
Alkaline Phosphatase: 43 U/L (ref 38–126)
Anion gap: 12 (ref 5–15)
BUN: 17 mg/dL (ref 8–23)
CO2: 24 mmol/L (ref 22–32)
Calcium: 9.7 mg/dL (ref 8.9–10.3)
Chloride: 103 mmol/L (ref 98–111)
Creatinine, Ser: 0.73 mg/dL (ref 0.44–1.00)
GFR, Estimated: >60 mL/min (ref >60)
Glucose, Bld: 119 mg/dL — ABNORMAL HIGH (ref 70–99)
Potassium: 3.8 mmol/L (ref 3.5–5.1)
Sodium: 139 mmol/L (ref 135–145)
Total Bilirubin: 1.1 mg/dL (ref 0.3–1.2)
Total Protein: 7.2 g/dL (ref 6.5–8.1)

## 2022-01-30 LAB — CBC
HCT: 40.8 % (ref 36.0–46.0)
Hemoglobin: 13.8 g/dL (ref 12.0–15.0)
MCH: 29.4 pg (ref 26.0–34.0)
MCHC: 33.8 g/dL (ref 30.0–36.0)
MCV: 87 fL (ref 80.0–100.0)
Platelets: 267 10*3/uL (ref 150–400)
RBC: 4.69 MIL/uL (ref 3.87–5.11)
RDW: 14.3 % (ref 11.5–15.5)
WBC: 16.5 10*3/uL — ABNORMAL HIGH (ref 4.0–10.5)
nRBC: 0 % (ref 0.0–0.2)

## 2022-01-30 LAB — LIPASE, BLOOD: Lipase: 22 U/L (ref 11–51)

## 2022-01-30 MED ORDER — ACETAMINOPHEN 325 MG PO TABS
650.0000 mg | ORAL_TABLET | Freq: Four times a day (QID) | ORAL | Status: DC | PRN
  Administered 2022-01-30: 650 mg via ORAL
  Filled 2022-01-30: qty 2

## 2022-01-30 MED ORDER — AMOXICILLIN-POT CLAVULANATE 875-125 MG PO TABS
1.0000 | ORAL_TABLET | Freq: Once | ORAL | Status: AC
  Administered 2022-01-30: 1 via ORAL
  Filled 2022-01-30: qty 1

## 2022-01-30 MED ORDER — IOHEXOL 300 MG/ML  SOLN
100.0000 mL | Freq: Once | INTRAMUSCULAR | Status: AC | PRN
  Administered 2022-01-30: 80 mL via INTRAVENOUS

## 2022-01-30 MED ORDER — HYDROCODONE-ACETAMINOPHEN 5-325 MG PO TABS: 1.0000 | ORAL_TABLET | ORAL | 0 refills | Status: DC | PRN

## 2022-01-30 MED ORDER — ONDANSETRON HCL 4 MG PO TABS: 4.0000 mg | ORAL_TABLET | Freq: Every day | ORAL | 1 refills | Status: DC | PRN

## 2022-01-30 MED ORDER — ONDANSETRON 4 MG PO TBDP: 4.0000 mg | ORAL_TABLET | Freq: Three times a day (TID) | ORAL | 0 refills | Status: DC | PRN

## 2022-01-30 MED ORDER — AMOXICILLIN-POT CLAVULANATE 875-125 MG PO TABS: 1.0000 | ORAL_TABLET | Freq: Two times a day (BID) | ORAL | 0 refills | Status: DC

## 2022-01-30 NOTE — ED Triage Notes (Signed)
Pt arrives to ED with c/o abdominal discomfort, diarrhea, and bloody mucous that started today. The abdominal pain is suprapubic.

## 2022-01-30 NOTE — ED Provider Notes (Signed)
[facility] EMERGENCY DEPT Provider Note   CSN: [id number] Arrival date & time: 01/30/22  7846     History  Chief Complaint  Patient presents with   Abdominal Pain    [name] is a 76 y.o. female.  Patient complains of left lower quadrant abdominal pain. patient reports she has had 3 episodes of diarrhea.  the last 2 episodes were bloody.  Patient has a history of diverticulitis her first episode was in November of last year.  Patient reports she did have a colonoscopy after that episode.  Patient describes the pain as crampy and achy.  Patient reports she has vomited once she is nauseated.  Patient denies any fever or chills she denies any chest discomfort.  The history is provided by the patient. No language interpreter was used.  Abdominal Pain Pain location:  LLQ      Home Medications Prior to Admission medications   Medication Sig Start Date End Date Taking? Authorizing Provider  albuterol (VENTOLIN HFA) 108 (90 Base) MCG/ACT inhaler Inhale 2 puffs into the lungs every 6 (six) hours as needed for wheezing or shortness of breath.    [provider]  ALPRAZolam Duanne Moron) 0.5 MG tablet Take 0.5 mg by mouth at bedtime.    [provider]  BIOTIN PO Take 2,000 mcg by mouth every morning.    [provider]  Calcium Carb-Cholecalciferol (CALCIUM+D3 PO) Take 1 capsule by mouth every morning. Calcium 1200 mg, vitamin D 25 mcg/1000 units    [provider]  Cetirizine HCl (ZYRTEC ALLERGY) 10 MG CAPS Take 10 mg by mouth daily as needed (seasonal allergies). 12/08/16   [provider]  Cholecalciferol (VITAMIN D) 50 MCG (2000 UT) CAPS Take 2,000 Units by mouth every morning.    [provider]  citalopram (CELEXA) 20 MG tablet Take 20 mg by mouth every morning. 12/23/17   [provider]  fluticasone-salmeterol (ADVAIR HFA) 45-21 MCG/ACT inhaler Inhale 2 puffs into the lungs 2 (two) times daily. Patient  taking differently: Inhale 2 puffs into the lungs 2 (two) times daily as needed (shortness of breath). 02/26/20   Tanda Rockers, MD  folic acid (FOLVITE) 962 MCG tablet Take 800 mcg by mouth every morning.    [provider]  ibuprofen (ADVIL,MOTRIN) 200 MG tablet Take 400 mg by mouth every 6 (six) hours as needed for fever or headache (pain).    [provider]  levothyroxine (SYNTHROID, LEVOTHROID) 75 MCG tablet Take 75 mcg by mouth daily before breakfast.    [provider]  montelukast (SINGULAIR) 10 MG tablet TAKE 1 TABLET BY MOUTH EVERYDAY AT BEDTIME Patient taking differently: Take 10 mg by mouth at bedtime. 05/06/21   Tanda Rockers, MD  pantoprazole (PROTONIX) 40 MG tablet Take 1 tablet (40 mg total) by mouth daily. 06/17/21 07/17/21  Donne Hazel, MD  polyvinyl alcohol (LIQUIFILM TEARS) 1.4 % ophthalmic solution Place 1 drop into both eyes daily as needed for dry eyes.    [provider]  rosuvastatin (CRESTOR) 5 MG tablet Take 5 mg by mouth every other day. 02/08/21   [provider]      Allergies    Remicade [infliximab]    Review of Systems   Review of Systems  Gastrointestinal:  Positive for abdominal pain.  All other systems reviewed and are negative.   Physical Exam Updated Vital Signs BP (!) 168/95   Pulse 91   Temp 98.2 F (36.8 C) (Temporal)  Resp 18   Ht 5' 2.5" (1.588 m)   Wt 57.2 kg   SpO2 95%   BMI 22.68 kg/m  Physical Exam Vitals and nursing note reviewed.  Constitutional:      General: She is not in acute distress.    Appearance: She is well-developed.  HENT:     Head: Normocephalic and atraumatic.  Eyes:     Conjunctiva/sclera: Conjunctivae normal.  Cardiovascular:     Rate and Rhythm: Normal rate and regular rhythm.     Heart sounds: No murmur heard. Pulmonary:     Effort: Pulmonary effort is normal. No respiratory distress.     Breath sounds: Normal breath sounds.  Abdominal:     General:  Abdomen is flat.     Palpations: Abdomen is soft.     Tenderness: There is abdominal tenderness in the left upper quadrant and left lower quadrant.     Hernia: A hernia is present.  Musculoskeletal:        General: No swelling.     Cervical back: Neck supple.  Skin:    General: Skin is warm and dry.     Capillary Refill: Capillary refill takes less than 2 seconds.  Neurological:     Mental Status: She is alert.  Psychiatric:        Mood and Affect: Mood normal.     ED Results / Procedures / Treatments   Labs (all labs ordered are listed, but only abnormal results are displayed) Labs Reviewed  COMPREHENSIVE METABOLIC PANEL - Abnormal; Notable for the following components:      Result Value   Glucose, Bld 119 (*)    AST 14 (*)    All other components within normal limits  CBC - Abnormal; Notable for the following components:   WBC 16.5 (*)    All other components within normal limits  LIPASE, BLOOD  URINALYSIS, ROUTINE W REFLEX MICROSCOPIC    EKG None  Radiology No results found.  Procedures Procedures    Medications Ordered in ED Medications - No data to display  ED Course/ Medical Decision Making/ A&P                           Medical Decision Making Patient complains of left-sided abdominal pain and bloody diarrhea  Amount and/or Complexity of Data Reviewed External Data Reviewed: notes.    Details: Previous notes reviewed from November 2022 when patient was evaluated for diverticulitis Labs: ordered. Decision-making details documented in ED Course.    Details: Labs ordered reviewed and interpreted patient has an elevated white blood cell count of 16.5 Radiology: ordered.    Details: CT scan of abdomen and pelvis with contrast is ordered           Final Clinical Impression(s) / ED Diagnoses Final diagnoses:  Abdominal pain, left lower quadrant    Rx / DC Orders ED Discharge Orders     None         Sidney Ace 01/30/22  2130    Tegeler, Gwenyth Allegra, MD 01/31/22 [phone]

## 2022-01-30 NOTE — Discharge Instructions (Signed)
Followup with your Physicain for recheck next week.  Return if any problems.

## 2022-02-01 DIAGNOSIS — T7840XA Allergy, unspecified, initial encounter: Secondary | ICD-10-CM | POA: Diagnosis not present

## 2022-02-01 DIAGNOSIS — K5792 Diverticulitis of intestine, part unspecified, without perforation or abscess without bleeding: Secondary | ICD-10-CM | POA: Diagnosis not present

## 2022-02-04 DIAGNOSIS — L239 Allergic contact dermatitis, unspecified cause: Secondary | ICD-10-CM | POA: Diagnosis not present

## 2022-02-04 DIAGNOSIS — K529 Noninfective gastroenteritis and colitis, unspecified: Secondary | ICD-10-CM | POA: Diagnosis not present

## 2022-02-05 DIAGNOSIS — K529 Noninfective gastroenteritis and colitis, unspecified: Secondary | ICD-10-CM | POA: Diagnosis not present

## 2022-02-05 DIAGNOSIS — R112 Nausea with vomiting, unspecified: Secondary | ICD-10-CM | POA: Diagnosis not present

## 2022-02-05 DIAGNOSIS — R197 Diarrhea, unspecified: Secondary | ICD-10-CM | POA: Diagnosis not present

## 2022-02-06 DIAGNOSIS — K529 Noninfective gastroenteritis and colitis, unspecified: Secondary | ICD-10-CM | POA: Diagnosis not present

## 2022-02-06 DIAGNOSIS — R197 Diarrhea, unspecified: Secondary | ICD-10-CM | POA: Diagnosis not present

## 2022-02-06 DIAGNOSIS — R112 Nausea with vomiting, unspecified: Secondary | ICD-10-CM | POA: Diagnosis not present

## 2022-02-09 DIAGNOSIS — J4520 Mild intermittent asthma, uncomplicated: Secondary | ICD-10-CM | POA: Diagnosis not present

## 2022-02-09 DIAGNOSIS — M810 Age-related osteoporosis without current pathological fracture: Secondary | ICD-10-CM | POA: Diagnosis not present

## 2022-02-09 DIAGNOSIS — I10 Essential (primary) hypertension: Secondary | ICD-10-CM | POA: Diagnosis not present

## 2022-02-09 DIAGNOSIS — E039 Hypothyroidism, unspecified: Secondary | ICD-10-CM | POA: Diagnosis not present

## 2022-02-18 DIAGNOSIS — A0472 Enterocolitis due to Clostridium difficile, not specified as recurrent: Secondary | ICD-10-CM | POA: Diagnosis not present

## 2022-02-28 NOTE — Progress Notes (Unsigned)
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 and around the same time noted spring time "allergies" = nasal congestion / itchy eyes which overall has worsened over the years and more year round then new assoc cough started around 2000 but just put it up with it s specialty eval and started on advair in the early 2000s by Dr Sandi Mariscal did not help and started using albuterol which seemed to help some and taking more regularly since early June   2019 and referred to pulmonary clinic 12/29/2017 by Dr Pharr/ Titus Mould   History of Present Illness  12/29/2017 1st [facility] office visit/ [name]   Chief Complaint  Patient presents with   Pulmonary Consult    Referred by Dr. Deland Pretty. Pt c/o cough every spring allergy season for as long as she can remember. She states cough is mainly non prod but occ will produce some clear sputum. She has some PND.  She sometimes gets SOB when doing yard work or walking her dog at a fast pace. She uses her albuterol inhaler 2 x daily on average.   cough onset end of February 2019 worse ever since , mostly non prod, worse with laughing and after supper but not noct  Doe = MMRC1 = can walk nl pace, flat grade, can't hurry or go uphills or steps s sob   Says arthritis doing generally better while breathing worse  rec Prednisone 10 mg take  4 each am x 2 days,   2 each am x 2 days,  1 each am x 2 days and stop  Plan A = Automatic = symbicort 80 Take 2 puffs first thing in am and then another 2 puffs about 12 hours later.  Work on inhaler technique:  relax and gently blow all the way out then take a nice smooth deep breath back in, triggering the inhaler at same time you start breathing in.   Plan B = Backup Only use your albuterol as a rescue medication to be used if you can't catch your breath    NP 02/07/18   rx Symb > Breo due to insurance    05/02/2018  f/u ov/[name] re:  Cough variant asthma /  ? uacs ? RA lung dz  Chief Complaint  Patient presents with   Follow-up    States her breathing has been at her basline, cough has gotten better.   Dyspnea:  MMRC1 = can walk nl pace, flat grade, can't hurry or go uphills or steps s sob   Cough:  recurrent daytime  on Breo vs was completely gone while on symbicort/ cough with laughing  Sleeping: able to lie one pillow  SABA use: never  rec symb 80 or dulera 100 2 bid  > changed to BREO 100 by insurance but even it has high deductible    02/21/2019  f/u ov/[name] re: RA / chronic asthma   - off mtx  Chief Complaint  Patient presents with   Follow-up    Pt states she is now using Breo but doesnt use it consistently due to monthly cost. Pt states she maybe uses it once or twice a week but does notice an improvement in her breathing when she does use it. Pt states she rarely uses her albuterol. Pt c/o prod  cough with white mucus but states this is baseline for her. Pt denies CP/tightness and f/c/s.   Dyspnea:  Better p Breo Cough: hoarse / cough day > noct since using breo 100 regularly > mini white mucus production Sleeping: on side one pillow ok  SABA use: rarely 02: none  rec Plan A = Automatic = Dulera 100 mg Take  Up to 1- 2 puffs  Every 12 hours  Work on inhaler technique:    Plan B = Backup Only use your albuterol (ventolin)  Inhaler  If not happy with dulera 100 return to clinic with your drug formulary in hand (ok to substitute advair hfa    NP eval rec add singulair  08/28/19   alpha-1 was normal (genetic marker for COPD/emphysema). IgE was normal, Eosinophils were elevated at 600 which could be consistent with allergic asthma. Continue Advair and singulair and fu with Dr. Melvyn Novas in August    02/28/2021  f/u ov/[name] re: asthma  maint on advair 45  1-2 up to twice  daily and singulair Chief Complaint  Patient presents with   Follow-up     Patient reports she is here for follow up on cough,    Dyspnea:  more limited by balance than breathing / walks dog twice daily no hills  Cough: first thing in am, couple of coughs clears min mucoid sputum and minimal the rest of the day  Sleeping: flat bed, one pillow  SABA use: none  02: none  Covid status:   vax x 3  Rec Try off advair and singulair to see if any change in your symtpoms  Only use your albuterol as a rescue medication to be used if you can't catch your breath        03/02/2022  f/u ov/[name] re: cough x 2000  presumed cough variant asthma   maint on symbicort 160 prn  /singulair  Chief Complaint  Patient presents with   Follow-up    Cough has been better over the past few days. She has some PND. Cough is occ prod with minimal yellow to white sputum.   Dyspnea:  Not limited by breathing from desired activities   Cough: w/in 1st 15 min each am starts coughing with dog in bedroom  Sleeping: cough does not wake her up  SABA use: none  02: none     No obvious day to day or daytime variability or assoc excess/ purulent sputum or mucus plugs or hemoptysis or cp or chest tightness, subjective wheeze or overt sinus or hb symptoms.   sleeps without nocturnal  or early am exacerbation  of respiratory  c/o's or need for noct saba. Also denies any obvious fluctuation of symptoms with weather or environmental changes or other aggravating or alleviating factors except as outlined above   No unusual exposure hx or h/o childhood pna/ asthma or knowledge of premature birth.  Current Allergies, Complete Past Medical History, Past Surgical History, Family History, and Social History were reviewed in [employer] record.  ROS  The following are not active complaints unless bolded Hoarseness, sore throat, dysphagia, dental problems, itching, sneezing,  nasal congestion or discharge of excess mucus or purulent secretions, ear ache,   fever, chills, sweats,  unintended wt loss or wt gain, classically pleuritic or exertional cp,  orthopnea pnd or arm/hand swelling  or leg swelling, presyncope, palpitations, abdominal pain, anorexia, nausea, vomiting, diarrhea  or change in bowel habits or change in bladder habits, change in stools or  change in urine, dysuria, hematuria,  rash, arthralgias, visual complaints, headache, numbness, weakness or ataxia or problems with walking or coordination,  change in mood or  memory.        Current Meds  Medication Sig   albuterol (VENTOLIN HFA) 108 (90 Base) MCG/ACT inhaler Inhale 2 puffs into the lungs every 6 (six) hours as needed for wheezing or shortness of breath.   ALPRAZolam (XANAX) 0.5 MG tablet Take 0.5 mg by mouth at bedtime.   budesonide-formoterol (SYMBICORT) 160-4.5 MCG/ACT inhaler Inhale 2 puffs into the lungs 2 (two) times daily.   Calcium Carb-Cholecalciferol (CALCIUM+D3 PO) Take 1 capsule by mouth every morning. Calcium 1200 mg, vitamin D 25 mcg/1000 units   Cetirizine HCl (ZYRTEC ALLERGY) 10 MG CAPS Take 10 mg by mouth daily as needed (seasonal allergies).   Cholecalciferol (VITAMIN D) 50 MCG (2000 UT) CAPS Take 2,000 Units by mouth every morning.   citalopram (CELEXA) 20 MG tablet Take 20 mg by mouth every morning.   denosumab (PROLIA) 60 MG/ML SOSY injection Inject 60 mg into the skin every 6 (six) months.   ibuprofen (ADVIL,MOTRIN) 200 MG tablet Take 400 mg by mouth every 6 (six) hours as needed for fever or headache (pain).   levothyroxine (SYNTHROID, LEVOTHROID) 75 MCG tablet Take 75 mcg by mouth daily before breakfast.   polyvinyl alcohol (LIQUIFILM TEARS) 1.4 % ophthalmic solution Place 1 drop into both eyes daily as needed for dry eyes.   rosuvastatin (CRESTOR) 5 MG tablet Take 5 mg by mouth every other day.                Objective:   Physical Exam   Wts  03/02/2022         125  02/28/2021         135  02/26/2020         139 02/21/2019        144 05/02/2018      140   12/29/17 135 lb  (61.2 kg)  09/27/16 130 lb (59 kg)  08/27/15 122 lb (55.3 kg)      Vital signs reviewed  03/02/2022  - Note at rest 02 sats  95% on RA   General appearance:    amb slt hoarse wf nad   HEENT : Oropharynx  clear     Nasal turbinates nl    NECK :  without  apparent JVD/ palpable Nodes/TM    LUNGS: no acc muscle use,  Nl contour chest which is clear to A and P bilaterally without cough on insp or exp maneuvers   CV:  RRR  no s3 or murmur or increase in P2, and no edema   ABD:  soft and nontender with nl inspiratory excursion in the supine position. No bruits or organomegaly appreciated   MS:  Nl gait/ ext warm without deformities Or obvious joint restrictions  calf tenderness, cyanosis or clubbing    SKIN: warm and dry without lesions    NEURO:  alert, approp, nl sensorium with  no motor or cerebellar deficits apparent.                     Assessment:

## 2022-03-02 ENCOUNTER — Ambulatory Visit (INDEPENDENT_AMBULATORY_CARE_PROVIDER_SITE_OTHER): Payer: Medicare Other | Admitting: Internal Medicine

## 2022-03-02 ENCOUNTER — Encounter: Payer: Self-pay | Admitting: Internal Medicine

## 2022-03-02 VITALS — BP 112/74 | HR 91 | Temp 97.6°F | Ht 62.5 in | Wt 125.0 lb

## 2022-03-02 DIAGNOSIS — J45991 Cough variant asthma: Secondary | ICD-10-CM | POA: Diagnosis not present

## 2022-03-02 LAB — NITRIC OXIDE: Nitric Oxide: 42

## 2022-03-02 NOTE — Patient Instructions (Addendum)
Symbicort 160 Take 2 puffs first thing in am and then another 2 puffs about 12 hours later.  - stop after a week to see how you feel - if worse start back  Keep pets out of the bedroom   If not satisfied, return with your inhaler and  we'll regroup

## 2022-03-03 ENCOUNTER — Encounter: Payer: Self-pay | Admitting: Internal Medicine

## 2022-03-03 NOTE — Assessment & Plan Note (Addendum)
Never smoker 12/29/2017  Walked RA x 3 laps @ 185 ft each stopped due to  End of study,fast pace, no sob or desat   Spirometry 12/29/2017  FEV1 1.93 (88%)  Ratio 71 p saba w/in 4 h prior - Allergy profile 12/29/2017 >  Eos 0.6 /  IgE  22 RAST pos  cat > dog and dust  - FENO 12/29/2017  =   36 - 12/29/2017  After extensive coaching inhaler device  effectiveness =    75% > try symbicort 80 2bid - PFT's  01/28/18  FEV1 1.53 (68 % ) ratio 62  p 0 % improvement from saba p ?  prior to study with DLCO 68/67c% corrects to 88 % for alv volume  - changed to breo 02/07/18 due to insurance but cough recurred  - 05/02/2018  After extensive coaching inhaler device,  effectiveness =    90% with hfa > resume symbicort  - PFT's  02/21/2019  FEV1 1.61 (74 % ) ratio 0.69  p 11 % improvement from saba p 0 prior to study with DLCO  14.80 (78%) corrects to 3.59 (86%)  for alv volume and FV curve mild convexity - DgEs   07/30/21      Small HH    - 03/02/2022  FENO  = 42 on symbicort 160 prn  - 03/02/2022  After extensive coaching inhaler device,  effectiveness =    75% try symb 160 2bid x one week and then next week off to see if better or worse to sort out asthma vs UACS   She has elements of allergyic rhinitis and asthma but the absence during ssleep is more typical of Upper airway cough syndrome (previously labeled PNDS),  is so named because it's frequently impossible to sort out how much is  CR/sinusitis with freq throat clearing (which can be related to primary GERD)   vs  causing  secondary (" extra esophageal")  GERD from wide swings in gastric pressure that occur with throat clearing, often  promoting self use of mint and menthol lozenges that reduce the lower esophageal sphincter tone and exacerbate the problem further in a cyclical fashion.   These are the same pts (now being labeled as having "irritable larynx syndrome" by some cough centers) who not infrequently have a history of having failed to tolerate ace inhibitors,   dry powder inhalers or biphosphonates or report having atypical/extraesophageal reflux symptoms that don't respond to standard doses of PPI  and are easily confused as having aecopd or asthma flares by even experienced allergists/ pulmonologists (myself included).   rec full dose symbicort x at least a week then trial off as a reverse therapeutic measure and if finds does wors off than on I would rec the lower dose ICS so as not to trigger the UACS component.   Discussed in detail all the  indications, usual  risks and alternatives  relative to the benefits with patient who agrees to proceed with Rx as outlined.             Each maintenance medication was reviewed in detail including emphasizing most importantly the difference between maintenance and prns and under what circumstances the prns are to be triggered using an action plan format where appropriate.  Total time for H and P, chart review, counseling, reviewing hfa  device(s) and generating customized AVS unique to this office visit / same day charting = 76mn

## 2022-03-04 DIAGNOSIS — M0589 Other rheumatoid arthritis with rheumatoid factor of multiple sites: Secondary | ICD-10-CM | POA: Diagnosis not present

## 2022-03-12 DIAGNOSIS — M810 Age-related osteoporosis without current pathological fracture: Secondary | ICD-10-CM | POA: Diagnosis not present

## 2022-03-12 DIAGNOSIS — J4520 Mild intermittent asthma, uncomplicated: Secondary | ICD-10-CM | POA: Diagnosis not present

## 2022-03-12 DIAGNOSIS — E039 Hypothyroidism, unspecified: Secondary | ICD-10-CM | POA: Diagnosis not present

## 2022-03-12 DIAGNOSIS — I10 Essential (primary) hypertension: Secondary | ICD-10-CM | POA: Diagnosis not present

## 2022-03-18 DIAGNOSIS — H938X2 Other specified disorders of left ear: Secondary | ICD-10-CM | POA: Diagnosis not present

## 2022-04-08 DIAGNOSIS — N182 Chronic kidney disease, stage 2 (mild): Secondary | ICD-10-CM | POA: Diagnosis not present

## 2022-04-08 DIAGNOSIS — M3500 Sicca syndrome, unspecified: Secondary | ICD-10-CM | POA: Diagnosis not present

## 2022-04-08 DIAGNOSIS — M25512 Pain in left shoulder: Secondary | ICD-10-CM | POA: Diagnosis not present

## 2022-04-08 DIAGNOSIS — M0589 Other rheumatoid arthritis with rheumatoid factor of multiple sites: Secondary | ICD-10-CM | POA: Diagnosis not present

## 2022-04-08 DIAGNOSIS — M150 Primary generalized (osteo)arthritis: Secondary | ICD-10-CM | POA: Diagnosis not present

## 2022-04-08 DIAGNOSIS — M549 Dorsalgia, unspecified: Secondary | ICD-10-CM | POA: Diagnosis not present

## 2022-04-08 DIAGNOSIS — Z79899 Other long term (current) drug therapy: Secondary | ICD-10-CM | POA: Diagnosis not present

## 2022-04-08 DIAGNOSIS — Z23 Encounter for immunization: Secondary | ICD-10-CM | POA: Diagnosis not present

## 2022-04-08 DIAGNOSIS — M4125 Other idiopathic scoliosis, thoracolumbar region: Secondary | ICD-10-CM | POA: Diagnosis not present

## 2022-04-08 DIAGNOSIS — M25511 Pain in right shoulder: Secondary | ICD-10-CM | POA: Diagnosis not present

## 2022-04-08 DIAGNOSIS — M810 Age-related osteoporosis without current pathological fracture: Secondary | ICD-10-CM | POA: Diagnosis not present

## 2022-04-21 DIAGNOSIS — M810 Age-related osteoporosis without current pathological fracture: Secondary | ICD-10-CM | POA: Diagnosis not present

## 2022-04-28 DIAGNOSIS — H35343 Macular cyst, hole, or pseudohole, bilateral: Secondary | ICD-10-CM | POA: Diagnosis not present

## 2022-04-29 DIAGNOSIS — M0589 Other rheumatoid arthritis with rheumatoid factor of multiple sites: Secondary | ICD-10-CM | POA: Diagnosis not present

## 2022-05-13 DIAGNOSIS — Z8619 Personal history of other infectious and parasitic diseases: Secondary | ICD-10-CM | POA: Diagnosis not present

## 2022-05-20 DIAGNOSIS — M19112 Post-traumatic osteoarthritis, left shoulder: Secondary | ICD-10-CM | POA: Diagnosis not present

## 2022-05-27 DIAGNOSIS — M19112 Post-traumatic osteoarthritis, left shoulder: Secondary | ICD-10-CM | POA: Diagnosis not present

## 2022-06-01 ENCOUNTER — Telehealth: Payer: Self-pay | Admitting: Internal Medicine

## 2022-06-01 DIAGNOSIS — Z01818 Encounter for other preprocedural examination: Secondary | ICD-10-CM | POA: Diagnosis not present

## 2022-06-01 NOTE — Telephone Encounter (Signed)
Called and let patient know that as soon as I get the paperwork in regards to her surgery I will let her know. I told her I was not sure yet about when she needs to follow up with Dr Melvyn Novas since we are unsure what is the date of her surgery. I told her that it has to be within 60-90 days of her surgery for the follow up. I advised her that I will be on the look out for her paperwork. Nothing further needed

## 2022-06-01 NOTE — Telephone Encounter (Signed)
Called and left voicemail for patient to call office back with the name and location she is having her surgery done at. I need to give them a call for them to fax over paperwork for her surgery. But she would need an office visit for Dr Melvyn Novas or an APP. She was last seen in August 2023.

## 2022-06-11 DIAGNOSIS — M810 Age-related osteoporosis without current pathological fracture: Secondary | ICD-10-CM | POA: Diagnosis not present

## 2022-06-11 DIAGNOSIS — J4520 Mild intermittent asthma, uncomplicated: Secondary | ICD-10-CM | POA: Diagnosis not present

## 2022-06-11 DIAGNOSIS — I10 Essential (primary) hypertension: Secondary | ICD-10-CM | POA: Diagnosis not present

## 2022-06-11 DIAGNOSIS — E039 Hypothyroidism, unspecified: Secondary | ICD-10-CM | POA: Diagnosis not present

## 2022-06-24 DIAGNOSIS — M0589 Other rheumatoid arthritis with rheumatoid factor of multiple sites: Secondary | ICD-10-CM | POA: Diagnosis not present

## 2022-06-30 ENCOUNTER — Telehealth: Payer: Self-pay | Admitting: Internal Medicine

## 2022-06-30 NOTE — Telephone Encounter (Signed)
Patient replied on mychart and states that she will call our office if she does decide to proceed with the surgery. She states she is unsure of doing it right now. I will keep surgical clearance form in folder. Nothing further needed

## 2022-06-30 NOTE — Telephone Encounter (Signed)
Fax received from Dr. Tania Ade with [facility] to perform a LEFT REVERSE TOTAL SHOULDER ARTHROPLASTY on patient.  Patient needs surgery clearance. Surgery is PENDING. Patient was seen on 03/02/2022. Office protocol is a risk assessment can be sent to surgeon if patient has been seen in 60 days or less.   Sending to DR. Wert for risk assessment or recommendations if patient needs to be seen in office prior to surgical procedure.    Sent mychart message to patient today. Even called her and left voicemail for her to make office visit for clearance

## 2022-07-21 DIAGNOSIS — H26492 Other secondary cataract, left eye: Secondary | ICD-10-CM | POA: Diagnosis not present

## 2022-07-21 DIAGNOSIS — Z961 Presence of intraocular lens: Secondary | ICD-10-CM | POA: Diagnosis not present

## 2022-07-21 DIAGNOSIS — H18593 Other hereditary corneal dystrophies, bilateral: Secondary | ICD-10-CM | POA: Diagnosis not present

## 2022-07-21 DIAGNOSIS — H18413 Arcus senilis, bilateral: Secondary | ICD-10-CM | POA: Diagnosis not present

## 2022-07-28 DIAGNOSIS — Z961 Presence of intraocular lens: Secondary | ICD-10-CM | POA: Diagnosis not present

## 2022-08-13 DIAGNOSIS — Z79899 Other long term (current) drug therapy: Secondary | ICD-10-CM | POA: Diagnosis not present

## 2022-08-13 DIAGNOSIS — M3500 Sicca syndrome, unspecified: Secondary | ICD-10-CM | POA: Diagnosis not present

## 2022-08-13 DIAGNOSIS — M25512 Pain in left shoulder: Secondary | ICD-10-CM | POA: Diagnosis not present

## 2022-08-13 DIAGNOSIS — M4125 Other idiopathic scoliosis, thoracolumbar region: Secondary | ICD-10-CM | POA: Diagnosis not present

## 2022-08-13 DIAGNOSIS — M810 Age-related osteoporosis without current pathological fracture: Secondary | ICD-10-CM | POA: Diagnosis not present

## 2022-08-13 DIAGNOSIS — N182 Chronic kidney disease, stage 2 (mild): Secondary | ICD-10-CM | POA: Diagnosis not present

## 2022-08-13 DIAGNOSIS — M0589 Other rheumatoid arthritis with rheumatoid factor of multiple sites: Secondary | ICD-10-CM | POA: Diagnosis not present

## 2022-08-13 DIAGNOSIS — M549 Dorsalgia, unspecified: Secondary | ICD-10-CM | POA: Diagnosis not present

## 2022-08-13 DIAGNOSIS — M150 Primary generalized (osteo)arthritis: Secondary | ICD-10-CM | POA: Diagnosis not present

## 2022-08-19 DIAGNOSIS — M0589 Other rheumatoid arthritis with rheumatoid factor of multiple sites: Secondary | ICD-10-CM | POA: Diagnosis not present

## 2022-09-17 DIAGNOSIS — M810 Age-related osteoporosis without current pathological fracture: Secondary | ICD-10-CM | POA: Diagnosis not present

## 2022-09-17 DIAGNOSIS — E039 Hypothyroidism, unspecified: Secondary | ICD-10-CM | POA: Diagnosis not present

## 2022-09-17 DIAGNOSIS — I2584 Coronary atherosclerosis due to calcified coronary lesion: Secondary | ICD-10-CM | POA: Diagnosis not present

## 2022-09-24 DIAGNOSIS — I700 Atherosclerosis of aorta: Secondary | ICD-10-CM | POA: Diagnosis not present

## 2022-09-24 DIAGNOSIS — I2510 Atherosclerotic heart disease of native coronary artery without angina pectoris: Secondary | ICD-10-CM | POA: Diagnosis not present

## 2022-09-24 DIAGNOSIS — Z23 Encounter for immunization: Secondary | ICD-10-CM | POA: Diagnosis not present

## 2022-09-24 DIAGNOSIS — F339 Major depressive disorder, recurrent, unspecified: Secondary | ICD-10-CM | POA: Diagnosis not present

## 2022-09-24 DIAGNOSIS — M3500 Sicca syndrome, unspecified: Secondary | ICD-10-CM | POA: Diagnosis not present

## 2022-09-24 DIAGNOSIS — Z0000 Encounter for general adult medical examination without abnormal findings: Secondary | ICD-10-CM | POA: Diagnosis not present

## 2022-09-24 DIAGNOSIS — E559 Vitamin D deficiency, unspecified: Secondary | ICD-10-CM | POA: Diagnosis not present

## 2022-09-24 DIAGNOSIS — D849 Immunodeficiency, unspecified: Secondary | ICD-10-CM | POA: Diagnosis not present

## 2022-09-24 DIAGNOSIS — J45991 Cough variant asthma: Secondary | ICD-10-CM | POA: Diagnosis not present

## 2022-09-24 DIAGNOSIS — E039 Hypothyroidism, unspecified: Secondary | ICD-10-CM | POA: Diagnosis not present

## 2022-09-24 DIAGNOSIS — M810 Age-related osteoporosis without current pathological fracture: Secondary | ICD-10-CM | POA: Diagnosis not present

## 2022-09-24 DIAGNOSIS — R911 Solitary pulmonary nodule: Secondary | ICD-10-CM | POA: Diagnosis not present

## 2022-10-22 DIAGNOSIS — M810 Age-related osteoporosis without current pathological fracture: Secondary | ICD-10-CM | POA: Diagnosis not present

## 2022-10-22 DIAGNOSIS — M0589 Other rheumatoid arthritis with rheumatoid factor of multiple sites: Secondary | ICD-10-CM | POA: Diagnosis not present

## 2022-10-30 DIAGNOSIS — M0589 Other rheumatoid arthritis with rheumatoid factor of multiple sites: Secondary | ICD-10-CM | POA: Diagnosis not present

## 2022-10-30 DIAGNOSIS — M4125 Other idiopathic scoliosis, thoracolumbar region: Secondary | ICD-10-CM | POA: Diagnosis not present

## 2022-10-30 DIAGNOSIS — N182 Chronic kidney disease, stage 2 (mild): Secondary | ICD-10-CM | POA: Diagnosis not present

## 2022-10-30 DIAGNOSIS — M3500 Sicca syndrome, unspecified: Secondary | ICD-10-CM | POA: Diagnosis not present

## 2022-10-30 DIAGNOSIS — M150 Primary generalized (osteo)arthritis: Secondary | ICD-10-CM | POA: Diagnosis not present

## 2022-10-30 DIAGNOSIS — M25512 Pain in left shoulder: Secondary | ICD-10-CM | POA: Diagnosis not present

## 2022-10-30 DIAGNOSIS — M810 Age-related osteoporosis without current pathological fracture: Secondary | ICD-10-CM | POA: Diagnosis not present

## 2022-10-30 DIAGNOSIS — M549 Dorsalgia, unspecified: Secondary | ICD-10-CM | POA: Diagnosis not present

## 2022-10-30 DIAGNOSIS — Z79899 Other long term (current) drug therapy: Secondary | ICD-10-CM | POA: Diagnosis not present

## 2022-11-03 DIAGNOSIS — F339 Major depressive disorder, recurrent, unspecified: Secondary | ICD-10-CM | POA: Diagnosis not present

## 2022-11-16 DIAGNOSIS — H93291 Other abnormal auditory perceptions, right ear: Secondary | ICD-10-CM | POA: Diagnosis not present

## 2022-11-16 DIAGNOSIS — H938X1 Other specified disorders of right ear: Secondary | ICD-10-CM | POA: Diagnosis not present

## 2022-11-19 DIAGNOSIS — H93A9 Pulsatile tinnitus, unspecified ear: Secondary | ICD-10-CM | POA: Diagnosis not present

## 2022-11-23 DIAGNOSIS — H93A1 Pulsatile tinnitus, right ear: Secondary | ICD-10-CM | POA: Diagnosis not present

## 2022-11-23 DIAGNOSIS — H903 Sensorineural hearing loss, bilateral: Secondary | ICD-10-CM | POA: Diagnosis not present

## 2022-12-17 DIAGNOSIS — M0589 Other rheumatoid arthritis with rheumatoid factor of multiple sites: Secondary | ICD-10-CM | POA: Diagnosis not present

## 2022-12-24 DIAGNOSIS — F339 Major depressive disorder, recurrent, unspecified: Secondary | ICD-10-CM | POA: Diagnosis not present

## 2023-01-13 DIAGNOSIS — M0589 Other rheumatoid arthritis with rheumatoid factor of multiple sites: Secondary | ICD-10-CM | POA: Diagnosis not present

## 2023-01-13 DIAGNOSIS — Z79899 Other long term (current) drug therapy: Secondary | ICD-10-CM | POA: Diagnosis not present

## 2023-01-13 DIAGNOSIS — N182 Chronic kidney disease, stage 2 (mild): Secondary | ICD-10-CM | POA: Diagnosis not present

## 2023-01-13 DIAGNOSIS — M150 Primary generalized (osteo)arthritis: Secondary | ICD-10-CM | POA: Diagnosis not present

## 2023-01-13 DIAGNOSIS — M25512 Pain in left shoulder: Secondary | ICD-10-CM | POA: Diagnosis not present

## 2023-01-13 DIAGNOSIS — M3500 Sicca syndrome, unspecified: Secondary | ICD-10-CM | POA: Diagnosis not present

## 2023-01-13 DIAGNOSIS — M4125 Other idiopathic scoliosis, thoracolumbar region: Secondary | ICD-10-CM | POA: Diagnosis not present

## 2023-01-13 DIAGNOSIS — M810 Age-related osteoporosis without current pathological fracture: Secondary | ICD-10-CM | POA: Diagnosis not present

## 2023-01-13 DIAGNOSIS — M549 Dorsalgia, unspecified: Secondary | ICD-10-CM | POA: Diagnosis not present

## 2023-02-11 DIAGNOSIS — M0589 Other rheumatoid arthritis with rheumatoid factor of multiple sites: Secondary | ICD-10-CM | POA: Diagnosis not present

## 2023-03-29 DIAGNOSIS — M810 Age-related osteoporosis without current pathological fracture: Secondary | ICD-10-CM | POA: Diagnosis not present

## 2023-03-31 DIAGNOSIS — M810 Age-related osteoporosis without current pathological fracture: Secondary | ICD-10-CM | POA: Diagnosis not present

## 2023-03-31 DIAGNOSIS — F339 Major depressive disorder, recurrent, unspecified: Secondary | ICD-10-CM | POA: Diagnosis not present

## 2023-04-08 DIAGNOSIS — M0589 Other rheumatoid arthritis with rheumatoid factor of multiple sites: Secondary | ICD-10-CM | POA: Diagnosis not present

## 2023-04-26 DIAGNOSIS — M810 Age-related osteoporosis without current pathological fracture: Secondary | ICD-10-CM | POA: Diagnosis not present

## 2023-05-17 DIAGNOSIS — Z79899 Other long term (current) drug therapy: Secondary | ICD-10-CM | POA: Diagnosis not present

## 2023-05-17 DIAGNOSIS — M4125 Other idiopathic scoliosis, thoracolumbar region: Secondary | ICD-10-CM | POA: Diagnosis not present

## 2023-05-17 DIAGNOSIS — M150 Primary generalized (osteo)arthritis: Secondary | ICD-10-CM | POA: Diagnosis not present

## 2023-05-17 DIAGNOSIS — M0589 Other rheumatoid arthritis with rheumatoid factor of multiple sites: Secondary | ICD-10-CM | POA: Diagnosis not present

## 2023-05-17 DIAGNOSIS — N182 Chronic kidney disease, stage 2 (mild): Secondary | ICD-10-CM | POA: Diagnosis not present

## 2023-05-17 DIAGNOSIS — R059 Cough, unspecified: Secondary | ICD-10-CM | POA: Diagnosis not present

## 2023-05-17 DIAGNOSIS — M810 Age-related osteoporosis without current pathological fracture: Secondary | ICD-10-CM | POA: Diagnosis not present

## 2023-05-17 DIAGNOSIS — M3500 Sicca syndrome, unspecified: Secondary | ICD-10-CM | POA: Diagnosis not present

## 2023-05-26 DIAGNOSIS — Z8619 Personal history of other infectious and parasitic diseases: Secondary | ICD-10-CM | POA: Diagnosis not present

## 2023-05-26 DIAGNOSIS — M0589 Other rheumatoid arthritis with rheumatoid factor of multiple sites: Secondary | ICD-10-CM | POA: Diagnosis not present

## 2023-05-26 DIAGNOSIS — R053 Chronic cough: Secondary | ICD-10-CM | POA: Diagnosis not present

## 2023-06-03 DIAGNOSIS — M0589 Other rheumatoid arthritis with rheumatoid factor of multiple sites: Secondary | ICD-10-CM | POA: Diagnosis not present

## 2023-06-09 ENCOUNTER — Encounter (INDEPENDENT_AMBULATORY_CARE_PROVIDER_SITE_OTHER): Payer: Self-pay

## 2023-06-09 ENCOUNTER — Ambulatory Visit (INDEPENDENT_AMBULATORY_CARE_PROVIDER_SITE_OTHER): Payer: Medicare Other | Admitting: Otolaryngology

## 2023-06-09 VITALS — Ht 63.0 in | Wt 122.0 lb

## 2023-06-09 DIAGNOSIS — H903 Sensorineural hearing loss, bilateral: Secondary | ICD-10-CM

## 2023-06-09 DIAGNOSIS — H93A1 Pulsatile tinnitus, right ear: Secondary | ICD-10-CM | POA: Diagnosis not present

## 2023-06-11 DIAGNOSIS — H903 Sensorineural hearing loss, bilateral: Secondary | ICD-10-CM | POA: Insufficient documentation

## 2023-06-11 NOTE — Progress Notes (Signed)
Patient ID: [name], female   DOB: [birth year], 77 y.o.   MRN: [id number]  CC: Right ear pulsatile tinnitus, bilateral hearing loss  HPI: The patient is a 77 year old female who presents today complaining of right ear pulsatile tinnitus for 1 year.  The tinnitus is described as a heartbeat sound that is synchronized to her heart rate.  She was seen at [facility] in May 2024.  At that time, she was noted to have bilateral high-frequency sensorineural hearing loss.  Her tympanogram was normal.  No treatment was rendered.  The patient denies any otalgia or otorrhea.  She underwent adenotonsillectomy surgery as a child.  She has no other ENT surgery.  Exam: General: Communicates without difficulty, well nourished, no acute distress. Head: Normocephalic, no evidence injury, no tenderness, facial buttresses intact without stepoff. Face/sinus: No tenderness to palpation and percussion. Facial movement is normal and symmetric. Eyes: PERRL, EOMI. No scleral icterus, conjunctivae clear. Neuro: CN II exam reveals vision grossly intact.  No nystagmus at any point of gaze. Ears: Auricles well formed without lesions.  Ear canals are intact without mass or lesion.  No erythema or edema is appreciated.  The TMs are intact without fluid. Nose: External evaluation reveals normal support and skin without lesions.  Dorsum is intact.  Anterior rhinoscopy reveals congested mucosa over anterior aspect of inferior turbinates and intact septum.  No purulence noted. Oral:  Oral cavity and oropharynx are intact, symmetric, without erythema or edema.  Mucosa is moist without lesions. Neck: Full range of motion without pain.  There is no significant lymphadenopathy.  No masses palpable.  Thyroid bed within normal limits to palpation.  Parotid glands and submandibular glands equal bilaterally without mass.  Trachea is midline. Neuro:  CN 2-12 grossly intact.   Assessment: 1.  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   The possible differential diagnoses include vascular tumor, AV malformation, hyperdynamic state, aneurysm, sinus thrombosis, or other idiopathic causes.   2.  Bilateral high-frequency sensorineural hearing loss, likely secondary to presbycusis. 3.  Her ear canals, tympanic membranes, and middle ear spaces are normal.  Plan: 1.  The physical exam findings are reviewed with the patient. 2.  The strategies to cope with tinnitus, including the use of masker, hearing aids, tinnitus retraining therapy, and avoidance of caffeine and alcohol are discussed.  3.  MRI scan/CT angiography to evaluate for possible causes of her pulsatile tinnitus. 4.  The patient will return for reevaluation after her imaging studies.

## 2023-06-16 DIAGNOSIS — R053 Chronic cough: Secondary | ICD-10-CM | POA: Diagnosis not present

## 2023-06-16 DIAGNOSIS — H93A9 Pulsatile tinnitus, unspecified ear: Secondary | ICD-10-CM | POA: Diagnosis not present

## 2023-06-22 ENCOUNTER — Telehealth: Payer: Self-pay | Admitting: Cardiology

## 2023-06-22 ENCOUNTER — Ambulatory Visit (HOSPITAL_COMMUNITY)
Admission: RE | Admit: 2023-06-22 | Discharge: 2023-06-22 | Disposition: A | Discharge status: Home / Self Care | Payer: Medicare Other | Source: Ambulatory Visit | Attending: Otolaryngology | Admitting: Otolaryngology

## 2023-06-22 DIAGNOSIS — H93A1 Pulsatile tinnitus, right ear: Secondary | ICD-10-CM | POA: Diagnosis not present

## 2023-06-22 DIAGNOSIS — H9319 Tinnitus, unspecified ear: Secondary | ICD-10-CM | POA: Diagnosis not present

## 2023-06-22 DIAGNOSIS — I6350 Cerebral infarction due to unspecified occlusion or stenosis of unspecified cerebral artery: Secondary | ICD-10-CM | POA: Diagnosis not present

## 2023-06-22 MED ORDER — GADOBUTROL 1 MMOL/ML IV SOLN
5.5000 mL | Freq: Once | INTRAVENOUS | Status: AC | PRN
  Administered 2023-06-22: 5.5 mL via INTRAVENOUS

## 2023-06-22 NOTE — Telephone Encounter (Signed)
Patient called to order stress test.   She has not been seen in over 2 years. Former Dr. Shari Prows   Scheduling can you guys call ans set he rup with an appointment with a provider who is accepting new patients. Thank you

## 2023-06-22 NOTE — Telephone Encounter (Signed)
Patient calling in about have a stress test done. Please advise

## 2023-06-23 NOTE — Progress Notes (Unsigned)
Cardiology Office Note:   Date:  06/24/2023  ID:  [name], DOB 01-10-1946, MRN [id number] PCP: Caitlin Brunette, MD  [name] Providers Cardiologist:  Caitlin Rotunda, MD {  History of Present Illness:   [name] is a 77 y.o. female with a hx of depression, RA and hypothyroidism who was referred by [name] for further evaluation of an abnormal ECG.  She [name].   I saw her in the distant past for management of systolic HF.   When I saw her she reported that she did not want to take the "bunch of pills I gave her."  EF was last 45%.  When [name] saw her two years ago she suggested a TTE, Lexiscan Myoview and coronary calcium score and it does not seem that the patient did any of these tests.   She called yesterday requesting a stress test.   She is needing shoulder surgery.  She is referred essentially for preop evaluation.  She has bad arthritis and needs to have left shoulder replacement.  She does not have any symptoms.  She denies any shortness of breath, PND or orthopnea.  She does not have any palpitations, presyncope or syncope.  Admittedly she is not overly active that she takes care of her own house.  She has some daytime fatigue.  She never wanted to take the medicines because she thought it made her fatigued.  She did not want to have a follow-up because one of her test was scheduled very early in the morning and she just dropped the whole thing.  She says now she would consent to the testing because she needs to get her shoulder surgery and she would consent to slow and gentle med titration.  ROS: As stated in the HPI and negative for all other systems.  Studies Reviewed:    EKG:   EKG Interpretation Date/Time:  Thursday June 24 2023 13:43:20 EST Ventricular Rate:  87 PR Interval:  146 QRS Duration:  94 QT Interval:  382 QTC Calculation: 459 R Axis:   -40  Text Interpretation: Normal sinus rhythm Left axis deviation Left ventricular  hypertrophy with repolarization abnormality ( R in aVL , Cornell product ) Cannot rule out Septal infarct , age undetermined When compared with ECG of 09-Jun-2021 08:31, No significant change since last tracing Confirmed by [name] ([id number]) on 06/24/2023 2:15:39 PM    Risk Assessment/Calculations:           Physical Exam:   VS:  BP (!) 143/92 (BP Location: Right Arm, Patient Position: Sitting)   Pulse 87   Ht 5\' 3"  (1.6 m)   Wt 121 lb (54.9 kg)   SpO2 92%   BMI 21.43 kg/m    Wt Readings from Last 3 Encounters:  06/24/23 121 lb (54.9 kg)  06/09/23 122 lb (55.3 kg)  03/02/22 125 lb (56.7 kg)     GEN: Well nourished, well developed in no acute distress NECK: No JVD; No carotid bruits CARDIAC: RRR, no murmurs, rubs, gallops RESPIRATORY:  Clear to auscultation without rales, wheezing or rhonchi  ABDOMEN: Soft, non-tender, non-distended EXTREMITIES:  No edema; No deformity   ASSESSMENT AND PLAN:   Abnormal EKG: This will be evaluated with a Lexiscan Myoview.  This is also the workup for her cardiomyopathy to see if there is any evidence of an ischemic cardiomyopathy.      Cardiomyopathy: I will check an echocardiogram.  She agrees to start a very low-dose beta-blocker with  very slow med titration.  I will start carvedilol 3.125 mg twice daily.  She is euvolemic.  Preop: I will get the Lexiscan Myoview above and look for high risk features.  She has no high risk symptoms and has a reasonable functional level.  We will bring her back following the testing to complete the preoperative evaluation.  Follow up with APP in 1 month  Signed, Caitlin Rotunda, MD

## 2023-06-24 ENCOUNTER — Encounter: Payer: Self-pay | Admitting: Cardiology

## 2023-06-24 ENCOUNTER — Ambulatory Visit: Payer: Medicare Other | Attending: Cardiology | Admitting: Cardiology

## 2023-06-24 VITALS — BP 143/92 | HR 87 | Ht 63.0 in | Wt 121.0 lb

## 2023-06-24 DIAGNOSIS — Z01810 Encounter for preprocedural cardiovascular examination: Secondary | ICD-10-CM | POA: Diagnosis not present

## 2023-06-24 DIAGNOSIS — F419 Anxiety disorder, unspecified: Secondary | ICD-10-CM | POA: Diagnosis not present

## 2023-06-24 MED ORDER — CARVEDILOL 3.125 MG PO TABS: 3.1250 mg | ORAL_TABLET | Freq: Two times a day (BID) | ORAL | 3 refills | Status: DC

## 2023-06-24 NOTE — Patient Instructions (Addendum)
Medication Instructions:  Start taking carvedilol 3.125 mg by mouth twice per day. New script sent. *If you need a refill on your cardiac medications before your next appointment, please call your pharmacy*  Testing/Procedures: Your physician has requested that you have an echocardiogram. Echocardiography is a painless test that uses sound waves to create images of your heart. It provides your doctor with information about the size and shape of your heart and how well your heart's chambers and valves are working. This procedure takes approximately one hour. There are no restrictions for this procedure. Please do NOT wear cologne, perfume, aftershave, or lotions (deodorant is allowed). Please arrive 15 minutes prior to your appointment time. [address].   Please note: We ask at that you not bring children with you during ultrasound (echo/ vascular) testing. Due to room size and safety concerns, children are not allowed in the ultrasound rooms during exams. Our front office staff cannot provide observation of children in our lobby area while testing is being conducted. An adult accompanying a patient to their appointment will only be allowed in the ultrasound room at the discretion of the ultrasound technician under special circumstances. We apologize for any inconvenience.    Your physician has requested that you have a lexiscan myoview. For further information please visit [email or web address] Please follow instruction sheet, as given. This will take place at [address]  How to prepare for your Myocardial Perfusion Test: Do not eat or drink 3 hours prior to your test, except you may have water. Do not consume products containing caffeine (regular or decaffeinated) 12 hours prior to your test. (ex: coffee, chocolate, sodas, tea). Do bring a list of your current medications with you.  If not listed below, you may take your medications as normal. Do wear comfortable clothes (no  dresses or overalls) and walking shoes, tennis shoes preferred (No heels or open toe shoes are allowed). Do NOT wear cologne, perfume, aftershave, or lotions (deodorant is allowed). The test will take approximately 3 to 4 hours to complete If these instructions are not followed, your test will have to be rescheduled.   Follow-Up: At [facility], you and your health needs are our priority.  As part of our continuing mission to provide you with exceptional heart care, we have created designated Provider Care Teams.  These Care Teams include your primary Cardiologist (physician) and Advanced Practice Providers (APPs -  Physician Assistants and Nurse Practitioners) who all work together to provide you with the care you need, when you need it.  We recommend signing up for the patient portal called "MyChart".  Sign up information is provided on this After Visit Summary.  MyChart is used to connect with patients for Virtual Visits (Telemedicine).  Patients are able to view lab/test results, encounter notes, upcoming appointments, etc.  Non-urgent messages can be sent to your provider as well.   To learn more about what you can do with MyChart, go to [email or web address].    Your next appointment:   1 month(s)  Provider:   Marjie Skiff, PA-C    (Or first available APP if needed.)

## 2023-07-01 ENCOUNTER — Ambulatory Visit (HOSPITAL_COMMUNITY): Payer: Medicare Other | Attending: Cardiology

## 2023-07-01 DIAGNOSIS — Z01810 Encounter for preprocedural cardiovascular examination: Secondary | ICD-10-CM | POA: Insufficient documentation

## 2023-07-01 LAB — MYOCARDIAL PERFUSION IMAGING
Base ST Depression (mm): 0 mm
LV dias vol: 76 mL (ref 46–106)
LV sys vol: 32 mL
Nuc Stress EF: 58 %
Peak HR: 96 {beats}/min
Rest HR: 71 {beats}/min
Rest Nuclear Isotope Dose: 10.7 mCi
SDS: 3
SRS: 0
SSS: 3
ST Depression (mm): 0 mm
Stress Nuclear Isotope Dose: 32.1 mCi
TID: 1.04

## 2023-07-01 MED ORDER — REGADENOSON 0.4 MG/5ML IV SOLN
0.4000 mg | Freq: Once | INTRAVENOUS | Status: AC
  Administered 2023-07-01: 0.4 mg via INTRAVENOUS

## 2023-07-01 MED ORDER — TECHNETIUM TC 99M TETROFOSMIN IV KIT
10.7000 | PACK | Freq: Once | INTRAVENOUS | Status: AC | PRN
  Administered 2023-07-01: 10.7 via INTRAVENOUS

## 2023-07-01 MED ORDER — TECHNETIUM TC 99M TETROFOSMIN IV KIT
32.1000 | PACK | Freq: Once | INTRAVENOUS | Status: AC | PRN
  Administered 2023-07-01: 32.1 via INTRAVENOUS

## 2023-07-06 ENCOUNTER — Ambulatory Visit: Payer: Medicare Other | Admitting: Internal Medicine

## 2023-07-09 ENCOUNTER — Telehealth: Payer: Self-pay

## 2023-07-09 NOTE — Telephone Encounter (Signed)
-----   Message from Rollene Rotunda sent at 07/06/2023 11:40 AM EST ----- Negative perfusion study.   No further work up.  This is preop shoulder surgery.  Given the result the patient will be at acceptable cardiovascular risk for the planned surgery without further cardiovascular testing.  Please let her know the result and that she can contact her surgeon and discuss timing of surgery.  Call Ms. Lyter with the results and send results to Merri Brunette, MD

## 2023-07-09 NOTE — Telephone Encounter (Signed)
Called and spoke with patient. She states she had not seen surgeon yet and will need to make an appointment for that. Informed her that the surgeon can contact our office if they need any additional forms completed. I sent a results letter with Dr Hochrein's comments in the content that the surgeon can read as well.

## 2023-07-11 ENCOUNTER — Encounter (HOSPITAL_BASED_OUTPATIENT_CLINIC_OR_DEPARTMENT_OTHER): Payer: Self-pay | Admitting: Emergency Medicine

## 2023-07-11 ENCOUNTER — Emergency Department (HOSPITAL_BASED_OUTPATIENT_CLINIC_OR_DEPARTMENT_OTHER)
Admission: EM | Admit: 2023-07-11 | Discharge: 2023-07-12 | Disposition: A | Discharge status: Home / Self Care | Payer: Medicare Other

## 2023-07-11 ENCOUNTER — Other Ambulatory Visit: Payer: Self-pay

## 2023-07-11 DIAGNOSIS — Z79899 Other long term (current) drug therapy: Secondary | ICD-10-CM | POA: Insufficient documentation

## 2023-07-11 DIAGNOSIS — R638 Other symptoms and signs concerning food and fluid intake: Secondary | ICD-10-CM | POA: Insufficient documentation

## 2023-07-11 DIAGNOSIS — R1084 Generalized abdominal pain: Secondary | ICD-10-CM | POA: Diagnosis not present

## 2023-07-11 DIAGNOSIS — D72829 Elevated white blood cell count, unspecified: Secondary | ICD-10-CM | POA: Diagnosis not present

## 2023-07-11 DIAGNOSIS — R9431 Abnormal electrocardiogram [ECG] [EKG]: Secondary | ICD-10-CM | POA: Diagnosis not present

## 2023-07-11 LAB — COMPREHENSIVE METABOLIC PANEL
ALT: 12 U/L (ref 0–44)
AST: 16 U/L (ref 15–41)
Albumin: 4.6 g/dL (ref 3.5–5.0)
Alkaline Phosphatase: 38 U/L (ref 38–126)
Anion gap: 9 (ref 5–15)
BUN: 23 mg/dL (ref 8–23)
CO2: 27 mmol/L (ref 22–32)
Calcium: 9.3 mg/dL (ref 8.9–10.3)
Chloride: 103 mmol/L (ref 98–111)
Creatinine, Ser: 0.96 mg/dL (ref 0.44–1.00)
GFR, Estimated: >60 mL/min (ref >60)
Glucose, Bld: 120 mg/dL — ABNORMAL HIGH (ref 70–99)
Potassium: 3.8 mmol/L (ref 3.5–5.1)
Sodium: 139 mmol/L (ref 135–145)
Total Bilirubin: 1.1 mg/dL (ref <1.2)
Total Protein: 7.2 g/dL (ref 6.5–8.1)

## 2023-07-11 LAB — URINALYSIS, ROUTINE W REFLEX MICROSCOPIC
Bilirubin Urine: NEGATIVE
Glucose, UA: NEGATIVE mg/dL
Hgb urine dipstick: NEGATIVE
Ketones, ur: 15 mg/dL — AB
Leukocytes,Ua: NEGATIVE
Nitrite: NEGATIVE
Specific Gravity, Urine: 1.017 (ref 1.005–1.030)
pH: 6 (ref 5.0–8.0)

## 2023-07-11 LAB — CBC
HCT: 37.7 % (ref 36.0–46.0)
Hemoglobin: 12.4 g/dL (ref 12.0–15.0)
MCH: 30 pg (ref 26.0–34.0)
MCHC: 32.9 g/dL (ref 30.0–36.0)
MCV: 91.1 fL (ref 80.0–100.0)
Platelets: 263 10*3/uL (ref 150–400)
RBC: 4.14 MIL/uL (ref 3.87–5.11)
RDW: 15.1 % (ref 11.5–15.5)
WBC: 12.5 10*3/uL — ABNORMAL HIGH (ref 4.0–10.5)
nRBC: 0 % (ref 0.0–0.2)

## 2023-07-11 LAB — LIPASE, BLOOD: Lipase: 78 U/L — ABNORMAL HIGH (ref 11–51)

## 2023-07-11 MED ORDER — IOHEXOL 9 MG/ML PO SOLN
500.0000 mL | ORAL | Status: AC
  Administered 2023-07-11: 500 mL via ORAL

## 2023-07-11 NOTE — ED Provider Notes (Cosign Needed)
[place] [facility] AT [facility] Provider Note   CSN: [id number] Arrival date & time: 07/11/23  6295     History  Chief Complaint  Patient presents with   Abdominal Pain    [name] is a 77 y.o. female.  Patient complains of abdominal pain.  Patient reports that symptoms began 2 days ago.  Patient has had a very poor appetite she reports decrease fluid and food intake. Pt has a history of diverticulitis.  Pt reports she has also had c diff in the past.  She reports this is   The history is provided by the patient. No language interpreter was used.  Abdominal Pain Pain location:  Generalized Pain quality: aching   Pain radiates to:  Does not radiate Pain severity:  Moderate Onset quality:  Gradual Progression:  Worsening Chronicity:  New Relieved by:  Nothing Worsened by:  Nothing Associated symptoms: no fever and no nausea        Home Medications Prior to Admission medications   Medication Sig Start Date End Date Taking? Authorizing Provider  albuterol (VENTOLIN HFA) 108 (90 Base) MCG/ACT inhaler Inhale 2 puffs into the lungs every 6 (six) hours as needed for wheezing or shortness of breath.    [provider]  ALPRAZolam Prudy Feeler) 0.5 MG tablet Take 0.5 mg by mouth at bedtime.    [provider]  budesonide-formoterol (SYMBICORT) 160-4.5 MCG/ACT inhaler Inhale 2 puffs into the lungs 2 (two) times daily.    [provider]  Calcium Carb-Cholecalciferol (CALCIUM+D3 PO) Take 1 capsule by mouth every morning. Calcium 1200 mg, vitamin D 25 mcg/1000 units    [provider]  carvedilol (COREG) 3.125 MG tablet Take 1 tablet (3.125 mg total) by mouth 2 (two) times daily. 06/24/23 09/22/23  Rollene Rotunda, MD  Cetirizine HCl (ZYRTEC ALLERGY) 10 MG CAPS Take 10 mg by mouth daily as needed (seasonal allergies). 12/08/16   [provider]  Cholecalciferol (VITAMIN D) 50 MCG (2000 UT) CAPS Take 2,000 Units by  mouth every morning.    [provider]  citalopram (CELEXA) 20 MG tablet Take 20 mg by mouth every morning. 12/23/17   [provider]  denosumab (PROLIA) 60 MG/ML SOSY injection Inject 60 mg into the skin every 6 (six) months.    [provider]  ibuprofen (ADVIL,MOTRIN) 200 MG tablet Take 400 mg by mouth every 6 (six) hours as needed for fever or headache (pain).    [provider]  levothyroxine (SYNTHROID, LEVOTHROID) 75 MCG tablet Take 75 mcg by mouth daily before breakfast.    [provider]  polyvinyl alcohol (LIQUIFILM TEARS) 1.4 % ophthalmic solution Place 1 drop into both eyes daily as needed for dry eyes.    [provider]  rosuvastatin (CRESTOR) 5 MG tablet Take 5 mg by mouth every other day. 02/08/21   [provider]      Allergies    Remicade [infliximab] and Amoxicillin-pot clavulanate    Review of Systems   Review of Systems  Constitutional:  Negative for fever.  Gastrointestinal:  Positive for abdominal pain. Negative for nausea.  All other systems reviewed and are negative.   Physical Exam Updated Vital Signs BP (!) 147/71   Pulse 93   Temp 97.8 F (36.6 C) (Oral)   Resp 18   Ht 5\' 3"  (1.6 m)   Wt 53.5 kg   SpO2 91%   BMI 20.89 kg/m  Physical Exam Vitals and nursing note reviewed.  Constitutional:      Appearance: She is well-developed.  HENT:     Head: Normocephalic.  Cardiovascular:     Rate and Rhythm: Normal rate and regular rhythm.  Pulmonary:     Effort: Pulmonary effort is normal.  Abdominal:     General: There is no distension.     Palpations: Abdomen is soft.     Tenderness: There is generalized abdominal tenderness.  Musculoskeletal:        General: Normal range of motion.     Cervical back: Normal range of motion.  Skin:    General: Skin is warm.  Neurological:     General: No focal deficit present.     Mental Status: She is alert and oriented to person, place, and time.   Psychiatric:        Mood and Affect: Mood normal.     ED Results / Procedures / Treatments   Labs (all labs ordered are listed, but only abnormal results are displayed) Labs Reviewed  LIPASE, BLOOD - Abnormal; Notable for the following components:      Result Value   Lipase 78 (*)    All other components within normal limits  COMPREHENSIVE METABOLIC PANEL - Abnormal; Notable for the following components:   Glucose, Bld 120 (*)    All other components within normal limits  CBC - Abnormal; Notable for the following components:   WBC 12.5 (*)    All other components within normal limits  URINALYSIS, ROUTINE W REFLEX MICROSCOPIC - Abnormal; Notable for the following components:   Ketones, ur 15 (*)    Protein, ur TRACE (*)    All other components within normal limits    EKG None  Radiology No results found.  Procedures Procedures    Medications Ordered in ED Medications - No data to display  ED Course/ Medical Decision Making/ A&P                                 Medical Decision Making Patient complains of abdominal pain for the past 2 days.  Patient reports she has a past medical history of diverticulitis.  Patient reports this is different than her typical diverticulitis pain.  Amount and/or Complexity of Data Reviewed Labs: ordered. Decision-making details documented in ED Course.    Details: Labs ordered reviewed and interpreted patient has a white blood cell count of 12.5.  Lipase is elevated at 78. Radiology: ordered and independent interpretation performed. Decision-making details documented in ED Course.    Details: CT abdomen and pelvis ordered.  Risk Prescription drug management.    Pt's care turned over to Dr. Pilar Plate with Ct pending       Final Clinical Impression(s) / ED Diagnoses Final diagnoses:  Generalized abdominal pain    Rx / DC Orders ED Discharge Orders     None         Elson Areas, [place] 07/11/23 2331

## 2023-07-11 NOTE — ED Notes (Signed)
Pt is resting at this time. Pt expresses no complaints.

## 2023-07-11 NOTE — ED Triage Notes (Signed)
Abdo pain - epigastric Nausea some vomiting Started Friday. Has not been eating of drinking much. Comes and goes

## 2023-07-12 ENCOUNTER — Emergency Department (HOSPITAL_BASED_OUTPATIENT_CLINIC_OR_DEPARTMENT_OTHER): Payer: Medicare Other

## 2023-07-12 ENCOUNTER — Other Ambulatory Visit: Payer: Self-pay

## 2023-07-12 DIAGNOSIS — K5730 Diverticulosis of large intestine without perforation or abscess without bleeding: Secondary | ICD-10-CM | POA: Diagnosis not present

## 2023-07-12 DIAGNOSIS — R1084 Generalized abdominal pain: Secondary | ICD-10-CM | POA: Diagnosis not present

## 2023-07-12 DIAGNOSIS — N281 Cyst of kidney, acquired: Secondary | ICD-10-CM | POA: Diagnosis not present

## 2023-07-12 DIAGNOSIS — R109 Unspecified abdominal pain: Secondary | ICD-10-CM | POA: Diagnosis not present

## 2023-07-12 LAB — TROPONIN I (HIGH SENSITIVITY): Troponin I (High Sensitivity): 14 ng/L (ref <18)

## 2023-07-12 MED ORDER — SUCRALFATE 1 G PO TABS: 1.0000 g | ORAL_TABLET | Freq: Four times a day (QID) | ORAL | 0 refills | Status: DC | PRN

## 2023-07-12 MED ORDER — FAMOTIDINE IN NACL 20-0.9 MG/50ML-% IV SOLN
20.0000 mg | Freq: Once | INTRAVENOUS | Status: AC
  Administered 2023-07-12: 20 mg via INTRAVENOUS
  Filled 2023-07-12: qty 50

## 2023-07-12 MED ORDER — ONDANSETRON 4 MG PO TBDP: 4.0000 mg | ORAL_TABLET | Freq: Three times a day (TID) | ORAL | 0 refills | Status: DC | PRN

## 2023-07-12 MED ORDER — IOHEXOL 300 MG/ML  SOLN
100.0000 mL | Freq: Once | INTRAMUSCULAR | Status: AC | PRN
  Administered 2023-07-12: 80 mL via INTRAVENOUS

## 2023-07-12 MED ORDER — ALUM & MAG HYDROXIDE-SIMETH 200-200-20 MG/5ML PO SUSP
30.0000 mL | Freq: Once | ORAL | Status: AC
  Administered 2023-07-12: 30 mL via ORAL
  Filled 2023-07-12: qty 30

## 2023-07-12 MED ORDER — FENTANYL CITRATE PF 50 MCG/ML IJ SOSY
50.0000 ug | PREFILLED_SYRINGE | Freq: Once | INTRAMUSCULAR | Status: AC
  Administered 2023-07-12: 50 ug via INTRAVENOUS
  Filled 2023-07-12: qty 1

## 2023-07-12 NOTE — Discharge Instructions (Signed)
You were evaluated in the Emergency Department and after careful evaluation, we did not find any emergent condition requiring admission or further testing in the hospital.  Your exam/testing today is overall reassuring.  Symptoms may be due to a gastritis or inflammation of the stomach.  Recommend Zofran as needed for nausea, Carafate as needed for discomfort.  Recommend follow-up with your primary care doctor.  Please return to the Emergency Department if you experience any worsening of your condition.   Thank you for allowing [place] to be a part of your care.

## 2023-07-12 NOTE — ED Provider Notes (Signed)
  Provider Note MRN:  [id number]  Arrival date & time: 07/12/23    ED Course and Medical Decision Making  Assumed care from [facility] at shift change.  Suspicion for recurrent diverticulitis awaiting CT.  3:45 AM update: CT is overall reassuring, no signs of diverticulitis.  On my reassessment patient was complaining of continued epigastric pain.  Largely nontender exam.  Lipase was elevated but CT scan showed a normal pancreas.  Possibly pain related to a gastritis.  After reassessment with symptomatic management, patient is feeling much better, would like to go home and sleep.  Return precautions provided, appropriate for discharge.  Procedures  Final Clinical Impressions(s) / ED Diagnoses     ICD-10-CM   1. Generalized abdominal pain  R10.84       ED Discharge Orders          Ordered    ondansetron (ZOFRAN-ODT) 4 MG disintegrating tablet  Every 8 hours PRN        07/12/23 0351    sucralfate (CARAFATE) 1 g tablet  4 times daily PRN        07/12/23 0351              Discharge Instructions      You were evaluated in the Emergency Department and after careful evaluation, we did not find any emergent condition requiring admission or further testing in the hospital.  Your exam/testing today is overall reassuring.  Symptoms may be due to a gastritis or inflammation of the stomach.  Recommend Zofran as needed for nausea, Carafate as needed for discomfort.  Recommend follow-up with your primary care doctor.  Please return to the Emergency Department if you experience any worsening of your condition.   Thank you for allowing [place] to be a part of your care.      Elmer Sow. Pilar Plate, MD [facility] Emergency Medicine [facility] [email or web address]    Sabas Sous, MD 07/12/23 [phone]

## 2023-07-13 DIAGNOSIS — R1013 Epigastric pain: Secondary | ICD-10-CM | POA: Diagnosis not present

## 2023-07-13 DIAGNOSIS — L239 Allergic contact dermatitis, unspecified cause: Secondary | ICD-10-CM | POA: Diagnosis not present

## 2023-07-29 DIAGNOSIS — M0589 Other rheumatoid arthritis with rheumatoid factor of multiple sites: Secondary | ICD-10-CM | POA: Diagnosis not present

## 2023-08-02 ENCOUNTER — Ambulatory Visit (HOSPITAL_COMMUNITY): Payer: Medicare Other | Attending: Cardiology

## 2023-08-02 DIAGNOSIS — Z01810 Encounter for preprocedural cardiovascular examination: Secondary | ICD-10-CM | POA: Insufficient documentation

## 2023-08-02 LAB — ECHOCARDIOGRAM COMPLETE
Area-P 1/2: 3.74 cm2
S' Lateral: 2.6 cm

## 2023-08-05 ENCOUNTER — Ambulatory Visit: Payer: Medicare Other | Admitting: Student

## 2023-08-05 ENCOUNTER — Encounter: Payer: Self-pay | Admitting: *Deleted

## 2023-08-07 NOTE — Progress Notes (Signed)
Cardiology Office Note:    Date:  08/16/2023   ID:  SUEELLEN KAYES, DOB December 20, 1945, MRN [id number]  PCP:  Merri Brunette, MD  Cardiologist:  Rollene Rotunda, MD     Referring MD: Merri Brunette, MD   Chief Complaint: follow-up of Echo and Myoview  History of Present Illness:    [name] is a 78 y.o. 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 Antoine Poche and presents today for follow-up of Echo and Myoview.   Patient was remotely seen by Dr. Antoine Poche for management of CHF. She was diagnosed with pneumonia in 2005 and states this triggered "heart failure" at that time. EF was reportedly 45% at that time. She was seen by Dr. Antoine Poche but did not want to take a" bunch of heart pills." She was lost to follow-up and was not seen again until 2022 when she was referred to Dr. Shari Prows for further evaluation of an abnormal EKG. Her PCP had recently ordered a coronary calcium score and this came back at 93.9 (67th percentile for age and sex). Dr. Shari Prows ordered and Echo and Myoview but patient never had these done.  She was not see again until 06/24/2023 when she was seen by Dr. Antoine Poche for pre-op evaluation for upcoming shoulder surgery. She denied any chest pain or shortness of breath at that time. Echo and Myoview were re-ordered to help with risk stratification prior to surgery. Myoview was low risk with no evidence of ischemia. Echo showed LVEF of 50-55% with global hypokinesis and grade 1 diastolic dysfunction, normal RV size and function, and no significant valvular disease.  Patient presents today for follow-up. She denies any chest pain. She reports some shortness of breath after walking her dog who is a "puller" as well as when vacuuming. However, she denies  any dyspnea when doing other routine activities around the house or when walking up a flight of steps. No orthopnea, PND, or edema. No palpitations, lightheadedness, dizziness, syncope.   EKGs/Labs/Other Studies Reviewed:    The following studies were reviewed:  CT Cardiac Scoring 08/30/2020: Impressions: 1. Coronary calcium score is 93.9 and this is at percentile 67 for patients of the same age, gender and ethnicity. 2. Scattered areas of bronchiectasis with subtle patchy densities in the visualized lungs. Findings are most compatible with post inflammatory changes. Several tiny nodular densities are indeterminate but could also be post inflammatory. Largest pulmonary nodule measures up to 4 mm. No follow-up needed if patient is low-risk (and has no known or suspected primary neoplasm). Non-contrast chest CT can be considered in 12 months if patient is high-risk _______________  Myoview 07/01/2023:   The study is normal. The study is low risk.   No ST deviation was noted.   LV perfusion is normal. There is no evidence of ischemia. There is no evidence of infarction.  (There is mild decrease uptake along the anteroseptal region from mid to apex on supine stress views but this is not seen on upright stress views.  Reassurance, likely artifact.)   Left ventricular function is normal. Nuclear stress EF: 58%. The left ventricular ejection fraction is normal (55-65%). End diastolic cavity size is normal. End systolic cavity size is normal. _______________  Echocardiogram 08/02/2023: Impressions: 1. Left ventricular ejection fraction, by estimation, is 50 to  55%. The  left ventricle has low normal function. The left ventricle demonstrates  mild global hypokinesis. Left ventricular diastolic parameters are  consistent with Grade I diastolic  dysfunction (impaired relaxation). The average left ventricular global  longitudinal strain is -16.5 %. The global longitudinal strain is  abnormal.    2. Right ventricular systolic function is normal. The right ventricular  size is normal.   3. Left atrial size was mild to moderately dilated.   4. The mitral valve is normal in structure. No evidence of mitral valve  regurgitation. No evidence of mitral stenosis.   5. The aortic valve is normal in structure. There is mild thickening of  the aortic valve. Aortic valve regurgitation is not visualized. No aortic  stenosis is present.   6. The inferior vena cava is normal in size with greater than 50%  respiratory variability, suggesting right atrial pressure of 3 mmHg.    EKG:  EKG not ordered today.   Recent Labs: 07/11/2023: ALT 12; BUN 23; Creatinine, Ser 0.96; Hemoglobin 12.4; Platelets 263; Potassium 3.8; Sodium 139  Recent Lipid Panel    Component Value Date/Time   CHOL  10/24/2010 1805    184        ATP III CLASSIFICATION:  <200     mg/dL   Desirable  409-811  mg/dL   Borderline High  >=914    mg/dL   High          TRIG 63 10/24/2010 1805   HDL 63 10/24/2010 1805   CHOLHDL 2.9 10/24/2010 1805   VLDL 13 10/24/2010 1805   LDLCALC (H) 10/24/2010 1805    108        Total Cholesterol/HDL:CHD Risk Coronary Heart Disease Risk Table                     Men   Women  1/2 Average Risk   3.4   3.3  Average Risk       5.0   4.4  2 X Average Risk   9.6   7.1  3 X Average Risk  23.4   11.0        Use the calculated Patient Ratio above and the CHD Risk Table to determine the patient's CHD Risk.        ATP III CLASSIFICATION (LDL):  <100     mg/dL   Optimal  782-956  mg/dL   Near or Above                    Optimal  130-159  mg/dL   Borderline  213-086  mg/dL   High  >578     mg/dL   Very High    Physical Exam:    Vital Signs: BP 132/86   Pulse 90   Ht 5\' 2"  (1.575 m)   Wt 117 lb 12.8 oz (53.4 kg)   SpO2 96%   BMI 21.55 kg/m     Wt Readings from Last 3 Encounters:  08/16/23 117 lb 12.8 oz (53.4 kg)  07/11/23 117 lb 15.1 oz (53.5 kg)  07/01/23 121 lb (54.9 kg)      General: 78 y.o. thin Caucasian female in no acute distress. HEENT: Normocephalic and atraumatic. Sclera clear.  Neck: Supple. No carotid bruits. No JVD. Heart: RRR. Distinct S1 and S2. No murmurs, gallops, or rubs.  Lungs: No increased work of breathing. Clear to ausculation bilaterally. No wheezes, rhonchi, or rales.  Extremities: No lower extremity edema.  Skin: Warm and dry. Neuro: No focal deficits. Psych: Normal affect. Responds appropriately.  Assessment:    1. Pre-op evaluation   2. Coronary artery calcification   3. Chronic HFmrEF   4. Borderline hypertension     Plan:    Pre-Op Evaluation Patient is in need of shoulder surgery. Recent Myoview and Echo were unremarkable. Myoview was low risk with no evidence of ischemia. Echo showed LVEF of 50-55% with global hypokinesis and grade 1 diastolic dysfunction and no significant valvular disease. Therefore, OK to proceed with surgery. Recommend continuing beta-blocker (Coreg) perioperatively.   Coronary Artery Calcifications  Coronary calcium score in 08/2020 was 93.9 (67th percentile for age and sex). Myoview in 06/2023 was low risk with no evidence of ischemia. - No chest pain. - Would recommend starting Aspirin 81mg  daily. She can do this after her shoulder surgery. - Continue statin.  Chronic HFmrEF Initially diagnosed around 2005 when she was diagnosed with pneumonia. EF was reportedly 45% at that time. Recent Echo in 07/2023 showed LVEF of 50-55% with global hypokinesis and grade 1 diastolic dysfunction, normal RV size and function, and no significant valvular disease. - Euvolemic on exam. hest  - Continue Coreg 3.125mg  twice daily.   Borderline Hypertension BP is borderline elevated. Initially 142/70 and then 132/86 on my personal recheck at the end of the visit. She states BP is usually in the 120-130s/80s at home. - Continue Coreg 3.125mg  twice daily.  - Asked patient to keep a BP/HR log for 2 weeks and then send  this to [place].   Disposition: Follow up in 1 year.    Signed, Corrin Parker, PA-C  08/16/2023 9:17 PM    [facility]

## 2023-08-16 ENCOUNTER — Ambulatory Visit: Payer: Medicare Other | Attending: Student | Admitting: Student

## 2023-08-16 ENCOUNTER — Encounter: Payer: Self-pay | Admitting: Student

## 2023-08-16 VITALS — BP 132/86 | HR 90 | Ht 62.0 in | Wt 117.8 lb

## 2023-08-16 DIAGNOSIS — Z01818 Encounter for other preprocedural examination: Secondary | ICD-10-CM | POA: Insufficient documentation

## 2023-08-16 DIAGNOSIS — I2510 Atherosclerotic heart disease of native coronary artery without angina pectoris: Secondary | ICD-10-CM | POA: Diagnosis not present

## 2023-08-16 DIAGNOSIS — R030 Elevated blood-pressure reading, without diagnosis of hypertension: Secondary | ICD-10-CM | POA: Diagnosis not present

## 2023-08-16 DIAGNOSIS — I5022 Chronic systolic (congestive) heart failure: Secondary | ICD-10-CM | POA: Insufficient documentation

## 2023-08-16 NOTE — Patient Instructions (Addendum)
Medication Instructions:  START ASPIRIN 81MG -AFTER SURGERY *If you need a refill on your cardiac medications before your next appointment, please call your pharmacy*  Lab Work: NONE  Testing/Procedures: NONE  Follow-Up: At [facility], you and your health needs are our priority.  As part of our continuing mission to provide you with exceptional heart care, we have created designated Provider Care Teams.  These Care Teams include your primary Cardiologist (physician) and Advanced Practice Providers (APPs -  Physician Assistants and Nurse Practitioners) who all work together to provide you with the care you need, when you need it.  Your next appointment:   12 month(s)  Provider:   Rollene Rotunda, MD     Other Instructions TAKE AND LOG YOUR BLOOD PRESSURE AND HEART RATE-MYCHART TO CALLIE TO REVIEW.  CLEARED FOR YOUR SHOULDER SURGERY

## 2023-09-10 ENCOUNTER — Other Ambulatory Visit: Payer: Self-pay

## 2023-09-10 ENCOUNTER — Encounter (HOSPITAL_BASED_OUTPATIENT_CLINIC_OR_DEPARTMENT_OTHER): Payer: Self-pay | Admitting: *Deleted

## 2023-09-10 ENCOUNTER — Emergency Department (HOSPITAL_BASED_OUTPATIENT_CLINIC_OR_DEPARTMENT_OTHER): Payer: Medicare Other

## 2023-09-10 ENCOUNTER — Emergency Department (HOSPITAL_BASED_OUTPATIENT_CLINIC_OR_DEPARTMENT_OTHER)
Admission: EM | Admit: 2023-09-10 | Discharge: 2023-09-10 | Disposition: A | Discharge status: Home / Self Care | Payer: Medicare Other | Attending: Emergency Medicine | Admitting: Emergency Medicine

## 2023-09-10 DIAGNOSIS — N858 Other specified noninflammatory disorders of uterus: Secondary | ICD-10-CM | POA: Diagnosis not present

## 2023-09-10 DIAGNOSIS — K5732 Diverticulitis of large intestine without perforation or abscess without bleeding: Secondary | ICD-10-CM | POA: Diagnosis not present

## 2023-09-10 DIAGNOSIS — R1011 Right upper quadrant pain: Secondary | ICD-10-CM | POA: Diagnosis present

## 2023-09-10 DIAGNOSIS — N281 Cyst of kidney, acquired: Secondary | ICD-10-CM | POA: Diagnosis not present

## 2023-09-10 DIAGNOSIS — K5792 Diverticulitis of intestine, part unspecified, without perforation or abscess without bleeding: Secondary | ICD-10-CM

## 2023-09-10 LAB — URINALYSIS, ROUTINE W REFLEX MICROSCOPIC
Bilirubin Urine: NEGATIVE
Glucose, UA: NEGATIVE mg/dL
Hgb urine dipstick: NEGATIVE
Ketones, ur: NEGATIVE mg/dL
Leukocytes,Ua: NEGATIVE
Nitrite: NEGATIVE
Protein, ur: NEGATIVE mg/dL
Specific Gravity, Urine: 1.005 (ref 1.005–1.030)
pH: 6.5 (ref 5.0–8.0)

## 2023-09-10 LAB — CBC
HCT: 36.3 % (ref 36.0–46.0)
Hemoglobin: 11.6 g/dL — ABNORMAL LOW (ref 12.0–15.0)
MCH: 29.7 pg (ref 26.0–34.0)
MCHC: 32 g/dL (ref 30.0–36.0)
MCV: 92.8 fL (ref 80.0–100.0)
Platelets: 214 10*3/uL (ref 150–400)
RBC: 3.91 MIL/uL (ref 3.87–5.11)
RDW: 14.9 % (ref 11.5–15.5)
WBC: 14.7 10*3/uL — ABNORMAL HIGH (ref 4.0–10.5)
nRBC: 0 % (ref 0.0–0.2)

## 2023-09-10 LAB — COMPREHENSIVE METABOLIC PANEL
ALT: 10 U/L (ref 0–44)
AST: 12 U/L — ABNORMAL LOW (ref 15–41)
Albumin: 4.1 g/dL (ref 3.5–5.0)
Alkaline Phosphatase: 37 U/L — ABNORMAL LOW (ref 38–126)
Anion gap: 6 (ref 5–15)
BUN: 17 mg/dL (ref 8–23)
CO2: 28 mmol/L (ref 22–32)
Calcium: 8.8 mg/dL — ABNORMAL LOW (ref 8.9–10.3)
Chloride: 105 mmol/L (ref 98–111)
Creatinine, Ser: 0.76 mg/dL (ref 0.44–1.00)
GFR, Estimated: >60 mL/min (ref >60)
Glucose, Bld: 97 mg/dL (ref 70–99)
Potassium: 3.8 mmol/L (ref 3.5–5.1)
Sodium: 139 mmol/L (ref 135–145)
Total Bilirubin: 1 mg/dL (ref 0.0–1.2)
Total Protein: 6.3 g/dL — ABNORMAL LOW (ref 6.5–8.1)

## 2023-09-10 LAB — LIPASE, BLOOD: Lipase: 43 U/L (ref 11–51)

## 2023-09-10 MED ORDER — HYDROCODONE-ACETAMINOPHEN 5-325 MG PO TABS: 1.0000 | ORAL_TABLET | ORAL | 0 refills | Status: DC | PRN

## 2023-09-10 MED ORDER — METRONIDAZOLE 500 MG PO TABS: 500.0000 mg | ORAL_TABLET | Freq: Two times a day (BID) | ORAL | 0 refills | Status: DC

## 2023-09-10 MED ORDER — FENTANYL CITRATE PF 50 MCG/ML IJ SOSY
50.0000 ug | PREFILLED_SYRINGE | Freq: Once | INTRAMUSCULAR | Status: DC
  Filled 2023-09-10: qty 1

## 2023-09-10 MED ORDER — ONDANSETRON HCL 4 MG/2ML IJ SOLN
4.0000 mg | Freq: Once | INTRAMUSCULAR | Status: DC
  Filled 2023-09-10: qty 2

## 2023-09-10 MED ORDER — SODIUM CHLORIDE 0.9 % IV BOLUS: 500.0000 mL | Freq: Once | INTRAVENOUS | Status: DC

## 2023-09-10 MED ORDER — CIPROFLOXACIN HCL 500 MG PO TABS: 500.0000 mg | ORAL_TABLET | Freq: Two times a day (BID) | ORAL | 0 refills | Status: DC

## 2023-09-10 MED ORDER — IOHEXOL 300 MG/ML  SOLN
100.0000 mL | Freq: Once | INTRAMUSCULAR | Status: AC | PRN
Start: 2023-09-10 — End: 2023-09-10
  Administered 2023-09-10: 100 mL via INTRAVENOUS

## 2023-09-10 NOTE — ED Notes (Addendum)
 Pt requested the IV be removed, IV removed... Pt requested to leave... Pt understood that it would be AMA... Provider informed and was going to talk to the Pt... Pt requested BP cuff and pulse ox be removed, all removed... Pt started to get dressed... Pt decided to stay long enough for the Provider give further results.Marland KitchenMarland Kitchen

## 2023-09-10 NOTE — ED Triage Notes (Signed)
 Pt is having right lower abdominal pain which began 2 days ago.  No urinary symptoms with this.  Pt has had nausea with this.  LBM today (small and soft).  No fever or chills with this.

## 2023-09-10 NOTE — ED Notes (Signed)
 Provider informed of the PT request meds for pain.

## 2023-09-10 NOTE — ED Provider Notes (Signed)
 [place] EMERGENCY DEPARTMENT AT [facility] Provider Note   CSN: [id number] Arrival date & time: 09/10/23  0847     History  Chief Complaint  Patient presents with   Abdominal Pain    [name] is a 78 y.o. female.  Patient is a 78 year old female with a history of diverticulitis who presents with abdominal pain.  She has a 3-day history of worsening pain in her right upper abdomen.  She has had some nausea but no vomiting.  No known fevers.  No urinary symptoms.  She has had some small soft bowel movements.  No diarrhea.  No prior abdominal surgeries.       Home Medications Prior to Admission medications   Medication Sig Start Date End Date Taking? Authorizing Provider  ciprofloxacin (CIPRO) 500 MG tablet Take 1 tablet (500 mg total) by mouth 2 (two) times daily. 09/10/23  Yes Rolan Bucco, MD  HYDROcodone-acetaminophen (NORCO/VICODIN) 5-325 MG tablet Take 1 tablet by mouth every 4 (four) hours as needed. 09/10/23  Yes Rolan Bucco, MD  metroNIDAZOLE (FLAGYL) 500 MG tablet Take 1 tablet (500 mg total) by mouth 2 (two) times daily. 09/10/23  Yes Rolan Bucco, MD  albuterol (VENTOLIN HFA) 108 (90 Base) MCG/ACT inhaler Inhale 2 puffs into the lungs every 6 (six) hours as needed for wheezing or shortness of breath.    [provider]  ALPRAZolam Prudy Feeler) 0.5 MG tablet Take 0.5 mg by mouth at bedtime.    [provider]  budesonide-formoterol (SYMBICORT) 160-4.5 MCG/ACT inhaler Inhale 2 puffs into the lungs 2 (two) times daily.    [provider]  Calcium Carb-Cholecalciferol (CALCIUM+D3 PO) Take 1 capsule by mouth every morning. Calcium 1200 mg, vitamin D 25 mcg/1000 units    [provider]  carvedilol (COREG) 3.125 MG tablet Take 1 tablet (3.125 mg total) by mouth 2 (two) times daily. 06/24/23 09/22/23  Rollene Rotunda, MD  Cetirizine HCl (ZYRTEC ALLERGY) 10 MG CAPS Take 10 mg by mouth daily as needed (seasonal allergies). 12/08/16    [provider]  Cholecalciferol (VITAMIN D) 50 MCG (2000 UT) CAPS Take 2,000 Units by mouth every morning.    [provider]  citalopram (CELEXA) 20 MG tablet Take 10 mg by mouth every morning. 12/23/17   [provider]  denosumab (PROLIA) 60 MG/ML SOSY injection Inject 60 mg into the skin every 6 (six) months.    [provider]  ibuprofen (ADVIL,MOTRIN) 200 MG tablet Take 400 mg by mouth every 6 (six) hours as needed for fever or headache (pain).    [provider]  levothyroxine (SYNTHROID, LEVOTHROID) 75 MCG tablet Take 75 mcg by mouth daily before breakfast.    [provider]  ondansetron (ZOFRAN-ODT) 4 MG disintegrating tablet Take 1 tablet (4 mg total) by mouth every 8 (eight) hours as needed for nausea or vomiting. 07/12/23   Sabas Sous, MD  polyvinyl alcohol (LIQUIFILM TEARS) 1.4 % ophthalmic solution Place 1 drop into both eyes daily as needed for dry eyes.    [provider]  rosuvastatin (CRESTOR) 5 MG tablet Take 5 mg by mouth every other day. 02/08/21   [provider]      Allergies    Remicade [infliximab], Amoxicillin-pot clavulanate, and Sucralfate    Review of Systems   Review of Systems  Constitutional:  Negative for chills, diaphoresis, fatigue and fever.  HENT:  Negative for congestion, rhinorrhea and sneezing.   Eyes: Negative.   Respiratory:  Negative  for cough, chest tightness and shortness of breath.   Cardiovascular:  Negative for chest pain and leg swelling.  Gastrointestinal:  Positive for abdominal pain and nausea. Negative for blood in stool, diarrhea and vomiting.  Genitourinary:  Negative for difficulty urinating, flank pain, frequency and hematuria.  Musculoskeletal:  Negative for arthralgias and back pain.  Skin:  Negative for rash.  Neurological:  Negative for dizziness, speech difficulty, weakness, numbness and headaches.    Physical Exam Updated Vital Signs BP (!)  117/90   Pulse 97   Temp 97.8 F (36.6 C)   Resp 16   SpO2 100%  Physical Exam Constitutional:      Appearance: She is well-developed.  HENT:     Head: Normocephalic and atraumatic.  Eyes:     Pupils: Pupils are equal, round, and reactive to light.  Cardiovascular:     Rate and Rhythm: Normal rate and regular rhythm.     Heart sounds: Normal heart sounds.  Pulmonary:     Effort: Pulmonary effort is normal. No respiratory distress.     Breath sounds: Normal breath sounds. No wheezing or rales.  Chest:     Chest wall: No tenderness.  Abdominal:     General: Bowel sounds are normal.     Palpations: Abdomen is soft.     Tenderness: There is abdominal tenderness in the right upper quadrant and epigastric area. There is no guarding or rebound.  Musculoskeletal:        General: Normal range of motion.     Cervical back: Normal range of motion and neck supple.  Lymphadenopathy:     Cervical: No cervical adenopathy.  Skin:    General: Skin is warm and dry.     Findings: No rash.  Neurological:     Mental Status: She is alert and oriented to person, place, and time.     ED Results / Procedures / Treatments   Labs (all labs ordered are listed, but only abnormal results are displayed) Labs Reviewed  COMPREHENSIVE METABOLIC PANEL - Abnormal; Notable for the following components:      Result Value   Calcium 8.8 (*)    Total Protein 6.3 (*)    AST 12 (*)    Alkaline Phosphatase 37 (*)    All other components within normal limits  CBC - Abnormal; Notable for the following components:   WBC 14.7 (*)    Hemoglobin 11.6 (*)    All other components within normal limits  URINALYSIS, ROUTINE W REFLEX MICROSCOPIC - Abnormal; Notable for the following components:   Color, Urine COLORLESS (*)    All other components within normal limits  LIPASE, BLOOD    EKG None  Radiology CT ABDOMEN PELVIS W CONTRAST Result Date: 09/10/2023 CLINICAL DATA:  Abdominal pain.  Acute.   Nonlocalized. EXAM: CT ABDOMEN AND PELVIS WITH CONTRAST TECHNIQUE: Multidetector CT imaging of the abdomen and pelvis was performed using the standard protocol following bolus administration of intravenous contrast. RADIATION DOSE REDUCTION: This exam was performed according to the departmental dose-optimization program which includes automated exposure control, adjustment of the mA and/or kV according to patient size and/or use of iterative reconstruction technique. CONTRAST:  OMNIPAQUE IOHEXOL 300 MG/ML  SOLN COMPARISON:  CT scan abdomen and pelvis from 07/12/2023. FINDINGS: Lower chest: There are peripheral/subpleural reticulations in the visualized bilateral lungs along with patchy areas of paraseptal emphysematous changes. There are small groupings of tree-in-bud configuration nodules in the right lung lower lobe, nonspecific but likely sequela  of infection or inflammation. The lung bases are otherwise clear. No dense consolidation. No pleural effusion. The heart is normal in size. No pericardial effusion. Hepatobiliary: The liver is normal in size. Non-cirrhotic configuration. No suspicious mass. There are 2, sub 5 mm hypoattenuating foci in the liver, which are too small to adequately characterize. No intrahepatic or extrahepatic bile duct dilation. No calcified gallstones. Normal gallbladder wall thickness. No pericholecystic inflammatory changes. Pancreas: Unremarkable. No pancreatic ductal dilatation or surrounding inflammatory changes. Spleen: Spleen is enlarged measuring upto 5.7 x 14.2 cm orthogonally on coronal plane. No focal lesion. Adrenals/Urinary Tract: Adrenal glands are unremarkable. No suspicious renal mass. Redemonstration of innumerable cysts throughout bilateral kidneys ranging in size from few mm up to cm. No hydroureteronephrosis or nephroureterolithiasis. There are renal sinus cysts in the left kidney. Unremarkable urinary bladder. Stomach/Bowel: No disproportionate dilation of  small or large bowel loops. Unremarkable appendix. There is an approximately 5-6 cm long segment of distal sigmoid colon exhibiting moderate circumferential thickening and mild pericolonic fat stranding on the background of several diverticula, favoring acute uncomplicated diverticulitis. Vascular/Lymphatic: No ascites or pneumoperitoneum. No abdominal or pelvic lymphadenopathy, by size criteria. No aneurysmal dilation of the major abdominal arteries. There are mild peripheral atherosclerotic vascular calcifications of the aorta and its major branches. Reproductive: Normal-size anteverted uterus. There are myometrial calcifications. There is a well-circumscribed approximately 2.8 x 4.2 cm hypoattenuating structure in the right adnexa which is favored ovarian in etiology. This is favored to represent senescent ovarian cyst. However, better characterization with nonemergent pelvic ultrasound is recommended. No left adnexal mass. Other: The visualized soft tissues and abdominal wall are unremarkable. Musculoskeletal: No suspicious osseous lesions. There are mild multilevel degenerative changes in the visualized spine. IMPRESSION: 1. Findings favor acute uncomplicated sigmoid diverticulitis. No pericolonic abscess or pneumoperitoneum. 2. There is a well-circumscribed approximately 2.8 x 4.2 cm hypoattenuating structure in the right adnexa which is favored ovarian in etiology. This is favored to represent senescent ovarian cyst. However, better characterization with nonemergent pelvic ultrasound is recommended. 3. There are small groupings of tree-in-bud configuration nodules in the right lung lower lobe, nonspecific but likely sequela of infection or inflammation. 4. Multiple other nonacute observations, as described above. Aortic Atherosclerosis (ICD10-I70.0) and Emphysema (ICD10-J43.9). Electronically Signed   By: Jules Schick M.D.   On: 09/10/2023 13:50    Procedures Procedures    Medications Ordered in  ED Medications  fentaNYL (SUBLIMAZE) injection 50 mcg (50 mcg Intravenous Not Given 09/10/23 1331)  ondansetron (ZOFRAN) injection 4 mg (4 mg Intravenous Patient Refused/Not Given 09/10/23 1330)  sodium chloride 0.9 % bolus 500 mL (500 mLs Intravenous Patient Refused/Not Given 09/10/23 1331)  iohexol (OMNIPAQUE) 300 MG/ML solution 100 mL (100 mLs Intravenous Contrast Given 09/10/23 1216)    ED Course/ Medical Decision Making/ A&P                                 Medical Decision Making Amount and/or Complexity of Data Reviewed Labs: ordered. Radiology: ordered.  Risk Prescription drug management.   Patient is a 79 year old female who presents with abdominal pain.  Labs show an elevated WBC count.  Her urine is not concerning for infection.  Other labs are nonconcerning.  CT scan shows evidence of diverticulitis.  No evidence of abscess.  No evidence of perforation.  She otherwise is well-appearing.  She was discharged home in good condition.  She has an allergy to Augmentin.  Was started on Cipro and Flagyl.  Was advised on clear liquid diet.  Was advised that she had a lesion on her adnexa which needs outpatient follow-up with her PCP through an ultrasound.  Was advised to make an appointment to follow-up with her PCP.  Return precautions were given.  Final Clinical Impression(s) / ED Diagnoses Final diagnoses:  Diverticulitis    Rx / DC Orders ED Discharge Orders          Ordered    ciprofloxacin (CIPRO) 500 MG tablet  2 times daily        09/10/23 1414    metroNIDAZOLE (FLAGYL) 500 MG tablet  2 times daily        09/10/23 1414    HYDROcodone-acetaminophen (NORCO/VICODIN) 5-325 MG tablet  Every 4 hours PRN        09/10/23 1414              Rolan Bucco, MD 09/10/23 1418

## 2023-09-10 NOTE — Discharge Instructions (Addendum)
 You have a cystic structure near your ovary.  The radiologist is recommending that you have a follow-up ultrasound that can be ordered by your primary care doctor.  Please make sure that they know about this.  Take the antibiotics as directed.  Eat a clear liquid diet for the next few days.  Return to the emergency room if you have any worsening symptoms.

## 2023-09-10 NOTE — ED Notes (Addendum)
 Discharge paperwork given and verbally understood... Pt refused further V/S.Marland KitchenMarland Kitchen

## 2023-09-14 DIAGNOSIS — M4125 Other idiopathic scoliosis, thoracolumbar region: Secondary | ICD-10-CM | POA: Diagnosis not present

## 2023-09-14 DIAGNOSIS — M0589 Other rheumatoid arthritis with rheumatoid factor of multiple sites: Secondary | ICD-10-CM | POA: Diagnosis not present

## 2023-09-14 DIAGNOSIS — M150 Primary generalized (osteo)arthritis: Secondary | ICD-10-CM | POA: Diagnosis not present

## 2023-09-14 DIAGNOSIS — N182 Chronic kidney disease, stage 2 (mild): Secondary | ICD-10-CM | POA: Diagnosis not present

## 2023-09-14 DIAGNOSIS — M810 Age-related osteoporosis without current pathological fracture: Secondary | ICD-10-CM | POA: Diagnosis not present

## 2023-09-14 DIAGNOSIS — M3500 Sicca syndrome, unspecified: Secondary | ICD-10-CM | POA: Diagnosis not present

## 2023-09-14 DIAGNOSIS — Z79899 Other long term (current) drug therapy: Secondary | ICD-10-CM | POA: Diagnosis not present

## 2023-09-16 ENCOUNTER — Emergency Department (HOSPITAL_COMMUNITY)

## 2023-09-16 ENCOUNTER — Encounter (HOSPITAL_COMMUNITY): Payer: Self-pay | Admitting: Emergency Medicine

## 2023-09-16 ENCOUNTER — Other Ambulatory Visit: Payer: Self-pay

## 2023-09-16 ENCOUNTER — Emergency Department (HOSPITAL_COMMUNITY)
Admission: EM | Admit: 2023-09-16 | Discharge: 2023-09-16 | Disposition: A | Discharge status: Home / Self Care | Source: Home / Self Care | Attending: Emergency Medicine | Admitting: Emergency Medicine

## 2023-09-16 ENCOUNTER — Emergency Department (HOSPITAL_COMMUNITY)
Admission: EM | Admit: 2023-09-16 | Discharge: 2023-09-16 | Disposition: A | Discharge status: Home / Self Care | Attending: Emergency Medicine | Admitting: Emergency Medicine

## 2023-09-16 ENCOUNTER — Encounter (HOSPITAL_COMMUNITY): Payer: Self-pay

## 2023-09-16 DIAGNOSIS — R112 Nausea with vomiting, unspecified: Secondary | ICD-10-CM

## 2023-09-16 DIAGNOSIS — R1110 Vomiting, unspecified: Secondary | ICD-10-CM | POA: Insufficient documentation

## 2023-09-16 DIAGNOSIS — Z87891 Personal history of nicotine dependence: Secondary | ICD-10-CM | POA: Insufficient documentation

## 2023-09-16 DIAGNOSIS — Z20822 Contact with and (suspected) exposure to covid-19: Secondary | ICD-10-CM | POA: Insufficient documentation

## 2023-09-16 DIAGNOSIS — J45909 Unspecified asthma, uncomplicated: Secondary | ICD-10-CM | POA: Diagnosis not present

## 2023-09-16 DIAGNOSIS — Z79899 Other long term (current) drug therapy: Secondary | ICD-10-CM | POA: Diagnosis not present

## 2023-09-16 DIAGNOSIS — M79603 Pain in arm, unspecified: Secondary | ICD-10-CM | POA: Diagnosis not present

## 2023-09-16 DIAGNOSIS — Z1152 Encounter for screening for COVID-19: Secondary | ICD-10-CM | POA: Insufficient documentation

## 2023-09-16 DIAGNOSIS — I10 Essential (primary) hypertension: Secondary | ICD-10-CM | POA: Diagnosis not present

## 2023-09-16 DIAGNOSIS — Z7951 Long term (current) use of inhaled steroids: Secondary | ICD-10-CM | POA: Diagnosis not present

## 2023-09-16 DIAGNOSIS — K5792 Diverticulitis of intestine, part unspecified, without perforation or abscess without bleeding: Secondary | ICD-10-CM | POA: Insufficient documentation

## 2023-09-16 DIAGNOSIS — K5732 Diverticulitis of large intestine without perforation or abscess without bleeding: Secondary | ICD-10-CM | POA: Diagnosis not present

## 2023-09-16 DIAGNOSIS — E039 Hypothyroidism, unspecified: Secondary | ICD-10-CM | POA: Diagnosis not present

## 2023-09-16 DIAGNOSIS — R1013 Epigastric pain: Secondary | ICD-10-CM | POA: Diagnosis not present

## 2023-09-16 DIAGNOSIS — K5900 Constipation, unspecified: Secondary | ICD-10-CM | POA: Diagnosis not present

## 2023-09-16 DIAGNOSIS — R202 Paresthesia of skin: Secondary | ICD-10-CM | POA: Diagnosis not present

## 2023-09-16 DIAGNOSIS — R42 Dizziness and giddiness: Secondary | ICD-10-CM | POA: Diagnosis not present

## 2023-09-16 LAB — COMPREHENSIVE METABOLIC PANEL
ALT: 13 U/L (ref 0–44)
AST: 18 U/L (ref 15–41)
Albumin: 3.4 g/dL — ABNORMAL LOW (ref 3.5–5.0)
Alkaline Phosphatase: 30 U/L — ABNORMAL LOW (ref 38–126)
Anion gap: 9 (ref 5–15)
BUN: 15 mg/dL (ref 8–23)
CO2: 26 mmol/L (ref 22–32)
Calcium: 9.2 mg/dL (ref 8.9–10.3)
Chloride: 104 mmol/L (ref 98–111)
Creatinine, Ser: 0.72 mg/dL (ref 0.44–1.00)
GFR, Estimated: >60 mL/min (ref >60)
Glucose, Bld: 143 mg/dL — ABNORMAL HIGH (ref 70–99)
Potassium: 3.6 mmol/L (ref 3.5–5.1)
Sodium: 139 mmol/L (ref 135–145)
Total Bilirubin: 0.8 mg/dL (ref 0.0–1.2)
Total Protein: 5.6 g/dL — ABNORMAL LOW (ref 6.5–8.1)

## 2023-09-16 LAB — CBC WITH DIFFERENTIAL/PLATELET
Abs Immature Granulocytes: 0.04 10*3/uL (ref 0.00–0.07)
Basophils Absolute: 0.1 10*3/uL (ref 0.0–0.1)
Basophils Relative: 1 %
Eosinophils Absolute: 2.8 10*3/uL — ABNORMAL HIGH (ref 0.0–0.5)
Eosinophils Relative: 26 %
HCT: 33.6 % — ABNORMAL LOW (ref 36.0–46.0)
Hemoglobin: 10.7 g/dL — ABNORMAL LOW (ref 12.0–15.0)
Immature Granulocytes: 0 %
Lymphocytes Relative: 14 %
Lymphs Abs: 1.5 10*3/uL (ref 0.7–4.0)
MCH: 29.6 pg (ref 26.0–34.0)
MCHC: 31.8 g/dL (ref 30.0–36.0)
MCV: 93.1 fL (ref 80.0–100.0)
Monocytes Absolute: 0.7 10*3/uL (ref 0.1–1.0)
Monocytes Relative: 7 %
Neutro Abs: 5.7 10*3/uL (ref 1.7–7.7)
Neutrophils Relative %: 52 %
Platelets: 232 10*3/uL (ref 150–400)
RBC: 3.61 MIL/uL — ABNORMAL LOW (ref 3.87–5.11)
RDW: 14.9 % (ref 11.5–15.5)
WBC: 10.8 10*3/uL — ABNORMAL HIGH (ref 4.0–10.5)
nRBC: 0 % (ref 0.0–0.2)

## 2023-09-16 LAB — RESP PANEL BY RT-PCR (RSV, FLU A&B, COVID)  RVPGX2
Influenza A by PCR: NEGATIVE
Influenza B by PCR: NEGATIVE
Resp Syncytial Virus by PCR: NEGATIVE
SARS Coronavirus 2 by RT PCR: NEGATIVE

## 2023-09-16 LAB — PATHOLOGIST SMEAR REVIEW

## 2023-09-16 LAB — TROPONIN I (HIGH SENSITIVITY)
Troponin I (High Sensitivity): 22 ng/L — ABNORMAL HIGH (ref <18)
Troponin I (High Sensitivity): 17 ng/L (ref <18)

## 2023-09-16 MED ORDER — LIDOCAINE VISCOUS HCL 2 % MT SOLN
15.0000 mL | Freq: Once | OROMUCOSAL | Status: DC
Start: 2023-09-16 — End: 2023-09-16

## 2023-09-16 MED ORDER — METOCLOPRAMIDE HCL 5 MG/ML IJ SOLN
10.0000 mg | INTRAMUSCULAR | Status: AC
  Administered 2023-09-16: 10 mg via INTRAVENOUS
  Filled 2023-09-16: qty 2

## 2023-09-16 MED ORDER — OXYCODONE HCL 5 MG PO TABS
5.0000 mg | ORAL_TABLET | Freq: Once | ORAL | Status: AC
  Administered 2023-09-16: 5 mg via ORAL
  Filled 2023-09-16: qty 1

## 2023-09-16 MED ORDER — ALUM & MAG HYDROXIDE-SIMETH 200-200-20 MG/5ML PO SUSP
30.0000 mL | Freq: Once | ORAL | Status: AC
  Administered 2023-09-16: 30 mL via ORAL
  Filled 2023-09-16: qty 30

## 2023-09-16 MED ORDER — IOHEXOL 300 MG/ML  SOLN
100.0000 mL | Freq: Once | INTRAMUSCULAR | Status: AC | PRN
  Administered 2023-09-16: 100 mL via INTRAVENOUS

## 2023-09-16 MED ORDER — DROPERIDOL 2.5 MG/ML IJ SOLN
1.2500 mg | Freq: Once | INTRAMUSCULAR | Status: AC
  Administered 2023-09-16: 1.25 mg via INTRAVENOUS
  Filled 2023-09-16: qty 2

## 2023-09-16 MED ORDER — PANTOPRAZOLE SODIUM 40 MG IV SOLR
40.0000 mg | Freq: Once | INTRAVENOUS | Status: AC
  Administered 2023-09-16: 40 mg via INTRAVENOUS
  Filled 2023-09-16: qty 10

## 2023-09-16 MED ORDER — PROMETHAZINE HCL 25 MG RE SUPP: 25.0000 mg | Freq: Four times a day (QID) | RECTAL | 0 refills | Status: DC | PRN

## 2023-09-16 MED ORDER — LACTATED RINGERS IV BOLUS
1000.0000 mL | Freq: Once | INTRAVENOUS | Status: AC
  Administered 2023-09-16: 1000 mL via INTRAVENOUS

## 2023-09-16 MED ORDER — ONDANSETRON 4 MG PO TBDP
4.0000 mg | ORAL_TABLET | Freq: Once | ORAL | Status: AC
  Administered 2023-09-16: 4 mg via ORAL
  Filled 2023-09-16: qty 1

## 2023-09-16 MED ORDER — ONDANSETRON 4 MG PO TBDP: 4.0000 mg | ORAL_TABLET | Freq: Three times a day (TID) | ORAL | 0 refills | Status: DC | PRN

## 2023-09-16 MED ORDER — DIPHENHYDRAMINE HCL 50 MG/ML IJ SOLN
12.5000 mg | Freq: Once | INTRAMUSCULAR | Status: AC
  Administered 2023-09-16: 12.5 mg via INTRAVENOUS
  Filled 2023-09-16: qty 1

## 2023-09-16 MED ORDER — SODIUM CHLORIDE 0.9 % IV BOLUS
1000.0000 mL | Freq: Once | INTRAVENOUS | Status: AC
  Administered 2023-09-16: 1000 mL via INTRAVENOUS

## 2023-09-16 MED ORDER — MORPHINE SULFATE (PF) 4 MG/ML IV SOLN
4.0000 mg | Freq: Once | INTRAVENOUS | Status: AC
  Administered 2023-09-16: 4 mg via INTRAVENOUS
  Filled 2023-09-16: qty 1

## 2023-09-16 MED ORDER — ONDANSETRON HCL 4 MG/2ML IJ SOLN
4.0000 mg | Freq: Once | INTRAMUSCULAR | Status: AC
  Administered 2023-09-16: 4 mg via INTRAVENOUS
  Filled 2023-09-16: qty 2

## 2023-09-16 NOTE — Discharge Instructions (Addendum)
 Please continue to take your antibiotics for diverticulitis.  You may use the nausea meds we are providing you.  Please return immediately if you develop fevers, chills, worsening abdominal pain, inability to keep food or liquids down or take your medications.  Of note, your CBC did show some abnormal shaped cells. Please follow-up with your primary doctor soon as possible, we are also providing you to the number to a hematologist that you can follow-up with as well.

## 2023-09-16 NOTE — ED Provider Notes (Signed)
 [place] EMERGENCY DEPARTMENT AT [facility] Provider Note   CSN: [id number] Arrival date & time: 09/16/23  1308     History  Chief Complaint  Patient presents with   Nausea   Emesis   Abdominal Pain    [name] is a 78 y.o. female.  78 year old female with recently diagnosed diverticulitis presents emergency department with nausea and vomiting.  Patient was recently diagnosed with uncomplicated diverticulitis.  Started on cipro and Flagyl.  Unfortunately afterwards started developing nausea and vomiting and came to the emergency department yesterday.  Received Zofran and 2 doses of droperidol and was able to go home afterwards.  Says that she tried taking her antibiotics and since the directions say to take it with food she started becoming nauseous afterwards.  Tried her promethazine suppository without relief.  Did not try the Zofran and decided to come back to the emergency department for evaluation.  Says that she would like some IV fluids and nausea medicine and to go home.      Home Medications Prior to Admission medications   Medication Sig Start Date End Date Taking? Authorizing Provider  albuterol (VENTOLIN HFA) 108 (90 Base) MCG/ACT inhaler Inhale 2 puffs into the lungs every 6 (six) hours as needed for wheezing or shortness of breath.    [provider]  ALPRAZolam Prudy Feeler) 0.5 MG tablet Take 0.5 mg by mouth at bedtime.    [provider]  budesonide-formoterol (SYMBICORT) 160-4.5 MCG/ACT inhaler Inhale 2 puffs into the lungs 2 (two) times daily.    [provider]  Calcium Carb-Cholecalciferol (CALCIUM+D3 PO) Take 1 capsule by mouth every morning. Calcium 1200 mg, vitamin D 25 mcg/1000 units    [provider]  carvedilol (COREG) 3.125 MG tablet Take 1 tablet (3.125 mg total) by mouth 2 (two) times daily. 06/24/23 09/22/23  Rollene Rotunda, MD  Cetirizine HCl (ZYRTEC ALLERGY) 10 MG CAPS Take 10 mg by mouth daily as  needed (seasonal allergies). 12/08/16   [provider]  Cholecalciferol (VITAMIN D) 50 MCG (2000 UT) CAPS Take 2,000 Units by mouth every morning.    [provider]  citalopram (CELEXA) 20 MG tablet Take 10 mg by mouth every morning. 12/23/17   [provider]  denosumab (PROLIA) 60 MG/ML SOSY injection Inject 60 mg into the skin every 6 (six) months.    [provider]  HYDROcodone-acetaminophen (NORCO/VICODIN) 5-325 MG tablet Take 1 tablet by mouth every 4 (four) hours as needed. 09/10/23   Rolan Bucco, MD  ibuprofen (ADVIL,MOTRIN) 200 MG tablet Take 400 mg by mouth every 6 (six) hours as needed for fever or headache (pain).    [provider]  levothyroxine (SYNTHROID, LEVOTHROID) 75 MCG tablet Take 75 mcg by mouth daily before breakfast.    [provider]  ondansetron (ZOFRAN-ODT) 4 MG disintegrating tablet Take 1 tablet (4 mg total) by mouth every 8 (eight) hours as needed for nausea or vomiting. 09/16/23   Coral Spikes, DO  polyvinyl alcohol (LIQUIFILM TEARS) 1.4 % ophthalmic solution Place 1 drop into both eyes daily as needed for dry eyes.    [provider]  promethazine (PHENERGAN) 25 MG suppository Place 1 suppository (25 mg total) rectally every 6 (six) hours as needed for nausea or vomiting. 09/16/23   Coral Spikes, DO  rosuvastatin (CRESTOR) 5 MG tablet Take 5 mg by mouth every other day. 02/08/21   [provider]      Allergies  Remicade [infliximab], Amoxicillin-pot clavulanate, and Sucralfate    Review of Systems   Review of Systems  Physical Exam Updated Vital Signs BP (!) 155/89 (BP Location: Left Arm)   Pulse (!) 113   Temp 99 F (37.2 C) (Oral)   Resp 18   Ht 5\' 2"  (1.575 m)   Wt 53.4 kg   SpO2 92%   BMI 21.53 kg/m  Physical Exam Vitals and nursing note reviewed.  Constitutional:      General: She is not in acute distress.    Appearance: She is well-developed.     Comments:  Uncomfortable appearing.  Holding emesis basin next to her.  HENT:     Head: Normocephalic and atraumatic.     Right Ear: External ear normal.     Left Ear: External ear normal.     Nose: Nose normal.  Eyes:     Extraocular Movements: Extraocular movements intact.     Conjunctiva/sclera: Conjunctivae normal.     Pupils: Pupils are equal, round, and reactive to light.  Abdominal:     General: Abdomen is flat. There is no distension.     Palpations: Abdomen is soft. There is no mass.     Tenderness: There is no abdominal tenderness. There is no guarding.  Musculoskeletal:     Cervical back: Normal range of motion and neck supple.     Right lower leg: No edema.     Left lower leg: No edema.  Skin:    General: Skin is warm and dry.  Neurological:     Mental Status: She is alert and oriented to person, place, and time. Mental status is at baseline.  Psychiatric:        Mood and Affect: Mood normal.     ED Results / Procedures / Treatments   Labs (all labs ordered are listed, but only abnormal results are displayed) Labs Reviewed - No data to display  EKG None  Radiology CT ABDOMEN PELVIS W CONTRAST Result Date: 09/16/2023 CLINICAL DATA:  Abdominal pain, acute, nonlocalized. Epigastric pain, nausea and vomiting.  On Cipro for diverticulitis. EXAM: CT ABDOMEN AND PELVIS WITH CONTRAST TECHNIQUE: Multidetector CT imaging of the abdomen and pelvis was performed using the standard protocol following bolus administration of intravenous contrast. RADIATION DOSE REDUCTION: This exam was performed according to the departmental dose-optimization program which includes automated exposure control, adjustment of the mA and/or kV according to patient size and/or use of iterative reconstruction technique. CONTRAST:  OMNIPAQUE IOHEXOL 300 MG/ML  SOLN COMPARISON:  CTs with IV contrast 09/10/2023 and 07/12/2023. FINDINGS: Lower chest: Stable peripheral bronchiolectasis in the anterior lung bases.  Elsewhere scattered subpleural reticulation. No focal pneumonic infiltrate. Diffuse bronchial thickening in both lower lobes. The cardiac size is normal.  No substantial pericardial effusion. Hepatobiliary: 18 cm length mildly steatotic liver. Few tiny hypoattenuating foci in hepatic segment 8 are too small to characterize but unchanged. No mass enhancement. There is focal periligamentous fat in segment 4B. Stable prominence of the hepatic portal vein 16 mm. Gallbladder and bile ducts are unremarkable. Pancreas: No abnormality. Spleen: Stable enlargement, 14.2 cm length.  No mass enhancement. Adrenals/Urinary Tract: Innumerable bilateral renal cysts are again noted and a few renal sinus cysts. There are bilateral extrarenal pelves. The cysts ranging in size from a few mm up to 2 cm. No new or suspicious cystic or solid lesions are seen. No follow-up imaging recommended. There is no urinary stone or obstruction. There is no bladder thickening. Stomach/Bowel: There is  improvement in prior finding of distal sigmoid diverticulitis. Inflammatory stranding noted previously has nearly resolved and the wall thickening is improved. Upstream of this there are numerous uncomplicated sigmoid diverticula, additional diverticulosis in the descending colon. The gastric wall, unopacified small bowel are unremarkable. There is a normal caliber appendix. Moderate increased retained stool ascending colon. Vascular/Lymphatic: Aortic atherosclerosis. No enlarged abdominal or pelvic lymph nodes. Reproductive: Unremarkable uterus, left adnexal structures. There is a right ovarian cyst measuring 4.3 x 3.4 cm, stable back to CT 06/09/2021, was not seen on CT 10/24/2010. This appears uncomplicated and measures 9 Hounsfield units density. Other: No abdominal wall hernia or abnormality. No abdominopelvic ascites. Musculoskeletal: There is lower lumbar facet hypertrophy, greatest at L4-5 where there is grade 1 spondylolisthesis. There is  moderate chronic anterior wedging of the T11 vertebral body. No acute or aggressive osseous process. Osteophytes of the pubic symphysis, SI joints. IMPRESSION: 1. Improvement in prior finding of distal sigmoid diverticulitis. No new or acute findings. 2. Constipation. 3. Stable 4.3 x 3.4 cm right ovarian cyst, not seen on CT 10/24/2010. Follow-up pelvic ultrasound recommended in 6-12 months. Reference: JACR 2020 Feb;17(2):248-254 4. Stable mildly enlarged steatotic liver with stable prominence of the hepatic portal vein. 5. Stable splenomegaly. 6. Aortic atherosclerosis. 7. Stable peripheral bronchiolectasis in the anterior lung bases. 8. Diffuse bronchial thickening in both lower lobes. 9. Lower lumbar facet hypertrophy with grade 1 spondylolisthesis at L4-5. Aortic Atherosclerosis (ICD10-I70.0). Electronically Signed   By: Almira Bar M.D.   On: 09/16/2023 04:58    Procedures Procedures    Medications Ordered in ED Medications  oxyCODONE (Oxy IR/ROXICODONE) immediate release tablet 5 mg (5 mg Oral Given 09/16/23 1512)  ondansetron (ZOFRAN-ODT) disintegrating tablet 4 mg (4 mg Oral Given 09/16/23 1513)  sodium chloride 0.9 % bolus 1,000 mL (0 mLs Intravenous Stopped 09/16/23 1740)  metoCLOPramide (REGLAN) injection 10 mg (10 mg Intravenous Given 09/16/23 1659)  diphenhydrAMINE (BENADRYL) injection 12.5 mg (12.5 mg Intravenous Given 09/16/23 1659)    ED Course/ Medical Decision Making/ A&P                                 Medical Decision Making Risk Prescription drug management.   [name] is a 78 y.o. female with comorbidities that complicate the patient evaluation including diverticulitis who presents emergency department nausea and vomiting  Initial Ddx:  SBO, ileus, gastroenteritis, diverticulitis complication, medication side effect  MDM/Course:  Patient resents emergency department nausea and vomiting.  Was recently diagnosed diverticulitis.  Yesterday had a CT scan that did not  show any complications from her diverticulitis or SBO or ileus.  Suspect that her symptoms may be due to the antibiotics that she is taking since she says it happens most every time after she takes them.  Since she was recently in the hospital and she tried outpatient therapy did offer admission but she declined at this time.  Did just have labs several hours ago so do not feel that she needs repeat at this time.  States that she would prefer to go home.  She was given Zofran, Reglan, and Benadryl and eventually upon re-evaluation was able to tolerate p.o.  Also given IV fluids.  Do suspect that this may be related to the Cipro and Flagyl.  Since recent literature is showing that no antibiotics may be acceptable for treatment of diverticulitis without complication will have her hold off on the remainder of  the course of antibiotics since it seems to be causing more harm than benefit.  Already had Zofran and her Phenergan suppositories at home.  Will have her follow-up with her primary doctor in several days.  Instructed to come back to the emergency department should any of her symptoms worsen.  This patient presents to the ED for concern of complaints listed in HPI, this involves an extensive number of treatment options, and is a complaint that carries with it a high risk of complications and morbidity. Disposition including potential need for admission considered.   Dispo: DC Home. Return precautions discussed including, but not limited to, those listed in the AVS. Allowed pt time to ask questions which were answered fully prior to dc.  Records reviewed Outpatient Clinic Notes I have reviewed the patients home medications and made adjustments as needed Social Determinants of health:  Elderly  Portions of this note were generated with Scientist, clinical (histocompatibility and immunogenetics). Dictation errors may occur despite best attempts at proofreading.     Final Clinical Impression(s) / ED Diagnoses Final diagnoses:  Nausea  and vomiting, unspecified vomiting type  Diverticulitis    Rx / DC Orders ED Discharge Orders     None         Rondel Baton, MD 09/16/23 1806

## 2023-09-16 NOTE — ED Provider Triage Note (Signed)
 Emergency Medicine Provider Triage Evaluation Note  [name] , a 78 y.o. female  was evaluated in triage.  Pt complains of abdominal pain and vomiting.  Was evaluated here last night.  Felt to be improving and discharge.  Symptoms recurred.  Review of Systems  Positive:  Negative:   Physical Exam  BP (!) 146/99 (BP Location: Left Arm)   Pulse (!) 113   Temp 97.7 F (36.5 C) (Oral)   Resp 16   Ht 5\' 2"  (1.575 m)   Wt 53.4 kg   SpO2 96%   BMI 21.53 kg/m  Gen:   Awake, no distress   Resp:  Normal effort  MSK:   Moves extremities without difficulty  Other:  Generalized abdominal tenderness  Medical Decision Making  Medically screening exam initiated at 1:32 PM.  Appropriate orders placed.  [name] was informed that the remainder of the evaluation will be completed by another provider, this initial triage assessment does not replace that evaluation, and the importance of remaining in the ED until their evaluation is complete.     Halford Decamp, PA-C 09/16/23 1333

## 2023-09-16 NOTE — ED Triage Notes (Signed)
 Pt arrived reporting n/v and abdominal pain since last night. Was seen and d/c and states she took the prescribed meds. No relief.

## 2023-09-16 NOTE — ED Provider Notes (Signed)
 [place] EMERGENCY DEPARTMENT AT [facility] Provider Note  CSN: [id number] Arrival date & time: 09/16/23 0136  Chief Complaint(s) Emesis and Abdominal Pain  HPI [name] is a 78 y.o. female who is here today for vomiting.  Patient recently treated for diverticulitis with ciprofloxacin and metronidazole, earlier today she began to have acute epigastric pain with vomiting.  Patient has a past history significant for gastritis, says this feels similar.  Patient has not had fever, chills or diarrhea.   Past Medical History Past Medical History:  Diagnosis Date   Arthritis    Complication of anesthesia    Cough variant asthma 12/29/2017   12/29/2017  Walked RA x 3 laps @ 185 ft each stopped due to  End of study,fast pace, no sob or desat   Spirometry 12/29/2017  FEV1 1.93 (88%)  Ratio 71 p saba w/in 4 h prior - Allergy profile 12/29/2017 >  Eos 0.6 /  IgE  22 RAST pos  cat > dog and dust  - FENO 12/29/2017  =   36 - 12/29/2017  After extensive coaching inhaler device  effectiveness =    75% > try symbicort 80 2bid - PFT's  01/28/18  FEV1   Depression    Hypothyroidism    Osteomyelitis (HCC)    Mandible, right lower   PONV (postoperative nausea and vomiting)    Patient Active Problem List   Diagnosis Date Noted   Sensorineural hearing loss, 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 component RA bronchiolitis 03/29/2015   Anxiety 03/29/2015   Hypothyroidism 03/29/2015   Hyperglycemia 03/29/2015   Home Medication(s) Prior to Admission medications   Medication Sig Start Date End Date Taking? Authorizing Provider  albuterol (VENTOLIN HFA) 108 (90 Base) MCG/ACT inhaler Inhale 2 puffs into the lungs every 6 (six) hours as needed for wheezing or shortness of  breath.    [provider]  ALPRAZolam Prudy Feeler) 0.5 MG tablet Take 0.5 mg by mouth at bedtime.    [provider]  budesonide-formoterol (SYMBICORT) 160-4.5 MCG/ACT inhaler Inhale 2 puffs into the lungs 2 (two) times daily.    [provider]  Calcium Carb-Cholecalciferol (CALCIUM+D3 PO) Take 1 capsule by mouth every morning. Calcium 1200 mg, vitamin D 25 mcg/1000 units    [provider]  carvedilol (COREG) 3.125 MG tablet Take 1 tablet (3.125 mg total) by mouth 2 (two) times daily. 06/24/23 09/22/23  Rollene Rotunda, MD  Cetirizine HCl (ZYRTEC ALLERGY) 10 MG CAPS Take 10 mg by mouth daily as needed (seasonal allergies). 12/08/16   [provider]  Cholecalciferol (VITAMIN D) 50 MCG (2000 UT) CAPS Take 2,000 Units by mouth every morning.    [provider]  ciprofloxacin (CIPRO) 500 MG tablet Take 1 tablet (500 mg total) by mouth 2 (two) times daily. 09/10/23   Rolan Bucco, MD  citalopram (CELEXA) 20 MG tablet Take 10 mg by mouth every morning. 12/23/17   [provider]  denosumab (PROLIA) 60 MG/ML SOSY injection Inject 60 mg into the skin every 6 (six) months.    [provider]  HYDROcodone-acetaminophen (NORCO/VICODIN) 5-325 MG tablet Take 1 tablet by mouth every 4 (four) hours as needed. 09/10/23   Rolan Bucco, MD  ibuprofen (ADVIL,MOTRIN) 200 MG tablet Take 400 mg by  mouth every 6 (six) hours as needed for fever or headache (pain).    [provider]  levothyroxine (SYNTHROID, LEVOTHROID) 75 MCG tablet Take 75 mcg by mouth daily before breakfast.    [provider]  metroNIDAZOLE (FLAGYL) 500 MG tablet Take 1 tablet (500 mg total) by mouth 2 (two) times daily. 09/10/23   Rolan Bucco, MD  ondansetron (ZOFRAN-ODT) 4 MG disintegrating tablet Take 1 tablet (4 mg total) by mouth every 8 (eight) hours as needed for nausea or vomiting. 07/12/23   Sabas Sous, MD  polyvinyl alcohol (LIQUIFILM TEARS) 1.4 %  ophthalmic solution Place 1 drop into both eyes daily as needed for dry eyes.    [provider]  rosuvastatin (CRESTOR) 5 MG tablet Take 5 mg by mouth every other day. 02/08/21   [provider]                                                                                                                                    Past Surgical History Past Surgical History:  Procedure Laterality Date   HAND SURGERY Bilateral    Synovial tissue removed   TOOTH EXTRACTION Right Oct 2016   Then had to have fluid drained from abcess x 2   Family History History reviewed. No pertinent family history.  Social History Social History   Tobacco Use   Smoking status: Former    Types: Cigarettes   Smokeless tobacco: Never  Vaping Use   Vaping status: Never Used  Substance Use Topics   Alcohol use: No   Drug use: No   Allergies Remicade [infliximab], Amoxicillin-pot clavulanate, and Sucralfate  Review of Systems Review of Systems  Physical Exam Vital Signs  I have reviewed the triage vital signs BP (!) 153/82   Pulse 95   Temp 97.6 F (36.4 C) (Oral)   Resp 18   SpO2 99%   Physical Exam Vitals reviewed.  Abdominal:     General: Abdomen is flat.     Palpations: Abdomen is soft.     Tenderness: There is no abdominal tenderness. There is no right CVA tenderness, left CVA tenderness or guarding. Negative signs include Murphy's sign and McBurney's sign.     ED Results and Treatments Labs (all labs ordered are listed, but only abnormal results are displayed) Labs Reviewed  RESP PANEL BY RT-PCR (RSV, FLU A&B, COVID)  RVPGX2  COMPREHENSIVE METABOLIC PANEL  CBC WITH DIFFERENTIAL/PLATELET  TROPONIN I (HIGH SENSITIVITY)  Radiology No results found.  Pertinent labs & imaging results that were available during my care of the patient were reviewed  by me and considered in my medical decision making (see MDM for details).  Medications Ordered in ED Medications  pantoprazole (PROTONIX) injection 40 mg (has no administration in time range)  morphine (PF) 4 MG/ML injection 4 mg (4 mg Intravenous Given 09/16/23 0219)  ondansetron (ZOFRAN) injection 4 mg (4 mg Intravenous Given 09/16/23 0218)  lactated ringers bolus 1,000 mL (1,000 mLs Intravenous New Bag/Given 09/16/23 0228)                                                                                                                                     Procedures Procedures  (including critical care time)  Medical Decision Making / ED Course   This patient presents to the ED for concern of vomiting abdominal, this involves an extensive number of treatment options, and is a complaint that carries with it a high risk of complications and morbidity.  The differential diagnosis includes gastritis, bowel obstruction, less likely perforation, less likely diverticulitis.  MDM: Will obtain imaging of patient's abdomen.  Will begin treating with Zofran, Protonix.  CBC and CMP ordered.  Abdomen overall soft, lower suspicion for perforation, abscess.  Reassessment 6:50 AM-patient's symptoms persisted, did improve slightly with droperidol.  CT imaging negative.  Troponin flat.  Hemoglobin within typical range.  Normal renal function.  Patient not able to tolerate p.o.  Have provided some additional droperidol.  Patient was signed out to Dr. Maple Hudson pending p.o. challenge  Additional history obtained: -Additional history obtained from  -External records from outside source obtained and reviewed including: Chart review including previous notes, labs, imaging, consultation notes   Lab Tests: -I ordered, reviewed, and interpreted labs.   The pertinent results include:   Labs Reviewed  RESP PANEL BY RT-PCR (RSV, FLU A&B, COVID)  RVPGX2  COMPREHENSIVE METABOLIC PANEL  CBC WITH DIFFERENTIAL/PLATELET   TROPONIN I (HIGH SENSITIVITY)      EKG my independent review of the patient's EKG shows no ST segment depressions or elevations, no T wave inversions, no evidence of acute ischemia.  EKG Interpretation Date/Time:    Ventricular Rate:    PR Interval:    QRS Duration:    QT Interval:    QTC Calculation:   R Axis:      Text Interpretation:           Imaging Studies ordered: I ordered imaging studies including CT imaging of the abdomen pelvis I independently visualized and interpreted imaging. I agree with the radiologist interpretation   Medicines ordered and prescription drug management: Meds ordered this encounter  Medications   morphine (PF) 4 MG/ML injection 4 mg   ondansetron (ZOFRAN) injection 4 mg   lactated ringers bolus 1,000 mL   pantoprazole (PROTONIX) injection 40 mg    -I have reviewed the patients home medicines and  have made adjustments as needed   Cardiac Monitoring: The patient was maintained on a cardiac monitor.  I personally viewed and interpreted the cardiac monitored which showed an underlying rhythm of: Normal sinus rhythm  Social Determinants of Health:  Factors impacting patients care include:    Reevaluation: After the interventions noted above, I reevaluated the patient and found that they have :improved  Co morbidities that complicate the patient evaluation  Past Medical History:  Diagnosis Date   Arthritis    Complication of anesthesia    Cough variant asthma 12/29/2017   12/29/2017  Walked RA x 3 laps @ 185 ft each stopped due to  End of study,fast pace, no sob or desat   Spirometry 12/29/2017  FEV1 1.93 (88%)  Ratio 71 p saba w/in 4 h prior - Allergy profile 12/29/2017 >  Eos 0.6 /  IgE  22 RAST pos  cat > dog and dust  - FENO 12/29/2017  =   36 - 12/29/2017  After extensive coaching inhaler device  effectiveness =    75% > try symbicort 80 2bid - PFT's  01/28/18  FEV1   Depression    Hypothyroidism    Osteomyelitis (HCC)    Mandible,  right lower   PONV (postoperative nausea and vomiting)       Dispostion: Signed out to Dr. Maple Hudson.     Final Clinical Impression(s) / ED Diagnoses Final diagnoses:  None     @PCDICTATION @    Anders Simmonds T, DO 09/16/23 [phone]

## 2023-09-16 NOTE — Discharge Instructions (Signed)
 You were seen for your nausea and vomiting in the emergency department.  At home, please take the Zofran and suppository for your nausea and vomiting. Please be sure to stay well-hydrated. You may stop the antibiotics for your diverticulitis.   Follow-up with your primary doctor in 2-3 days regarding your visit.  Return immediately to the emergency department if you experience any of the following: dizziness, severe abdominal pain, high fevers, or any other concerning symptoms.  Thank you for visiting our Emergency Department. It was a pleasure taking care of you today.

## 2023-09-16 NOTE — ED Triage Notes (Signed)
 BIBA, c/o N/V, severe epigastric ABD pain, started today, was seen recently for diverticulitis and prescribed ciprofloxacin. Denies fever or diarrhea. Denies any other symptoms.

## 2023-09-16 NOTE — ED Provider Notes (Signed)
 Received signout from Dr. Andria Meuse.  See his note for full HPI.  Signed out pending reevaluation after antiemetics and p.o. challenge.  Clinical Course as of 09/16/23 0834  Thu Sep 16, 2023  0525 Ovalocytes: PRESENT [JS]  0525 WBC Morphology: SMUDGE CELLS [JS]  0744 Feeling improved; will p.o. challenge. [TY]  [id number] Patient is tolerating p.o. will discharge with anti emetics.  [TY]    Clinical Course User Index [JS] Arletha Pili, DO [TY] Coral Spikes, DO      Coral Spikes, [place] 09/16/23 [phone]

## 2023-09-16 NOTE — ED Notes (Signed)
Pt given cup of water for fluid challenge 

## 2023-09-23 DIAGNOSIS — M0589 Other rheumatoid arthritis with rheumatoid factor of multiple sites: Secondary | ICD-10-CM | POA: Diagnosis not present

## 2023-09-24 DIAGNOSIS — I10 Essential (primary) hypertension: Secondary | ICD-10-CM | POA: Diagnosis not present

## 2023-09-24 DIAGNOSIS — M810 Age-related osteoporosis without current pathological fracture: Secondary | ICD-10-CM | POA: Diagnosis not present

## 2023-09-24 DIAGNOSIS — E039 Hypothyroidism, unspecified: Secondary | ICD-10-CM | POA: Diagnosis not present

## 2023-09-29 DIAGNOSIS — M150 Primary generalized (osteo)arthritis: Secondary | ICD-10-CM | POA: Diagnosis not present

## 2023-09-29 DIAGNOSIS — M810 Age-related osteoporosis without current pathological fracture: Secondary | ICD-10-CM | POA: Diagnosis not present

## 2023-09-29 DIAGNOSIS — J45991 Cough variant asthma: Secondary | ICD-10-CM | POA: Diagnosis not present

## 2023-09-29 DIAGNOSIS — N83201 Unspecified ovarian cyst, right side: Secondary | ICD-10-CM | POA: Diagnosis not present

## 2023-09-29 DIAGNOSIS — R799 Abnormal finding of blood chemistry, unspecified: Secondary | ICD-10-CM | POA: Diagnosis not present

## 2023-09-29 DIAGNOSIS — I10 Essential (primary) hypertension: Secondary | ICD-10-CM | POA: Diagnosis not present

## 2023-09-29 DIAGNOSIS — D849 Immunodeficiency, unspecified: Secondary | ICD-10-CM | POA: Diagnosis not present

## 2023-09-29 DIAGNOSIS — E039 Hypothyroidism, unspecified: Secondary | ICD-10-CM | POA: Diagnosis not present

## 2023-09-29 DIAGNOSIS — I2510 Atherosclerotic heart disease of native coronary artery without angina pectoris: Secondary | ICD-10-CM | POA: Diagnosis not present

## 2023-09-29 DIAGNOSIS — Z0000 Encounter for general adult medical examination without abnormal findings: Secondary | ICD-10-CM | POA: Diagnosis not present

## 2023-09-29 DIAGNOSIS — I700 Atherosclerosis of aorta: Secondary | ICD-10-CM | POA: Diagnosis not present

## 2023-09-29 DIAGNOSIS — M0589 Other rheumatoid arthritis with rheumatoid factor of multiple sites: Secondary | ICD-10-CM | POA: Diagnosis not present

## 2023-09-29 DIAGNOSIS — F339 Major depressive disorder, recurrent, unspecified: Secondary | ICD-10-CM | POA: Diagnosis not present

## 2023-09-29 LAB — LAB REPORT - SCANNED
EGFR: 73
HM Hepatitis Screen: NEGATIVE

## 2023-09-30 NOTE — Progress Notes (Signed)
 Marland Kitchen

## 2023-10-20 DIAGNOSIS — N83201 Unspecified ovarian cyst, right side: Secondary | ICD-10-CM | POA: Diagnosis not present

## 2023-10-25 DIAGNOSIS — K760 Fatty (change of) liver, not elsewhere classified: Secondary | ICD-10-CM | POA: Diagnosis not present

## 2023-10-25 DIAGNOSIS — D649 Anemia, unspecified: Secondary | ICD-10-CM | POA: Diagnosis not present

## 2023-10-25 DIAGNOSIS — R161 Splenomegaly, not elsewhere classified: Secondary | ICD-10-CM | POA: Diagnosis not present

## 2023-10-25 DIAGNOSIS — R1013 Epigastric pain: Secondary | ICD-10-CM | POA: Diagnosis not present

## 2023-10-25 DIAGNOSIS — Z8719 Personal history of other diseases of the digestive system: Secondary | ICD-10-CM | POA: Diagnosis not present

## 2023-10-26 DIAGNOSIS — M0589 Other rheumatoid arthritis with rheumatoid factor of multiple sites: Secondary | ICD-10-CM | POA: Diagnosis not present

## 2023-10-28 DIAGNOSIS — K2980 Duodenitis without bleeding: Secondary | ICD-10-CM | POA: Diagnosis not present

## 2023-10-28 DIAGNOSIS — K3189 Other diseases of stomach and duodenum: Secondary | ICD-10-CM | POA: Diagnosis not present

## 2023-10-28 DIAGNOSIS — R1013 Epigastric pain: Secondary | ICD-10-CM | POA: Diagnosis not present

## 2023-10-28 DIAGNOSIS — K2930 Chronic superficial gastritis without bleeding: Secondary | ICD-10-CM | POA: Diagnosis not present

## 2023-10-28 DIAGNOSIS — Q399 Congenital malformation of esophagus, unspecified: Secondary | ICD-10-CM | POA: Diagnosis not present

## 2023-11-05 DIAGNOSIS — K2930 Chronic superficial gastritis without bleeding: Secondary | ICD-10-CM | POA: Diagnosis not present

## 2023-11-05 DIAGNOSIS — K2980 Duodenitis without bleeding: Secondary | ICD-10-CM | POA: Diagnosis not present

## 2023-11-10 DIAGNOSIS — N83201 Unspecified ovarian cyst, right side: Secondary | ICD-10-CM | POA: Diagnosis not present

## 2023-11-15 DIAGNOSIS — F411 Generalized anxiety disorder: Secondary | ICD-10-CM | POA: Diagnosis not present

## 2023-11-17 DIAGNOSIS — N83201 Unspecified ovarian cyst, right side: Secondary | ICD-10-CM | POA: Diagnosis not present

## 2023-11-18 DIAGNOSIS — M0589 Other rheumatoid arthritis with rheumatoid factor of multiple sites: Secondary | ICD-10-CM | POA: Diagnosis not present

## 2023-11-23 ENCOUNTER — Telehealth: Payer: Self-pay | Admitting: *Deleted

## 2023-11-23 NOTE — Telephone Encounter (Signed)
 Spoke with the patient regarding the referral to GYN oncology. Patient scheduled as new patient with Dr Orvil Bland on 5/30 at 9 am. Patient given an arrival time of 8:30 am.  Explained to the patient the the doctor will perform a pelvic exam at this visit. Patient given the policy that only one visitor allowed and that visitor must be over 16 yrs are allowed in the Cancer Center. Patient given the address/phone number for the clinic and that the center offers free valet service. Patient aware that masks required.

## 2023-12-02 DIAGNOSIS — F411 Generalized anxiety disorder: Secondary | ICD-10-CM | POA: Diagnosis not present

## 2023-12-08 ENCOUNTER — Encounter: Payer: Self-pay | Admitting: Gynecologic Oncology

## 2023-12-09 ENCOUNTER — Encounter: Payer: Self-pay | Admitting: Gynecologic Oncology

## 2023-12-09 NOTE — Progress Notes (Unsigned)
 GYNECOLOGIC ONCOLOGY NEW PATIENT CONSULTATION   Patient Name: [name]  Patient Age: 78 y.o. Date of Service: 12/10/23 Referring Provider: Dr. Leighton Punches  Primary Care Provider: Imelda Man, MD Consulting Provider: Wiley Hanger, MD   Assessment/Plan:  Postmenopausal patient with 2 small simple appearing adnexal cysts on recent ultrasound to follow-up CT findings.  We reviewed recent imaging findings including CT scan and pelvic ultrasound with her OB/GYN.  On CT scan back in 2022, she had a similar sized simple appearing right adnexal mass.  Although I cannot see the images from her recent ultrasound, we discussed the report which showed 2 adjacent simple appearing cyst.  Luckily, she is asymptomatic.  We also reviewed her recent tumor markers.  CA125 was normal, HE4 was elevated. HE4 has been found to have a higher sensitivity and specificity then CA125.  One study founded positive predictive value was 95%. This marker can also be expressed in a wide range of benign ovarian tumors.  This tumor marker was checked a week apart in early May and had decreased from 134-124.  I suggested that we repeat both tumor markers today, especially a CA125 can be elevated in the setting of inflammation or infection (important given recent treatment of diverticulitis).  We discussed management strategies including diagnostic surgery and surveillance.  I discussed that minimally invasive surgery could be performed to remove both fallopian tubes and ovaries with the plan to send the enlarged ovary to pathology for frozen section to guide any additional procedures.  I think overall the risk that this represents a cancer, despite her elevated HE4, is quite low given appearance of the simple cysts.  The patient's strong preference is to avoid surgery.  Discussed getting repeat imaging in approximately 3 months after her last ultrasound, which would be in late July.  If her tumor markers are normal or  nearly normal today, I think this would be reasonable to do with her OB/GYN.  If HE4 is still elevated, I recommended that we perform her ultrasound here so that I can see the pictures.  Depending on the results of this ultrasound, we discussed possible recommendation for additional imaging with a pelvic MRI if further characterization of the adnexa is necessary.  A copy of this note was sent to the patient's referring provider.   55 minutes of total time was spent for this patient encounter, including preparation, face-to-face counseling with the patient and coordination of care, and documentation of the encounter.  Wiley Hanger, MD  Division of Gynecologic Oncology  Department of Obstetrics and Gynecology  [facility]  ___________________________________________  Chief Complaint: Chief Complaint  Patient presents with   Cyst of ovary, unspecified laterality    History of Present Illness:  [name] is a 78 y.o. y.o. female who is seen in consultation at the request of Dr. Finis Hugger for an evaluation of an adnexal mass.  The patient underwent CT of the abdomen and pelvis in December 2020 for in the setting of 2 days of abdominal pain.  Stable cyst noted within the right adnexa measuring up to 3.7 cm with a smaller left adnexal cyst measuring 1.4 cm.  These were noted to be stable in appearance from prior CT scan dating back to 05/2021.  No adenopathy, no ascites. Repeat CT imaging in February of this year showed findings concerning for acute uncomplicated sigmoid diverticulitis.  Right adnexal cyst still noted, measuring up to 4.2 cm. Repeat imaging in 09/2023 revealed improvement in prior  findings of the distal sigmoid diverticulitis.  Stable 4.3 x 3.4 cm right ovarian cyst. Pelvic ultrasound exam on [facility]10[facility] revealed a uterus measuring 5.1 x 3.7 x 2.6 cm with an endometrial lining of 1.1 mm.  Right ovary measures up to 4.2 cm.  Left  ovary measures up to 1.6 cm.  Within the right ovary are 2 cysts, 1 measuring up to 2.6 cm and the other up to 1.4 cm.  Both cysts noted to be simple appearing, no blood flow seen. Tumor markers: CA-125: 36.6 HE-4: 134 (5/1), 124 (5/8)  The patient denies pelvic pain, bloating, early satiety, abnormal bleeding, or gastrointestinal or urinary symptoms. She has a history of sigmoid diverticulosis and experienced an episode of uncomplicated diverticulitis in February 2025, which resolved on follow-up imaging in March. She is currently asymptomatic and has expressed a preference to avoid surgery.   She follows with [facility] gastroenterology.  Had a history of C. difficile in mid 2023 requiring multiple courses of vancomycin.  Known diverticulosis seen on colonoscopy in 2017, again most recently in 2023 after her diverticulitis had resolved.  PAST MEDICAL HISTORY:  Past Medical History:  Diagnosis Date   Arthritis    Complication of anesthesia    Cough variant asthma 12/29/2017   12/29/2017  Walked RA x 3 laps @ 185 ft each stopped due to  End of study,fast pace, no sob or desat   Spirometry 12/29/2017  FEV1 1.93 (88%)  Ratio 71 p saba w/in 4 h prior - Allergy  profile 12/29/2017 >  Eos 0.6 /  IgE  22 RAST pos  cat > dog and dust  - FENO 12/29/2017  =   36 - 12/29/2017  After extensive coaching inhaler device  effectiveness =    75% > try symbicort  80 2bid - PFT's  01/28/18  FEV1   Depression    Hypothyroidism    Osteomyelitis (HCC)    Mandible, right lower   PONV (postoperative nausea and vomiting)      PAST SURGICAL HISTORY:  Past Surgical History:  Procedure Laterality Date   HAND SURGERY Bilateral    Synovial tissue removed   toncillectomy Bilateral 1952   TOOTH EXTRACTION Right 04/13/2015   Then had to have fluid drained from abcess x 2   TUBAL LIGATION Bilateral 1980    OB/GYN HISTORY:  OB History  Gravida Para Term Preterm AB Living  4 4      SAB IAB Ectopic Multiple Live Births           # Outcome Date GA Lbr Len/2nd Weight Sex Type Anes PTL Lv  4 Para           3 Para           2 Para           1 Para             No LMP recorded. Patient is postmenopausal.  Age at menarche: 52  Age at menopause: 78 Hx of HRT: denies Hx of STDs: denies Last pap: 2000 History of abnormal pap smears: denies  SCREENING STUDIES:  Last mammogram: unsure  Last colonoscopy: 2023  MEDICATIONS: Outpatient Encounter Medications as of 12/10/2023  Medication Sig   albuterol  (VENTOLIN  HFA) 108 (90 Base) MCG/ACT inhaler Inhale 2 puffs into the lungs every 6 (six) hours as needed for wheezing or shortness of breath.   ALPRAZolam  (XANAX ) 0.5 MG tablet Take 0.5 mg by mouth 3 (three) times daily as  needed for anxiety.   budesonide -formoterol  (SYMBICORT ) 160-4.5 MCG/ACT inhaler Inhale 2 puffs into the lungs 2 (two) times daily.   Calcium  Carb-Cholecalciferol (CALCIUM +D3 PO) Take 1 capsule by mouth every morning. Calcium  1200 mg, vitamin D 25 mcg/1000 units   Cholecalciferol (VITAMIN D) 50 MCG (2000 UT) CAPS Take 2,000 Units by mouth every morning.   denosumab  (PROLIA ) 60 MG/ML SOSY injection Inject 60 mg into the skin every 6 (six) months.   ibuprofen (ADVIL,MOTRIN) 200 MG tablet Take 400 mg by mouth every 6 (six) hours as needed for fever or headache (pain).   levocetirizine (XYZAL) 5 MG tablet Take 5 mg by mouth every evening.   levothyroxine  (SYNTHROID , LEVOTHROID) 75 MCG tablet Take 75 mcg by mouth daily before breakfast.   polyvinyl alcohol (LIQUIFILM TEARS) 1.4 % ophthalmic solution Place 1 drop into both eyes daily as needed for dry eyes.   rosuvastatin  (CRESTOR ) 5 MG tablet Take 5 mg by mouth every other day.   carvedilol  (COREG ) 3.125 MG tablet Take 1 tablet (3.125 mg total) by mouth 2 (two) times daily.   citalopram  (CELEXA ) 20 MG tablet Take 10 mg by mouth every morning.   HYDROcodone -acetaminophen  (NORCO/VICODIN) 5-325 MG tablet Take 1 tablet by mouth every 4 (four) hours as needed.    ondansetron  (ZOFRAN -ODT) 4 MG disintegrating tablet Take 1 tablet (4 mg total) by mouth every 8 (eight) hours as needed for nausea or vomiting.   promethazine  (PHENERGAN ) 25 MG suppository Place 1 suppository (25 mg total) rectally every 6 (six) hours as needed for nausea or vomiting.   [DISCONTINUED] Cetirizine HCl (ZYRTEC ALLERGY ) 10 MG CAPS Take 10 mg by mouth daily as needed (seasonal allergies).   No facility-administered encounter medications on file as of 12/10/2023.    ALLERGIES:  Allergies  Allergen Reactions   Remicade [Infliximab] Shortness Of Breath   Amoxicillin -Pot Clavulanate Itching, Photosensitivity and Rash   Sucralfate  Rash    PER PT     FAMILY HISTORY:  Family History  Problem Relation Age of Onset   Colon cancer Neg Hx    Breast cancer Neg Hx    Ovarian cancer Neg Hx    Endometrial cancer Neg Hx    Pancreatic cancer Neg Hx    Prostate cancer Neg Hx      SOCIAL HISTORY:  Social Connections: Not on file    REVIEW OF SYSTEMS:  Denies appetite changes, fevers, chills, fatigue, unexplained weight changes. Denies hearing loss, neck lumps or masses, mouth sores, ringing in ears or voice changes. Denies cough or wheezing.  Denies shortness of breath. Denies chest pain or palpitations. Denies leg swelling. Denies abdominal distention, pain, blood in stools, constipation, diarrhea, nausea, vomiting, or early satiety. Denies pain with intercourse, dysuria, frequency, hematuria or incontinence. Denies hot flashes, pelvic pain, vaginal bleeding or vaginal discharge.   Denies joint pain, back pain or muscle pain/cramps. Denies itching, rash, or wounds. Denies dizziness, headaches, numbness or seizures. Denies swollen lymph nodes or glands, denies easy bruising or bleeding. Denies anxiety, depression, confusion, or decreased concentration.  Physical Exam:  Vital Signs for this encounter:  Blood pressure 126/69, pulse 78, temperature 98.2 F (36.8 C),  temperature source Oral, height 5\' 2"  (1.575 m), weight 111 lb (50.3 kg), SpO2 100%. Body mass index is 20.3 kg/m. General: Alert, oriented, no acute distress.  HEENT: Normocephalic, atraumatic. Sclera anicteric.  Chest: Clear to auscultation bilaterally. No wheezes, rhonchi, or rales. Cardiovascular: Regular rate and rhythm, no murmurs, rubs, or gallops.  Abdomen: Normoactive bowel  sounds. Soft, nondistended, nontender to palpation. No masses or hepatosplenomegaly appreciated. No palpable fluid wave.  Extremities: Grossly normal range of motion. Warm, well perfused. No edema bilaterally.  Skin: No rashes or lesions.  Lymphatics: No cervical, supraclavicular, or inguinal adenopathy.  GU:  Normal external female genitalia. No lesions. No discharge or bleeding.             Bladder/urethra:  No lesions or masses, well supported bladder             Vagina: Moderately atrophic, no lesions.             Cervix: Normal appearing, no lesions.             Uterus: Small, mobile, no parametrial involvement or nodularity.             Adnexa: Mild fullness in the right adnexa, smooth, mobile.  Rectal: Deferred.  LABORATORY AND RADIOLOGIC DATA:  Outside medical records were reviewed to synthesize the above history, along with the history and physical obtained during the visit.   Lab Results  Component Value Date   WBC 10.8 (H) 09/16/2023   HGB 10.7 (L) 09/16/2023   HCT 33.6 (L) 09/16/2023   PLT 232 09/16/2023   GLUCOSE 143 (H) 09/16/2023   CHOL  10/24/2010    184        ATP III CLASSIFICATION:  <200     mg/dL   Desirable  629-528  mg/dL   Borderline High  >=413    mg/dL   High          TRIG 63 10/24/2010   HDL 63 10/24/2010   LDLCALC (H) 10/24/2010    108        Total Cholesterol/HDL:CHD Risk Coronary Heart Disease Risk Table                     Men   Women  1/2 Average Risk   3.4   3.3  Average Risk       5.0   4.4  2 X Average Risk   9.6   7.1  3 X Average Risk  23.4   11.0         Use the calculated Patient Ratio above and the CHD Risk Table to determine the patient's CHD Risk.        ATP III CLASSIFICATION (LDL):  <100     mg/dL   Optimal  244-010  mg/dL   Near or Above                    Optimal  130-159  mg/dL   Borderline  272-536  mg/dL   High  >644     mg/dL   Very High   ALT 13 03/47/4259   AST 18 09/16/2023   NA 139 09/16/2023   K 3.6 09/16/2023   CL 104 09/16/2023   CREATININE 0.72 09/16/2023   BUN 15 09/16/2023   CO2 26 09/16/2023   TSH 3.702 10/25/2010

## 2023-12-10 ENCOUNTER — Inpatient Hospital Stay

## 2023-12-10 ENCOUNTER — Encounter: Payer: Self-pay | Admitting: Gynecologic Oncology

## 2023-12-10 ENCOUNTER — Inpatient Hospital Stay: Attending: Gynecologic Oncology | Admitting: Gynecologic Oncology

## 2023-12-10 VITALS — BP 126/69 | HR 78 | Temp 98.2°F | Ht 62.0 in | Wt 111.0 lb

## 2023-12-10 DIAGNOSIS — D398 Neoplasm of uncertain behavior of other specified female genital organs: Secondary | ICD-10-CM | POA: Diagnosis not present

## 2023-12-10 DIAGNOSIS — Z8719 Personal history of other diseases of the digestive system: Secondary | ICD-10-CM

## 2023-12-10 DIAGNOSIS — R978 Other abnormal tumor markers: Secondary | ICD-10-CM

## 2023-12-10 DIAGNOSIS — E039 Hypothyroidism, unspecified: Secondary | ICD-10-CM | POA: Diagnosis not present

## 2023-12-10 DIAGNOSIS — F32A Depression, unspecified: Secondary | ICD-10-CM | POA: Insufficient documentation

## 2023-12-10 DIAGNOSIS — N83209 Unspecified ovarian cyst, unspecified side: Secondary | ICD-10-CM

## 2023-12-10 DIAGNOSIS — M869 Osteomyelitis, unspecified: Secondary | ICD-10-CM | POA: Diagnosis not present

## 2023-12-10 DIAGNOSIS — K5790 Diverticulosis of intestine, part unspecified, without perforation or abscess without bleeding: Secondary | ICD-10-CM | POA: Diagnosis not present

## 2023-12-10 DIAGNOSIS — Z79890 Hormone replacement therapy (postmenopausal): Secondary | ICD-10-CM | POA: Insufficient documentation

## 2023-12-10 DIAGNOSIS — Z79899 Other long term (current) drug therapy: Secondary | ICD-10-CM | POA: Diagnosis not present

## 2023-12-10 DIAGNOSIS — K5792 Diverticulitis of intestine, part unspecified, without perforation or abscess without bleeding: Secondary | ICD-10-CM | POA: Insufficient documentation

## 2023-12-10 DIAGNOSIS — Z8619 Personal history of other infectious and parasitic diseases: Secondary | ICD-10-CM

## 2023-12-10 DIAGNOSIS — Z7951 Long term (current) use of inhaled steroids: Secondary | ICD-10-CM | POA: Insufficient documentation

## 2023-12-10 DIAGNOSIS — M1990 Unspecified osteoarthritis, unspecified site: Secondary | ICD-10-CM | POA: Diagnosis not present

## 2023-12-10 NOTE — Consult Note (Signed)
 GYNECOLOGIC ONCOLOGY NEW PATIENT CONSULTATION   Patient Name: [name]  Patient Age: 78 y.o. Date of Service: 12/10/23 Referring Provider: Imelda Man, MD [address] [place],  [place] [address]   Primary Care Provider: Imelda Man, MD Consulting Provider: Wiley Hanger, MD   Assessment/Plan:  [name] a 78 y/o female with a PMH of diverticulitis, bilateral tubal ligation, and rheumatoid arthritis, presenting for follow-up of a stable right ovarian mass. Imaging over the past several years has consistently shown a simple 4.2 cm right ovarian cyst and no concerning symptoms related to the mass. Patient denies new weight loss, early satiety,  abdominal/pelvic pain, vaginal bleeding, or urinary/bowel changes.  Most recent pelvic ultrasound (11/10/2023) again revealed two simple-appearing right ovarian cysts (2.6 cm and 1.4 cm), with no internal blood flow. CA-125 is mildly elevated at 36.6, and HE-4 levels are elevated at 134 and 124 on serial testing. There are no symptoms suggestive of malignancy, and patient has expressed a preference to avoid surgery.  She also has a recent history of sigmoid diverticulitis, first noted in November 2022. February 2025 patient developed acute uncomplicated sigmoid diverticulitis and follow-up imaging in September 16, 2023 showed improvement with resolution of acute inflammatory findings after anitibiotic treatment. She currently denies abdominal pain, changes in bowel habits, or gastrointestinal symptoms.  Tumor markers - Repeat CA-125 and HE-4. - If markers are downtrending or stable then plan follow-up pelvic ultrasound with Dr. Finis Hugger. - If markers are uptrending, then perform repeat ultrasound here. Consider pelvic MRI for further characterization.  Ovarian cysts - Discussed findings, benign appearance of cysts, role of tumor markers, and current low suspicion for malignancy. - Reviewed red flag symptoms to monitor (e.g.,  bloating, abdominal/pelvic pain, early satiety, unintentional weight loss, or vaginal bleeding) and advised to report any new symptoms.  - Patient has expressed that she does not want to pursue surgical intervention.  - Continue conservative management given simple cyst morphology and stability on serial imaging  Diverticulitis - currently reports no symptoms - continue to monitor for LLQ pain, fever, changes in bowel habits.   A copy of this note was sent to the patient's referring provider.   Deedra Farr, Medical Student [facility]  ___________________________________________  Chief Complaint: Chief Complaint  Patient presents with   Cyst of ovary, unspecified laterality    History of Present Illness:  [name] is a 78 y.o. y.o. female who is seen in consultation at the request of Imelda Man, MD for an evaluation of of a stable right adnexal mass in the setting of mildly elevated tumor markers. The patient has a known history of a right ovarian cyst first identified in December 2020, which has remained stable in size and appearance over several years on serial imaging. Most recently, a pelvic ultrasound on 11/10/2023 showed a right ovary measuring 4.2 cm containing two simple cysts (2.6 cm and 1.4 cm). Tumor markers revealed a CA-125 of 36.6 and HE-4 values of 134 and 124 on repeat testing one week apart.  The patient denies pelvic pain, bloating, early satiety, abnormal bleeding, or gastrointestinal or urinary symptoms. She has a history of sigmoid diverticulosis and experienced an episode of uncomplicated diverticulitis in February 2025, which resolved on follow-up imaging in March. She is currently asymptomatic and has expressed a preference to avoid surgery. The purpose of today's visit is to determine appropriate follow-up of her adnexal findings and to guide ongoing surveillance based on tumor marker trends.  PAST MEDICAL HISTORY:  Past  Medical History:  Diagnosis Date   Arthritis    Complication of anesthesia    Cough variant asthma 12/29/2017   12/29/2017  Walked RA x 3 laps @ 185 ft each stopped due to  End of study,fast pace, no sob or desat   Spirometry 12/29/2017  FEV1 1.93 (88%)  Ratio 71 p saba w/in 4 h prior - Allergy  profile 12/29/2017 >  Eos 0.6 /  IgE  22 RAST pos  cat > dog and dust  - FENO 12/29/2017  =   36 - 12/29/2017  After extensive coaching inhaler device  effectiveness =    75% > try symbicort  80 2bid - PFT's  01/28/18  FEV1   Depression    Hypothyroidism    Osteomyelitis (HCC)    Mandible, right lower   PONV (postoperative nausea and vomiting)      PAST SURGICAL HISTORY:  Past Surgical History:  Procedure Laterality Date   HAND SURGERY Bilateral    Synovial tissue removed   toncillectomy Bilateral 1952   TOOTH EXTRACTION Right 04/13/2015   Then had to have fluid drained from abcess x 2   TUBAL LIGATION Bilateral 1980    OB/GYN HISTORY:  OB History  Gravida Para Term Preterm AB Living  4 4      SAB IAB Ectopic Multiple Live Births          # Outcome Date GA Lbr Len/2nd Weight Sex Type Anes PTL Lv  4 Para           3 Para           2 Para           1 Para             No LMP recorded. Patient is postmenopausal.  Age at menarche: 69 Age at menopause: 74 Hx of HRT: none Hx of STDs: none Last pap: 2000 History of abnormal pap smears: no  SCREENING STUDIES:  Last mammogram: 2015  Last colonoscopy: 2023 Last bone mineral density: none  MEDICATIONS: Outpatient Encounter Medications as of 12/10/2023  Medication Sig   albuterol  (VENTOLIN  HFA) 108 (90 Base) MCG/ACT inhaler Inhale 2 puffs into the lungs every 6 (six) hours as needed for wheezing or shortness of breath.   ALPRAZolam  (XANAX ) 0.5 MG tablet Take 0.5 mg by mouth 3 (three) times daily as needed for anxiety.   budesonide -formoterol  (SYMBICORT ) 160-4.5 MCG/ACT inhaler Inhale 2 puffs into the lungs 2 (two) times daily.   Calcium   Carb-Cholecalciferol (CALCIUM +D3 PO) Take 1 capsule by mouth every morning. Calcium  1200 mg, vitamin D 25 mcg/1000 units   Cholecalciferol (VITAMIN D) 50 MCG (2000 UT) CAPS Take 2,000 Units by mouth every morning.   denosumab  (PROLIA ) 60 MG/ML SOSY injection Inject 60 mg into the skin every 6 (six) months.   ibuprofen (ADVIL,MOTRIN) 200 MG tablet Take 400 mg by mouth every 6 (six) hours as needed for fever or headache (pain).   levocetirizine (XYZAL) 5 MG tablet Take 5 mg by mouth every evening.   levothyroxine  (SYNTHROID , LEVOTHROID) 75 MCG tablet Take 75 mcg by mouth daily before breakfast.   polyvinyl alcohol (LIQUIFILM TEARS) 1.4 % ophthalmic solution Place 1 drop into both eyes daily as needed for dry eyes.   rosuvastatin  (CRESTOR ) 5 MG tablet Take 5 mg by mouth every other day.   carvedilol  (COREG ) 3.125 MG tablet Take 1 tablet (3.125 mg total) by mouth 2 (two) times daily.   citalopram  (  CELEXA ) 20 MG tablet Take 10 mg by mouth every morning.   HYDROcodone -acetaminophen  (NORCO/VICODIN) 5-325 MG tablet Take 1 tablet by mouth every 4 (four) hours as needed.   ondansetron  (ZOFRAN -ODT) 4 MG disintegrating tablet Take 1 tablet (4 mg total) by mouth every 8 (eight) hours as needed for nausea or vomiting.   promethazine  (PHENERGAN ) 25 MG suppository Place 1 suppository (25 mg total) rectally every 6 (six) hours as needed for nausea or vomiting.   [DISCONTINUED] Cetirizine HCl (ZYRTEC ALLERGY ) 10 MG CAPS Take 10 mg by mouth daily as needed (seasonal allergies).   No facility-administered encounter medications on file as of 12/10/2023.    ALLERGIES:  Allergies  Allergen Reactions   Remicade [Infliximab] Shortness Of Breath   Amoxicillin -Pot Clavulanate Itching, Photosensitivity and Rash   Sucralfate  Rash    PER PT     FAMILY HISTORY:  Family History  Problem Relation Age of Onset   Colon cancer Neg Hx    Breast cancer Neg Hx    Ovarian cancer Neg Hx    Endometrial cancer Neg Hx     Pancreatic cancer Neg Hx    Prostate cancer Neg Hx      SOCIAL HISTORY:  Social Connections: Not on file    REVIEW OF SYSTEMS:  Denies appetite changes, fevers, chills, fatigue, unexplained weight changes. Denies hearing loss, neck lumps or masses, mouth sores, ringing in ears or voice changes. Denies cough or wheezing.  Denies shortness of breath. Denies chest pain or palpitations. Denies leg swelling. Denies abdominal distention, pain, blood in stools, constipation, diarrhea, nausea, vomiting, or early satiety. Denies pain with intercourse, dysuria, frequency, hematuria or incontinence. Denies hot flashes, pelvic pain, vaginal bleeding or vaginal discharge.   Denies joint pain, back pain or muscle pain/cramps. Denies itching, rash, or wounds. Denies dizziness, headaches, numbness or seizures. Denies swollen lymph nodes or glands, denies easy bruising or bleeding. Denies anxiety, depression, confusion, or decreased concentration.  Physical Exam:  Vital Signs for this encounter:  Blood pressure 126/69, pulse 78, temperature 98.2 F (36.8 C), temperature source Oral, height 5\' 2"  (1.575 m), weight 111 lb (50.3 kg), SpO2 100%. Body mass index is 20.3 kg/m. General: Alert, oriented, no acute distress.  HEENT: Normocephalic, atraumatic. Sclera anicteric.  Chest: Clear to auscultation bilaterally. No wheezes, rhonchi, or rales. Cardiovascular: Regular rate and rhythm, no murmurs, rubs, or gallops.  Abdomen: Normoactive bowel sounds. Soft, nondistended, nontender to palpation. No masses or hepatosplenomegaly appreciated. No palpable fluid wave.  Extremities: Grossly normal range of motion. Warm, well perfused. No edema bilaterally.  Skin: No rashes or lesions.  Lymphatics: No cervical, supraclavicular, or inguinal adenopathy.  GU:  Normal external female genitalia. No rashes or lesions. No discharge or bleeding.             Bladder/urethra:  No lesions or masses, well supported  bladder             Vagina: Atrophic but without lesions or discahrge             Cervix: Normal appearing, no lesions.             Uterus: Small, mobile, no parametrial involvement or nodularity.             Adnexa: Right adnexa with palpable, mobile, non-tender cystic structure consistent with known ovarian cyst; no left adnexal masses appreciated  Rectal: Normal tone. No masses or gross blood.   LABORATORY AND RADIOLOGIC DATA:  Outside medical records were reviewed to synthesize  the above history, along with the history and physical obtained during the visit.   Lab Results  Component Value Date   WBC 10.8 (H) 09/16/2023   HGB 10.7 (L) 09/16/2023   HCT 33.6 (L) 09/16/2023   PLT 232 09/16/2023   GLUCOSE 143 (H) 09/16/2023   CHOL  10/24/2010    184        ATP III CLASSIFICATION:  <200     mg/dL   Desirable  161-096  mg/dL   Borderline High  >=045    mg/dL   High          TRIG 63 10/24/2010   HDL 63 10/24/2010   LDLCALC (H) 10/24/2010    108        Total Cholesterol/HDL:CHD Risk Coronary Heart Disease Risk Table                     Men   Women  1/2 Average Risk   3.4   3.3  Average Risk       5.0   4.4  2 X Average Risk   9.6   7.1  3 X Average Risk  23.4   11.0        Use the calculated Patient Ratio above and the CHD Risk Table to determine the patient's CHD Risk.        ATP III CLASSIFICATION (LDL):  <100     mg/dL   Optimal  409-811  mg/dL   Near or Above                    Optimal  130-159  mg/dL   Borderline  914-782  mg/dL   High  >956     mg/dL   Very High   ALT 13 21/30/8657   AST 18 09/16/2023   NA 139 09/16/2023   K 3.6 09/16/2023   CL 104 09/16/2023   CREATININE 0.72 09/16/2023   BUN 15 09/16/2023   CO2 26 09/16/2023   TSH 3.702 10/25/2010

## 2023-12-10 NOTE — Patient Instructions (Signed)
 It was very nice to meet you today.  We discussed your recent CT images and pelvic ultrasound.  We reviewed the findings from your pelvic ultrasound which show simple cysts in around your right ovary.  Although I cannot see these pictures, based on the report, there are not features of the cyst that would raise significant concern for precancer or cancer.  However, you had some blood tests or tumor markers and 1 of these was elevated.  We will plan to repeat these today.  If these are normal now, I think having a follow-up ultrasound with Dr. Silvia Drummer office would be very reasonable.  Otherwise, we will plan to repeat your ultrasound approximately 3 months after your last ultrasound here at the hospital.  If you have any new or concerning symptoms, such as those we discussed today before the next time we talk, please call our office at [phone].

## 2023-12-11 LAB — CA 125: Cancer Antigen (CA) 125: 34.8 U/mL (ref 0.0–38.1)

## 2023-12-12 ENCOUNTER — Ambulatory Visit: Payer: Self-pay | Admitting: Gynecologic Oncology

## 2023-12-17 ENCOUNTER — Encounter: Payer: Self-pay | Admitting: Obstetrics and Gynecology

## 2023-12-20 ENCOUNTER — Telehealth: Payer: Self-pay

## 2023-12-20 ENCOUNTER — Other Ambulatory Visit: Payer: Self-pay | Admitting: Gynecologic Oncology

## 2023-12-20 DIAGNOSIS — N83209 Unspecified ovarian cyst, unspecified side: Secondary | ICD-10-CM

## 2023-12-20 DIAGNOSIS — R978 Other abnormal tumor markers: Secondary | ICD-10-CM

## 2023-12-20 NOTE — Telephone Encounter (Signed)
 I spoke to [facility] in [facility] lab to check on status of the HE4. Pam states she doesn't see where the lab was drawn at the same time as the CA125. I told her about speaking to someone last week who said the lab was drawn and sent to [facility].  She will check with the phlebotomist who drew lab and find out why it was not drawn. In the meantime,   [name] is aware of needing to come back in for a lab appointment. Lab appointment scheduled for Wednesday 6/11 @ 9:00. Pt agreed to date/time. (She declined coming earlier)   Dr.Tucker notified

## 2023-12-20 NOTE — Telephone Encounter (Signed)
 Pam called back from [facility] lab. She states, it may be a long shot but she is going to call [facility] to see if they still have the blood and can add the HE4 onto blood specimen.   Either way she will call us  back and let us  know.   Lab order has to be changed to [facility] Test# [id number] Ovarian cancer monitor.

## 2023-12-22 ENCOUNTER — Inpatient Hospital Stay: Attending: Gynecologic Oncology

## 2023-12-22 ENCOUNTER — Other Ambulatory Visit: Payer: Self-pay | Admitting: Gynecologic Oncology

## 2023-12-22 DIAGNOSIS — N83209 Unspecified ovarian cyst, unspecified side: Secondary | ICD-10-CM | POA: Insufficient documentation

## 2023-12-22 DIAGNOSIS — R978 Other abnormal tumor markers: Secondary | ICD-10-CM | POA: Insufficient documentation

## 2023-12-23 ENCOUNTER — Ambulatory Visit: Payer: Self-pay | Admitting: Gynecologic Oncology

## 2023-12-23 LAB — MISC LABCORP TEST (SEND OUT): Labcorp test code: 81610

## 2023-12-30 ENCOUNTER — Telehealth: Payer: Self-pay

## 2023-12-30 ENCOUNTER — Telehealth: Payer: Self-pay | Admitting: Gynecologic Oncology

## 2023-12-30 DIAGNOSIS — F411 Generalized anxiety disorder: Secondary | ICD-10-CM | POA: Diagnosis not present

## 2023-12-30 DIAGNOSIS — N83209 Unspecified ovarian cyst, unspecified side: Secondary | ICD-10-CM

## 2023-12-30 NOTE — Telephone Encounter (Signed)
 Called patient to discuss follow-up plan. She prefers to repeat labs (HE4 and CA-125 ) rather than ultrasound in 1-2 months (what I had recommended when she came for initial consult). Depending on results of these lab tests, we will discuss any recommendations for follow-up imaging.  I asked my office to cancel scheduled ultrasound in late July and instead to get the patient scheduled for repeat labs in late July or early August.  Wiley Hanger MD Gynecologic Oncology

## 2023-12-30 NOTE — Telephone Encounter (Signed)
 Per secure chat from Dr Orvil Bland... cancelled the US  for 7/28 and scheduled lab appointment for 8/6 at 9:30am after speaking with patient.  Patient confirmed .[name]

## 2024-01-13 DIAGNOSIS — M150 Primary generalized (osteo)arthritis: Secondary | ICD-10-CM | POA: Diagnosis not present

## 2024-01-13 DIAGNOSIS — M810 Age-related osteoporosis without current pathological fracture: Secondary | ICD-10-CM | POA: Diagnosis not present

## 2024-01-13 DIAGNOSIS — M0589 Other rheumatoid arthritis with rheumatoid factor of multiple sites: Secondary | ICD-10-CM | POA: Diagnosis not present

## 2024-01-13 DIAGNOSIS — M3500 Sicca syndrome, unspecified: Secondary | ICD-10-CM | POA: Diagnosis not present

## 2024-01-13 DIAGNOSIS — Z79899 Other long term (current) drug therapy: Secondary | ICD-10-CM | POA: Diagnosis not present

## 2024-01-13 DIAGNOSIS — M4125 Other idiopathic scoliosis, thoracolumbar region: Secondary | ICD-10-CM | POA: Diagnosis not present

## 2024-02-07 ENCOUNTER — Ambulatory Visit (HOSPITAL_COMMUNITY)

## 2024-02-16 ENCOUNTER — Inpatient Hospital Stay: Attending: Gynecologic Oncology

## 2024-02-16 DIAGNOSIS — R971 Elevated cancer antigen 125 [CA 125]: Secondary | ICD-10-CM | POA: Diagnosis not present

## 2024-02-16 DIAGNOSIS — N83209 Unspecified ovarian cyst, unspecified side: Secondary | ICD-10-CM | POA: Diagnosis not present

## 2024-02-17 ENCOUNTER — Encounter: Payer: Self-pay | Admitting: Gynecologic Oncology

## 2024-02-17 LAB — MISC LABCORP TEST (SEND OUT): Labcorp test code: 81610

## 2024-02-18 ENCOUNTER — Telehealth: Payer: Self-pay

## 2024-02-18 NOTE — Telephone Encounter (Signed)
 Spoke with patient and scheduled her US  for October 14th at 9:45am... gave her all information.. patient confirmed and understood.

## 2024-02-25 DIAGNOSIS — L089 Local infection of the skin and subcutaneous tissue, unspecified: Secondary | ICD-10-CM | POA: Diagnosis not present

## 2024-02-25 DIAGNOSIS — L720 Epidermal cyst: Secondary | ICD-10-CM | POA: Diagnosis not present

## 2024-02-28 DIAGNOSIS — L7211 Pilar cyst: Secondary | ICD-10-CM | POA: Diagnosis not present

## 2024-03-09 DIAGNOSIS — M0589 Other rheumatoid arthritis with rheumatoid factor of multiple sites: Secondary | ICD-10-CM | POA: Diagnosis not present

## 2024-03-22 DIAGNOSIS — M19112 Post-traumatic osteoarthritis, left shoulder: Secondary | ICD-10-CM | POA: Diagnosis not present

## 2024-03-28 ENCOUNTER — Telehealth: Payer: Self-pay

## 2024-03-28 NOTE — Telephone Encounter (Signed)
   Pre-operative Risk Assessment    Patient Name: [name]  DOB: 1945/12/19 MRN: [id number]   Date of last office visit: 08/16/23 Date of next office visit: Not scheduled   Request for Surgical Clearance    Procedure:  Left Reverse Total Shoulder Arthroplasty  Date of Surgery:  Clearance 04/11/24                                Surgeon:  Eva MICAEL Herring, MD Surgeon's Group or Practice Name:  [facility]  Phone number:  [phone] Fax number:  [phone]   Type of Clearance Requested:   - Medical    Type of Anesthesia:  Choice   Additional requests/questions:    [name]   03/28/2024, 10:06 AM

## 2024-03-28 NOTE — Telephone Encounter (Signed)
  Patient Consent for Virtual Visit         [name] has provided verbal consent on 03/28/2024 for a virtual visit (video or telephone).  Appointment scheduled for 03/29/2024 @ 3:20pm. Med req and consent are complete. Call patient at [phone].    CONSENT FOR VIRTUAL VISIT FOR:  [name]  By participating in this virtual visit I agree to the following:  I hereby voluntarily request, consent and authorize [facility] and its employed or contracted physicians, physician assistants, nurse practitioners or other licensed health care professionals (the Practitioner), to provide me with telemedicine health care services (the "Services) as deemed necessary by the treating Practitioner. I acknowledge and consent to receive the Services by the Practitioner via telemedicine. I understand that the telemedicine visit will involve communicating with the Practitioner through live audiovisual communication technology and the disclosure of certain medical information by electronic transmission. I acknowledge that I have been given the opportunity to request an in-person assessment or other available alternative prior to the telemedicine visit and am voluntarily participating in the telemedicine visit.  I understand that I have the right to withhold or withdraw my consent to the use of telemedicine in the course of my care at any time, without affecting my right to future care or treatment, and that the Practitioner or I may terminate the telemedicine visit at any time. I understand that I have the right to inspect all information obtained and/or recorded in the course of the telemedicine visit and may receive copies of available information for a reasonable fee.  I understand that some of the potential risks of receiving the Services via telemedicine include:  Delay or interruption in medical evaluation due to technological equipment failure or disruption; Information transmitted may not  be sufficient (e.g. poor resolution of images) to allow for appropriate medical decision making by the Practitioner; and/or  In rare instances, security protocols could fail, causing a breach of personal health information.  Furthermore, I acknowledge that it is my responsibility to provide information about my medical history, conditions and care that is complete and accurate to the best of my ability. I acknowledge that Practitioner's advice, recommendations, and/or decision may be based on factors not within their control, such as incomplete or inaccurate data provided by me or distortions of diagnostic images or specimens that may result from electronic transmissions. I understand that the practice of medicine is not an exact science and that Practitioner makes no warranties or guarantees regarding treatment outcomes. I acknowledge that a copy of this consent can be made available to me via my patient portal [facility] MyChart), or I can request a printed copy by calling the office of [facility].    I understand that my insurance will be billed for this visit.   I have read or had this consent read to me. I understand the contents of this consent, which adequately explains the benefits and risks of the Services being provided via telemedicine.  I have been provided ample opportunity to ask questions regarding this consent and the Services and have had my questions answered to my satisfaction. I give my informed consent for the services to be provided through the use of telemedicine in my medical care

## 2024-03-28 NOTE — Telephone Encounter (Signed)
   Name: [name]  DOB: 1946-03-10  MRN: [id number]  Primary Cardiologist: Lynwood Schilling, MD   Preoperative team, please contact this patient and set up a phone call appointment for further preoperative risk assessment. Please obtain consent and complete medication review. Thank you for your help.  I confirm that guidance regarding antiplatelet and oral anticoagulation therapy has been completed and, if necessary, noted below. None requested.  I also confirmed the patient resides in the state of [place] . As per [facility] Medical Board telemedicine laws, the patient must reside in the state in which the provider is licensed.   Dariella Gillihan E Jaquin Coy, NP 03/28/2024, 10:50 AM [facility]

## 2024-03-28 NOTE — Telephone Encounter (Signed)
 Appointment scheduled for 03/29/2024 @ 3:20pm. Med req and consent are complete. Call patient at [phone]

## 2024-03-29 ENCOUNTER — Ambulatory Visit: Attending: Cardiology

## 2024-03-29 DIAGNOSIS — Z01810 Encounter for preprocedural cardiovascular examination: Secondary | ICD-10-CM | POA: Diagnosis not present

## 2024-03-29 NOTE — Progress Notes (Signed)
 Virtual Visit via Telephone Note   Because of LOA IDLER co-morbid illnesses, she is at least at moderate risk for complications without adequate follow up.  This format is felt to be most appropriate for this patient at this time.  Due to technical limitations with video connection (technology), today's appointment will be conducted as an audio only telehealth visit, and MIESHA BACHMANN verbally agreed to proceed in this manner.   All issues noted in this document were discussed and addressed.  No physical exam could be performed with this format.  Evaluation Performed:  Preoperative cardiovascular risk assessment _____________   Date:  03/29/2024   Patient ID:  [name], DOB 1945-10-07, MRN [id number] Patient Location:  Home Provider location:   Office  Primary Care Provider:  Clarice Nottingham, MD Primary Cardiologist:  Lynwood Schilling, MD  Chief Complaint / Patient Profile  78 y.o. 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 History of Present Illness  [name] is a 78 y.o. female who presents via audio/video conferencing for a telehealth visit today.  Pt was last seen in cardiology clinic on 08/16/2023 by Aline Door, PA.  At that time CYRA SPADER was doing well.  The patient is now pending procedure as outlined above. Since her last visit, she has remained stable from a cardiac standpoint. Today she denies chest pain, shortness of breath, lower extremity edema, fatigue, palpitations, melena, hematuria, hemoptysis, diaphoresis, weakness, presyncope, syncope, orthopnea, and PND.  She is able to achieve greater than 4 METS of activity. Past Medical  History    Past Medical History:  Diagnosis Date   Arthritis    Complication of anesthesia    Cough variant asthma 12/29/2017   12/29/2017  Walked RA x 3 laps @ 185 ft each stopped due to  End of study,fast pace, no sob or desat   Spirometry 12/29/2017  FEV1 1.93 (88%)  Ratio 71 p saba w/in 4 h prior - Allergy  profile 12/29/2017 >  Eos 0.6 /  IgE  22 RAST pos  cat > dog and dust  - FENO 12/29/2017  =   36 - 12/29/2017  After extensive coaching inhaler device  effectiveness =    75% > try symbicort  80 2bid - PFT's  01/28/18  FEV1   Depression    Hypothyroidism    Osteomyelitis (HCC)    Mandible, right lower   PONV (postoperative nausea and vomiting)    Past Surgical History:  Procedure Laterality Date   HAND SURGERY Bilateral    Synovial tissue removed   toncillectomy Bilateral 1952   TOOTH EXTRACTION Right 04/13/2015   Then had to have fluid drained from abcess x 2   TUBAL LIGATION Bilateral 1980    Allergies  Allergies  Allergen Reactions   Remicade [Infliximab] Shortness Of Breath   Amoxicillin -Pot Clavulanate Itching, Photosensitivity and Rash   Sucralfate  Rash    PER PT    Home Medications    Prior to Admission medications   Medication Sig Start Date End Date Taking? Authorizing Provider  albuterol  (VENTOLIN  HFA) 108 (90 Base) MCG/ACT inhaler Inhale 2 puffs into the lungs every 6 (six) hours as needed for wheezing or shortness of  breath.    [provider]  ALPRAZolam  (XANAX ) 0.5 MG tablet Take 0.5 mg by mouth 3 (three) times daily as needed for anxiety.    [provider]  budesonide -formoterol  (SYMBICORT ) 160-4.5 MCG/ACT inhaler Inhale 2 puffs into the lungs 2 (two) times daily.    [provider]  Calcium  Carb-Cholecalciferol (CALCIUM +D3 PO) Take 1 capsule by mouth every morning. Calcium  1200 mg, vitamin D 25 mcg/1000 units    [provider]  carvedilol  (COREG ) 3.125 MG tablet Take 1 tablet (3.125 mg total) by mouth 2 (two) times daily.  06/24/23 09/22/23  Lavona Agent, MD  Cholecalciferol (VITAMIN D) 50 MCG (2000 UT) CAPS Take 2,000 Units by mouth every morning.    [provider]  denosumab  (PROLIA ) 60 MG/ML SOSY injection Inject 60 mg into the skin every 6 (six) months.    [provider]  ibuprofen (ADVIL,MOTRIN) 200 MG tablet Take 400 mg by mouth every 6 (six) hours as needed for fever or headache (pain).    [provider]  levocetirizine (XYZAL) 5 MG tablet Take 5 mg by mouth every evening.    [provider]  levothyroxine  (SYNTHROID , LEVOTHROID) 75 MCG tablet Take 75 mcg by mouth daily before breakfast.    [provider]  polyvinyl alcohol (LIQUIFILM TEARS) 1.4 % ophthalmic solution Place 1 drop into both eyes daily as needed for dry eyes.    [provider]  rosuvastatin  (CRESTOR ) 5 MG tablet Take 5 mg by mouth every other day. 02/08/21   [provider]    Physical Exam  Vital Signs:  [name] does not have vital signs available for review today. Given telephonic nature of communication, physical exam is limited. AAOx3. NAD. Normal affect.  Speech and respirations are unlabored. Accessory Clinical Findings  None Assessment & Plan  1.  Preoperative Cardiovascular Risk Assessment: Ms. Yager's perioperative risk of a major cardiac event is 0.9% according to the Revised Cardiac Risk Index (RCRI).  Therefore, she is at low risk for perioperative complications.   Her functional capacity is good at 6.61 METs according to the Duke Activity Status Index (DASI). Recommendations: According to ACC/AHA guidelines, no further cardiovascular testing needed.  The patient may proceed to surgery at acceptable risk.   Antiplatelet and/or Anticoagulation Recommendations: None requested.   The patient was advised that if she develops new symptoms prior to surgery to contact our office to arrange for a follow-up visit, and she verbalized understanding.  A copy of  this note will be routed to requesting surgeon.  Time:   Today, I have spent 10 minutes with the patient with telehealth technology discussing medical history, symptoms, and management plan.    Ramia Sidney D Morene Cecilio, NP  03/29/2024, 4:19 PM

## 2024-03-30 DIAGNOSIS — F411 Generalized anxiety disorder: Secondary | ICD-10-CM | POA: Diagnosis not present

## 2024-03-31 DIAGNOSIS — M19112 Post-traumatic osteoarthritis, left shoulder: Secondary | ICD-10-CM | POA: Diagnosis not present

## 2024-04-03 DIAGNOSIS — K760 Fatty (change of) liver, not elsewhere classified: Secondary | ICD-10-CM | POA: Diagnosis not present

## 2024-04-03 DIAGNOSIS — E039 Hypothyroidism, unspecified: Secondary | ICD-10-CM | POA: Diagnosis not present

## 2024-04-03 DIAGNOSIS — I2510 Atherosclerotic heart disease of native coronary artery without angina pectoris: Secondary | ICD-10-CM | POA: Diagnosis not present

## 2024-04-03 DIAGNOSIS — I5022 Chronic systolic (congestive) heart failure: Secondary | ICD-10-CM | POA: Diagnosis not present

## 2024-04-03 DIAGNOSIS — F17211 Nicotine dependence, cigarettes, in remission: Secondary | ICD-10-CM | POA: Diagnosis not present

## 2024-04-03 DIAGNOSIS — M150 Primary generalized (osteo)arthritis: Secondary | ICD-10-CM | POA: Diagnosis not present

## 2024-04-03 DIAGNOSIS — F339 Major depressive disorder, recurrent, unspecified: Secondary | ICD-10-CM | POA: Diagnosis not present

## 2024-04-03 DIAGNOSIS — I10 Essential (primary) hypertension: Secondary | ICD-10-CM | POA: Diagnosis not present

## 2024-04-03 DIAGNOSIS — M0589 Other rheumatoid arthritis with rheumatoid factor of multiple sites: Secondary | ICD-10-CM | POA: Diagnosis not present

## 2024-04-03 DIAGNOSIS — Z01818 Encounter for other preprocedural examination: Secondary | ICD-10-CM | POA: Diagnosis not present

## 2024-04-25 ENCOUNTER — Other Ambulatory Visit (HOSPITAL_COMMUNITY)

## 2024-04-25 DIAGNOSIS — M19112 Post-traumatic osteoarthritis, left shoulder: Secondary | ICD-10-CM | POA: Diagnosis not present

## 2024-04-25 DIAGNOSIS — G8918 Other acute postprocedural pain: Secondary | ICD-10-CM | POA: Diagnosis not present

## 2024-04-27 DIAGNOSIS — M810 Age-related osteoporosis without current pathological fracture: Secondary | ICD-10-CM | POA: Diagnosis not present

## 2024-05-15 DIAGNOSIS — M810 Age-related osteoporosis without current pathological fracture: Secondary | ICD-10-CM | POA: Diagnosis not present

## 2024-05-15 DIAGNOSIS — Z79899 Other long term (current) drug therapy: Secondary | ICD-10-CM | POA: Diagnosis not present

## 2024-05-15 DIAGNOSIS — M0589 Other rheumatoid arthritis with rheumatoid factor of multiple sites: Secondary | ICD-10-CM | POA: Diagnosis not present

## 2024-05-15 DIAGNOSIS — Z23 Encounter for immunization: Secondary | ICD-10-CM | POA: Diagnosis not present

## 2024-05-15 DIAGNOSIS — M3500 Sicca syndrome, unspecified: Secondary | ICD-10-CM | POA: Diagnosis not present

## 2024-05-15 DIAGNOSIS — M4125 Other idiopathic scoliosis, thoracolumbar region: Secondary | ICD-10-CM | POA: Diagnosis not present

## 2024-05-15 DIAGNOSIS — M150 Primary generalized (osteo)arthritis: Secondary | ICD-10-CM | POA: Diagnosis not present

## 2024-05-23 DIAGNOSIS — S161XXA Strain of muscle, fascia and tendon at neck level, initial encounter: Secondary | ICD-10-CM | POA: Diagnosis not present

## 2024-05-23 DIAGNOSIS — M62838 Other muscle spasm: Secondary | ICD-10-CM | POA: Diagnosis not present

## 2024-05-24 DIAGNOSIS — M0589 Other rheumatoid arthritis with rheumatoid factor of multiple sites: Secondary | ICD-10-CM | POA: Diagnosis not present

## 2024-06-12 DIAGNOSIS — M25612 Stiffness of left shoulder, not elsewhere classified: Secondary | ICD-10-CM | POA: Diagnosis not present

## 2024-06-12 DIAGNOSIS — Z96612 Presence of left artificial shoulder joint: Secondary | ICD-10-CM | POA: Diagnosis not present

## 2024-06-14 DIAGNOSIS — M25612 Stiffness of left shoulder, not elsewhere classified: Secondary | ICD-10-CM | POA: Diagnosis not present

## 2024-06-14 DIAGNOSIS — Z96612 Presence of left artificial shoulder joint: Secondary | ICD-10-CM | POA: Diagnosis not present

## 2024-06-20 DIAGNOSIS — M25612 Stiffness of left shoulder, not elsewhere classified: Secondary | ICD-10-CM | POA: Diagnosis not present

## 2024-06-20 DIAGNOSIS — Z96612 Presence of left artificial shoulder joint: Secondary | ICD-10-CM | POA: Diagnosis not present

## 2024-06-21 DIAGNOSIS — Z96612 Presence of left artificial shoulder joint: Secondary | ICD-10-CM | POA: Diagnosis not present

## 2024-06-22 DIAGNOSIS — M25612 Stiffness of left shoulder, not elsewhere classified: Secondary | ICD-10-CM | POA: Diagnosis not present

## 2024-06-22 DIAGNOSIS — Z96612 Presence of left artificial shoulder joint: Secondary | ICD-10-CM | POA: Diagnosis not present

## 2024-06-26 DIAGNOSIS — M25612 Stiffness of left shoulder, not elsewhere classified: Secondary | ICD-10-CM | POA: Diagnosis not present

## 2024-06-26 DIAGNOSIS — Z96612 Presence of left artificial shoulder joint: Secondary | ICD-10-CM | POA: Diagnosis not present

## 2024-08-04 ENCOUNTER — Telehealth: Payer: Self-pay | Admitting: Oncology

## 2024-08-04 NOTE — Telephone Encounter (Signed)
 Left a message regarding US  that was canceled in October.  Requested a return call.

## 2024-08-08 ENCOUNTER — Telehealth: Payer: Self-pay | Admitting: Oncology

## 2024-08-08 NOTE — Telephone Encounter (Signed)
 Called [name] to check in.  She said she was doing fine.  Asked if she would like to reschedule her pelvic ultrasound and she said she would like to hold off. Advised I would let Dr. Viktoria know.

## 2024-08-13 ENCOUNTER — Other Ambulatory Visit: Payer: Self-pay | Admitting: Cardiology

## 2024-08-28 ENCOUNTER — Inpatient Hospital Stay: Admitting: Hematology and Oncology

## 2024-08-28 ENCOUNTER — Inpatient Hospital Stay

## 2024-08-31 ENCOUNTER — Ambulatory Visit: Admitting: Cardiology
